# Patient Record
Sex: Female | Born: 1985 | Race: White | Hispanic: No | Marital: Single | Smoking: Never smoker
Health system: Southern US, Community
[De-identification: ages and names within clinical notes are randomized; demographics above are authoritative.]

## PROBLEM LIST (undated history)

## (undated) DIAGNOSIS — F99 Mental disorder, not otherwise specified: Secondary | ICD-10-CM

## (undated) DIAGNOSIS — O139 Gestational [pregnancy-induced] hypertension without significant proteinuria, unspecified trimester: Secondary | ICD-10-CM

## (undated) DIAGNOSIS — R569 Unspecified convulsions: Secondary | ICD-10-CM

## (undated) DIAGNOSIS — R87629 Unspecified abnormal cytological findings in specimens from vagina: Secondary | ICD-10-CM

## (undated) DIAGNOSIS — O24419 Gestational diabetes mellitus in pregnancy, unspecified control: Secondary | ICD-10-CM

## (undated) DIAGNOSIS — G43909 Migraine, unspecified, not intractable, without status migrainosus: Secondary | ICD-10-CM

## (undated) DIAGNOSIS — F32A Depression, unspecified: Secondary | ICD-10-CM

## (undated) DIAGNOSIS — F419 Anxiety disorder, unspecified: Secondary | ICD-10-CM

## (undated) DIAGNOSIS — G4730 Sleep apnea, unspecified: Secondary | ICD-10-CM

## (undated) DIAGNOSIS — N200 Calculus of kidney: Secondary | ICD-10-CM

## (undated) DIAGNOSIS — M549 Dorsalgia, unspecified: Secondary | ICD-10-CM

## (undated) HISTORY — DX: Mental disorder, not otherwise specified: F99

## (undated) HISTORY — DX: Depression, unspecified: F32.A

## (undated) HISTORY — DX: Sleep apnea, unspecified: G47.30

## (undated) HISTORY — DX: Migraine, unspecified, not intractable, without status migrainosus: G43.909

## (undated) HISTORY — DX: Unspecified abnormal cytological findings in specimens from vagina: R87.629

## (undated) HISTORY — DX: Gestational diabetes mellitus in pregnancy, unspecified control: O24.419

## (undated) HISTORY — DX: Anxiety disorder, unspecified: F41.9

## (undated) HISTORY — DX: Gestational (pregnancy-induced) hypertension without significant proteinuria, unspecified trimester: O13.9

---

## 2016-10-08 ENCOUNTER — Emergency Department (HOSPITAL_COMMUNITY)
Admission: EM | Admit: 2016-10-08 | Discharge: 2016-10-09 | Disposition: A | Discharge status: Home / Self Care | Payer: BC Managed Care – PPO | Attending: Emergency Medicine | Admitting: Emergency Medicine

## 2016-10-08 ENCOUNTER — Encounter (HOSPITAL_COMMUNITY): Payer: Self-pay | Admitting: Emergency Medicine

## 2016-10-08 DIAGNOSIS — R51 Headache: Secondary | ICD-10-CM | POA: Diagnosis not present

## 2016-10-08 DIAGNOSIS — R112 Nausea with vomiting, unspecified: Secondary | ICD-10-CM | POA: Insufficient documentation

## 2016-10-08 DIAGNOSIS — R197 Diarrhea, unspecified: Secondary | ICD-10-CM | POA: Diagnosis not present

## 2016-10-08 DIAGNOSIS — Z79899 Other long term (current) drug therapy: Secondary | ICD-10-CM | POA: Insufficient documentation

## 2016-10-08 LAB — URINE MICROSCOPIC-ADD ON

## 2016-10-08 LAB — COMPREHENSIVE METABOLIC PANEL
ALT: 24 U/L (ref 14–54)
AST: 22 U/L (ref 15–41)
Albumin: 3.1 g/dL — ABNORMAL LOW (ref 3.5–5.0)
Alkaline Phosphatase: 60 U/L (ref 38–126)
Anion gap: 8 (ref 5–15)
BUN: 16 mg/dL (ref 6–20)
CHLORIDE: 99 mmol/L — AB (ref 101–111)
CO2: 27 mmol/L (ref 22–32)
CREATININE: 0.94 mg/dL (ref 0.44–1.00)
Calcium: 8.1 mg/dL — ABNORMAL LOW (ref 8.9–10.3)
GFR calc non Af Amer: >60 mL/min (ref >60)
Glucose, Bld: 114 mg/dL — ABNORMAL HIGH (ref 65–99)
POTASSIUM: 4.1 mmol/L (ref 3.5–5.1)
SODIUM: 134 mmol/L — AB (ref 135–145)
Total Bilirubin: 0.7 mg/dL (ref 0.3–1.2)
Total Protein: 6.9 g/dL (ref 6.5–8.1)

## 2016-10-08 LAB — URINALYSIS, ROUTINE W REFLEX MICROSCOPIC
Bilirubin Urine: NEGATIVE
GLUCOSE, UA: NEGATIVE mg/dL
HGB URINE DIPSTICK: NEGATIVE
Ketones, ur: NEGATIVE mg/dL
LEUKOCYTES UA: NEGATIVE
Nitrite: POSITIVE — AB
PH: 6.5 (ref 5.0–8.0)
PROTEIN: NEGATIVE mg/dL
Specific Gravity, Urine: 1.015 (ref 1.005–1.030)

## 2016-10-08 LAB — LIPASE, BLOOD: LIPASE: 16 U/L (ref 11–51)

## 2016-10-08 LAB — PREGNANCY, URINE: Preg Test, Ur: NEGATIVE

## 2016-10-08 LAB — CBC
HEMATOCRIT: 44.5 % (ref 36.0–46.0)
Hemoglobin: 14.5 g/dL (ref 12.0–15.0)
MCH: 28.7 pg (ref 26.0–34.0)
MCHC: 32.6 g/dL (ref 30.0–36.0)
MCV: 88.1 fL (ref 78.0–100.0)
PLATELETS: 232 10*3/uL (ref 150–400)
RBC: 5.05 MIL/uL (ref 3.87–5.11)
RDW: 13.6 % (ref 11.5–15.5)
WBC: 5.9 10*3/uL (ref 4.0–10.5)

## 2016-10-08 MED ORDER — ONDANSETRON HCL 4 MG/2ML IJ SOLN
4.0000 mg | Freq: Once | INTRAMUSCULAR | Status: AC | PRN
  Administered 2016-10-08: 4 mg via INTRAVENOUS

## 2016-10-08 MED ORDER — ONDANSETRON HCL 4 MG/2ML IJ SOLN
INTRAMUSCULAR | Status: AC
  Administered 2016-10-08: 4 mg via INTRAVENOUS
  Filled 2016-10-08: qty 2

## 2016-10-08 MED ORDER — SODIUM CHLORIDE 0.9 % IV BOLUS (SEPSIS)
1000.0000 mL | Freq: Once | INTRAVENOUS | Status: AC
  Administered 2016-10-08: 1000 mL via INTRAVENOUS

## 2016-10-08 NOTE — ED Provider Notes (Signed)
[facility]-EMERGENCY DEPT Provider Note   CSN: [id number] Arrival date & time: 10/08/16  1919  By signing my name below, I, Angelica Crawford, attest that this documentation has been prepared under the direction and in the presence of Angelica Crawford, Scribe. 10/08/2016. 10:16 PM.  History   Chief Complaint Chief Complaint  Patient presents with  . Emesis   The history is provided by the patient. No language interpreter was used.  Emesis   This is a new problem. The current episode started 12 to 24 hours ago. The problem occurs continuously. The problem has not changed since onset.Maximum temperature: Subjective. The fever has been present for less than 1 day. Associated symptoms include chills, diarrhea, a fever, headaches and sweats. Pertinent negatives include no cough. Risk factors include ill contacts.    HPI Comments:  Angelica Crawford is an otherwise healthy 30 y.o. female who presents to the Emergency Department complaining of sudden-onset constant unchanged episodes of vomiting beginning 16 hours ago. Pt states that she woke up early this morning and began vomiting. She states that she tried going to work but was unable to stay due to her continued vomiting. Pt reports that she went home and went to vomit when she passed out and hit her head on the toilet seat. She has tried rest with no relief. Pt reports associated nausea, diarrhea, chills, fever, fatigue, change in appetite, and headache. She denies any bloody stool, dysuria, rhinorrhea, congestion, cough, or visual disturbance. 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    History reviewed. No pertinent past medical history.  There are no active problems to display for this patient.   History reviewed. No pertinent surgical history.  OB History    No data available       Home Medications    Prior to Admission medications   Medication Sig Start Date End Date Taking?  Authorizing Provider  LORazepam (ATIVAN) 0.5 MG tablet Take 0.5 mg by mouth daily as needed for anxiety.  09/12/16  Yes Historical Provider, Crawford    Family History Family History  Problem Relation Age of Onset  . Multiple sclerosis Mother   . Cancer Mother     Social History Social History  Substance Use Topics  . Smoking status: Never Smoker  . Smokeless tobacco: Never Used  . Alcohol use No     Allergies   Penicillins and Sulfa antibiotics   Review of Systems Review of Systems  Constitutional: Positive for appetite change, chills, fatigue and fever.  HENT: Negative for congestion and rhinorrhea.   Eyes: Negative for visual disturbance.  Respiratory: Negative for cough.   Gastrointestinal: Positive for diarrhea, nausea and vomiting. Negative for blood in stool.  Genitourinary: Negative for dysuria.  Neurological: Positive for headaches.  All other systems reviewed and are negative.    Physical Exam Updated Vital Signs BP 117/63 (BP Location: Left Arm)   Pulse 98   Temp 99.1 F (37.3 C) (Oral)   Resp 19   Ht 5\' 5"  (1.651 m)   Wt (!) 376 lb (170.6 kg)   LMP 10/03/2016   SpO2 95%   BMI 62.57 kg/m   Physical Exam  Constitutional: She appears well-developed and well-nourished. No distress.  Obesity  HENT:  Head: Normocephalic and atraumatic.  Eyes: Conjunctivae are normal.  Neck: Neck supple.  Cardiovascular: Normal rate and regular rhythm.   No murmur heard. Pulmonary/Chest: Effort normal and breath sounds normal. No respiratory distress.  Abdominal: Soft. There is no tenderness.  Musculoskeletal: She exhibits no edema.  Neurological: She is alert.  Skin: Skin is warm and dry.  Psychiatric: She has a normal mood and affect.  Nursing note and vitals reviewed.    ED Treatments / Results  DIAGNOSTIC STUDIES:  Oxygen Saturation is 95% on RA, adequate by my interpretation.    COORDINATION OF CARE:  10:24 PM Discussed treatment plan with pt at bedside  which includes nausea medication and pt agreed to plan.  Labs (all labs ordered are listed, but only abnormal results are displayed) Labs Reviewed  COMPREHENSIVE METABOLIC PANEL - Abnormal; Notable for the following:       Result Value   Sodium 134 (*)    Chloride 99 (*)    Glucose, Bld 114 (*)    Calcium 8.1 (*)    Albumin 3.1 (*)    All other components within normal limits  URINALYSIS, ROUTINE W REFLEX MICROSCOPIC (NOT AT [facility]) - Abnormal; Notable for the following:    APPearance HAZY (*)    Nitrite POSITIVE (*)    All other components within normal limits  URINE MICROSCOPIC-ADD ON - Abnormal; Notable for the following:    Squamous Epithelial / LPF 0-5 (*)    Bacteria, UA MANY (*)    All other components within normal limits  LIPASE, BLOOD  CBC  PREGNANCY, URINE    EKG  EKG Interpretation None       Radiology No results found.  Procedures Procedures (including critical care time)  Medications Ordered in ED Medications  ondansetron (ZOFRAN) injection 4 mg (4 mg Intravenous Given 10/08/16 2103)  sodium chloride 0.9 % bolus 1,000 mL (0 mLs Intravenous Stopped 10/08/16 2322)  fosfomycin (MONUROL) packet 3 g (3 g Oral Given 10/09/16 0109)  ondansetron (ZOFRAN) injection 4 mg (4 mg Intravenous Given 10/09/16 0014)     Initial Impression / Assessment and Plan / ED Course  I have reviewed the triage vital signs and the nursing notes.  Pertinent labs & imaging results that were available during my care of the patient were reviewed by me and considered in my medical decision making (see chart for details).  Clinical Course  Value Comment By Time  Pregnancy, urine (Reviewed) Angelica Brimhristopher J Prerana Strayer, Crawford 10/25 1249     Angelica Crawford is an otherwise healthy 30 y.o. female who presents to the Emergency Department complaining of Nausea, vomiting, and diarrhea.  History and exam are seen above.  Patient exam was grossly unremarkable. No abdominal tenderness. Lungs  clear.  Patient symptom still to be related to likely viral gastroenteritis however, lab testing obtained for dehydration and possible UTI given her frequent urination.  Urine revealed evidence of UTI. Normal kidney function in a leukocytosis, do not feel patient has pyelo.   Patient given fosfomycin for UTI. Patient had resolution in nausea and vomiting with Zofran.  Patient felt much better after fluids.  Patient given a prescription for Zofran and will follow up with CP in several days. Patient can return precautions for worsened dehydration or other symptoms. Patient had no other questions or concerns and was discharged in good condition.  Final Clinical Impressions(s) / ED Diagnoses   Final diagnoses:  Nausea vomiting and diarrhea    New Prescriptions Discharge Medication List as of 10/09/2016 12:04 AM    START taking these medications   Details  ondansetron (ZOFRAN) 4 MG tablet Take 1 tablet (4 mg total) by mouth every 8 (eight) hours as needed for  nausea or vomiting., Starting Wed 10/09/2016, Print       I personally performed the services described in this documentation, which was scribed in my presence. The recorded information has been reviewed and is accurate.  Clinical Impression: 1. Nausea vomiting and diarrhea     Disposition: Discharge  Condition: Good  I have discussed the results, Dx and Tx plan with the pt(& family if present). He/she/they expressed understanding and agree(s) with the plan. Discharge instructions discussed at great length. Strict return precautions discussed and pt &/or family have verbalized understanding of the instructions. No further questions at time of discharge.    Discharge Medication List as of 10/09/2016 12:04 AM    START taking these medications   Details  ondansetron (ZOFRAN) 4 MG tablet Take 1 tablet (4 mg total) by mouth every 8 (eight) hours as needed for nausea or vomiting., Starting Wed 10/09/2016, Print         Follow Up: [facility] [address] [place] [address] [phone]         Angelica Crawford 10/09/16 1251

## 2016-10-08 NOTE — ED Triage Notes (Signed)
Pt reports emesis, diarrhea and fever that started today. Pt states she "passes out when she throws up."

## 2016-10-08 NOTE — ED Notes (Signed)
MD at bedside. 

## 2016-10-09 MED ORDER — FOSFOMYCIN TROMETHAMINE 3 G PO PACK
PACK | ORAL | Status: AC
  Filled 2016-10-09: qty 3

## 2016-10-09 MED ORDER — FOSFOMYCIN TROMETHAMINE 3 G PO PACK
3.0000 g | PACK | Freq: Once | ORAL | Status: AC
  Administered 2016-10-09: 3 g via ORAL
  Filled 2016-10-09: qty 3

## 2016-10-09 MED ORDER — ONDANSETRON HCL 4 MG PO TABS: 4.0000 mg | ORAL_TABLET | Freq: Three times a day (TID) | ORAL | 0 refills | Status: DC | PRN

## 2016-10-09 MED ORDER — ONDANSETRON HCL 4 MG/2ML IJ SOLN
4.0000 mg | Freq: Once | INTRAMUSCULAR | Status: AC
  Administered 2016-10-09: 4 mg via INTRAVENOUS
  Filled 2016-10-09: qty 2

## 2016-10-09 NOTE — ED Notes (Signed)
Pt updated on delay that we are waiting on med to come from pharmacy

## 2016-10-09 NOTE — ED Notes (Signed)
Pt is waiting on abx prior to d/c

## 2017-10-06 ENCOUNTER — Other Ambulatory Visit (HOSPITAL_COMMUNITY)
Admission: RE | Admit: 2017-10-06 | Discharge: 2017-10-06 | Disposition: A | Discharge status: Home / Self Care | Payer: BC Managed Care – PPO | Source: Ambulatory Visit | Attending: Family Medicine | Admitting: Family Medicine

## 2017-10-06 ENCOUNTER — Other Ambulatory Visit: Payer: Self-pay | Admitting: Family Medicine

## 2017-10-06 DIAGNOSIS — Z124 Encounter for screening for malignant neoplasm of cervix: Secondary | ICD-10-CM | POA: Diagnosis not present

## 2017-10-08 LAB — CYTOLOGY - PAP
Chlamydia: NEGATIVE
DIAGNOSIS: NEGATIVE
HPV: NOT DETECTED
NEISSERIA GONORRHEA: NEGATIVE

## 2017-12-23 ENCOUNTER — Other Ambulatory Visit: Payer: Self-pay | Admitting: Family Medicine

## 2017-12-23 DIAGNOSIS — Z1231 Encounter for screening mammogram for malignant neoplasm of breast: Secondary | ICD-10-CM

## 2018-02-09 ENCOUNTER — Ambulatory Visit
Admission: RE | Admit: 2018-02-09 | Discharge: 2018-02-09 | Disposition: A | Discharge status: Home / Self Care | Payer: BC Managed Care – PPO | Source: Ambulatory Visit | Attending: Family Medicine | Admitting: Family Medicine

## 2018-02-09 DIAGNOSIS — Z1231 Encounter for screening mammogram for malignant neoplasm of breast: Secondary | ICD-10-CM

## 2018-02-11 ENCOUNTER — Other Ambulatory Visit: Payer: Self-pay | Admitting: Family Medicine

## 2018-02-11 DIAGNOSIS — R928 Other abnormal and inconclusive findings on diagnostic imaging of breast: Secondary | ICD-10-CM

## 2018-02-16 ENCOUNTER — Ambulatory Visit
Admission: RE | Admit: 2018-02-16 | Discharge: 2018-02-16 | Disposition: A | Discharge status: Home / Self Care | Payer: BC Managed Care – PPO | Source: Ambulatory Visit | Attending: Family Medicine | Admitting: Family Medicine

## 2018-02-16 DIAGNOSIS — R928 Other abnormal and inconclusive findings on diagnostic imaging of breast: Secondary | ICD-10-CM

## 2018-05-05 ENCOUNTER — Other Ambulatory Visit: Payer: Self-pay

## 2018-05-05 ENCOUNTER — Encounter (HOSPITAL_COMMUNITY): Payer: Self-pay | Admitting: Emergency Medicine

## 2018-05-05 ENCOUNTER — Emergency Department (HOSPITAL_COMMUNITY)
Admission: EM | Admit: 2018-05-05 | Discharge: 2018-05-05 | Disposition: A | Discharge status: Home / Self Care | Payer: BC Managed Care – PPO | Attending: Emergency Medicine | Admitting: Emergency Medicine

## 2018-05-05 ENCOUNTER — Emergency Department (HOSPITAL_COMMUNITY): Payer: BC Managed Care – PPO

## 2018-05-05 DIAGNOSIS — M461 Sacroiliitis, not elsewhere classified: Secondary | ICD-10-CM

## 2018-05-05 DIAGNOSIS — M62830 Muscle spasm of back: Secondary | ICD-10-CM | POA: Diagnosis not present

## 2018-05-05 DIAGNOSIS — M545 Low back pain: Secondary | ICD-10-CM | POA: Diagnosis present

## 2018-05-05 LAB — POC URINE PREG, ED: PREG TEST UR: NEGATIVE

## 2018-05-05 MED ORDER — PROMETHAZINE HCL 12.5 MG PO TABS
12.5000 mg | ORAL_TABLET | Freq: Once | ORAL | Status: AC
  Administered 2018-05-05: 12.5 mg via ORAL
  Filled 2018-05-05: qty 1

## 2018-05-05 MED ORDER — HYDROCODONE-ACETAMINOPHEN 5-325 MG PO TABS
2.0000 | ORAL_TABLET | Freq: Once | ORAL | Status: AC
Start: 2018-05-05 — End: 2018-05-05
  Administered 2018-05-05: 2 via ORAL
  Filled 2018-05-05: qty 2

## 2018-05-05 MED ORDER — HYDROCODONE-ACETAMINOPHEN 5-325 MG PO TABS: 1.0000 | ORAL_TABLET | ORAL | 0 refills | Status: DC | PRN

## 2018-05-05 MED ORDER — METHOCARBAMOL 500 MG PO TABS
500.0000 mg | ORAL_TABLET | Freq: Once | ORAL | Status: AC
  Administered 2018-05-05: 500 mg via ORAL
  Filled 2018-05-05: qty 1

## 2018-05-05 MED ORDER — HYDROMORPHONE HCL 1 MG/ML IJ SOLN
1.0000 mg | Freq: Once | INTRAMUSCULAR | Status: AC
  Administered 2018-05-05: 1 mg via INTRAMUSCULAR
  Filled 2018-05-05: qty 1

## 2018-05-05 MED ORDER — DEXAMETHASONE 4 MG PO TABS: 4.0000 mg | ORAL_TABLET | Freq: Two times a day (BID) | ORAL | 0 refills | Status: DC

## 2018-05-05 MED ORDER — ONDANSETRON HCL 4 MG PO TABS
4.0000 mg | ORAL_TABLET | Freq: Once | ORAL | Status: AC
  Administered 2018-05-05: 4 mg via ORAL
  Filled 2018-05-05: qty 1

## 2018-05-05 MED ORDER — DIAZEPAM 5 MG PO TABS: 5.0000 mg | ORAL_TABLET | Freq: Three times a day (TID) | ORAL | 0 refills | Status: DC

## 2018-05-05 MED ORDER — DICLOFENAC SODIUM 75 MG PO TBEC: 75.0000 mg | DELAYED_RELEASE_TABLET | Freq: Two times a day (BID) | ORAL | 0 refills | Status: DC

## 2018-05-05 MED ORDER — PREDNISONE 20 MG PO TABS
40.0000 mg | ORAL_TABLET | Freq: Once | ORAL | Status: AC
  Administered 2018-05-05: 40 mg via ORAL
  Filled 2018-05-05: qty 2

## 2018-05-05 MED ORDER — DIAZEPAM 5 MG PO TABS
10.0000 mg | ORAL_TABLET | Freq: Once | ORAL | Status: AC
  Administered 2018-05-05: 10 mg via ORAL
  Filled 2018-05-05: qty 2

## 2018-05-05 NOTE — ED Triage Notes (Signed)
Pt c/o of lower back pain. Denies any new injuries or urinary symptoms.

## 2018-05-05 NOTE — ED Provider Notes (Signed)
[facility] EMERGENCY DEPARTMENT Provider Note   CSN: [id number] Arrival date & time: 05/05/18  1816     History   Chief Complaint Chief Complaint  Patient presents with  . Back Pain    HPI Angelica Crawford is a 32 y.o. female.  Patient is a 32 year old female who presents to the emergency department with a complaint of severe back pain.  The patient states that on yesterday she bent over to pick up a marker.  She had a immediate pain of the lower back.  She was seen at the emergency department at [facility], had a plain x-ray film.  She was told that they did not see any acute abnormality on the plain film.  The patient was placed on a muscle relaxer and an anti-inflammatory medication.  The patient states that the pain is gotten progressively worse throughout the day.  She says now the pain is nearly unbearable.  She has pain with even little movements.  She has not had any loss of bowel or bladder function.  She denies any problem with sensation in the saddle area or the private area.  She presents now for assistance with her pain and for additional evaluation concerning her back.     History reviewed. No pertinent past medical history.  There are no active problems to display for this patient.   History reviewed. No pertinent surgical history.   OB History   None      Home Medications    Prior to Admission medications   Medication Sig Start Date End Date Taking? Authorizing Provider  dexamethasone (DECADRON) 4 MG tablet Take 1 tablet (4 mg total) by mouth 2 (two) times daily with a meal. 05/05/18   Ivery Quale, PA-C  diazepam (VALIUM) 5 MG tablet Take 1 tablet (5 mg total) by mouth 3 (three) times daily. 05/05/18   Ivery Quale, PA-C  diclofenac (VOLTAREN) 75 MG EC tablet Take 1 tablet (75 mg total) by mouth 2 (two) times daily. 05/05/18   Ivery Quale, PA-C  HYDROcodone-acetaminophen (NORCO/VICODIN) 5-325 MG tablet Take 1 tablet by mouth every 4 (four) hours  as needed. 05/05/18   Ivery Quale, PA-C  LORazepam (ATIVAN) 0.5 MG tablet Take 0.5 mg by mouth daily as needed for anxiety.  09/12/16   [provider]  ondansetron (ZOFRAN) 4 MG tablet Take 1 tablet (4 mg total) by mouth every 8 (eight) hours as needed for nausea or vomiting. 10/09/16   Tegeler, Canary Brim, MD    Family History Family History  Problem Relation Age of Onset  . Multiple sclerosis Mother   . Cancer Mother   . Breast cancer Mother     Social History Social History   Tobacco Use  . Smoking status: Never Smoker  . Smokeless tobacco: Never Used  Substance Use Topics  . Alcohol use: No  . Drug use: No     Allergies   Penicillins and Sulfa antibiotics   Review of Systems Review of Systems  Constitutional: Negative for activity change.       All ROS Neg except as noted in HPI  HENT: Negative for nosebleeds.   Eyes: Negative for photophobia and discharge.  Respiratory: Negative for cough, shortness of breath and wheezing.   Cardiovascular: Negative for chest pain and palpitations.  Gastrointestinal: Negative for abdominal pain and blood in stool.  Genitourinary: Negative for dysuria, frequency and hematuria.  Musculoskeletal: Positive for back pain. Negative for arthralgias and neck pain.  Skin: Negative.   Neurological:  Negative for dizziness, seizures and speech difficulty.  Psychiatric/Behavioral: Negative for confusion and hallucinations.     Physical Exam Updated Vital Signs BP (!) 132/94 (BP Location: Right Arm)   Pulse 91   Temp 98.6 F (37 C) (Oral)   Resp 17   Ht  (1.676 m)   Wt (!) 141.5 kg (312 lb)   SpO2 100%   BMI 50.36 kg/m   Physical Exam  Constitutional: She is oriented to person, place, and time. She appears well-developed and well-nourished.  Non-toxic appearance.  HENT:  Head: Normocephalic.  Right Ear: Tympanic membrane and external ear normal.  Left Ear: Tympanic membrane and external ear normal.  Eyes:  Pupils are equal, round, and reactive to light. EOM and lids are normal.  Neck: Normal range of motion. Neck supple. Carotid bruit is not present.  Cardiovascular: Normal rate, regular rhythm, normal heart sounds, intact distal pulses and normal pulses.  Pulmonary/Chest: Breath sounds normal. No respiratory distress.  Abdominal: Soft. Bowel sounds are normal. There is no tenderness. There is no guarding.  Musculoskeletal: Normal range of motion.       Lumbar back: She exhibits pain and spasm.       Back:  Lymphadenopathy:       Head (right side): No submandibular adenopathy present.       Head (left side): No submandibular adenopathy present.    She has no cervical adenopathy.  Neurological: She is alert and oriented to person, place, and time. She has normal strength. No cranial nerve deficit or sensory deficit.  Skin: Skin is warm and dry.  Psychiatric: She has a normal mood and affect. Her speech is normal.  Nursing note and vitals reviewed.    ED Treatments / Results  Labs (all labs ordered are listed, but only abnormal results are displayed) Labs Reviewed  POC URINE PREG, ED    EKG None  Radiology Ct Lumbar Spine Wo Contrast  Result Date: 05/05/2018 CLINICAL DATA:  Patient bent over yesterday and has had back pain since. Pain is all over but worse involving the left buttocks and radiating down the front of both thighs. EXAM: CT LUMBAR SPINE WITHOUT CONTRAST TECHNIQUE: Multidetector CT imaging of the lumbar spine was performed without intravenous contrast administration. Multiplanar CT image reconstructions were also generated. COMPARISON:  Radiographs from 05/04/2018 FINDINGS: Segmentation: 5 lumbar type vertebrae. Alignment: Normal. Vertebrae: No acute fracture or focal pathologic process. No pars defects or listhesis. Sclerosis of both SI joints compatible sacroiliitis. Paraspinal and other soft tissues: No retroperitoneal adenopathy. Aorta is normal in caliber. No  nephrolithiasis nor hydroureteronephrosis. Disc levels: T11-12: Negative for disc herniation, canal stenosis and significant foraminal stenosis. T12-L1:  Negative L1-L2:  Negative L2-L3:  Negative L3-L4: Minimal central disc bulge without significant foraminal or central canal stenosis. L4-L5: Minimal central disc bulge without significant foraminal or central canal stenosis. L5-S1:  Negative IMPRESSION: 1. Sclerosis about both sacroiliac joints suspicious for changes of sacroiliitis and/or osteoarthritis. 2. Mild central disc bulges L3-4 and L4-5 without focal disc herniation or canal stenosis. 3. No acute lumbar fracture or suspicious osseous lesions. Electronically Signed   By: Tollie Eth M.D.   On: 05/05/2018 20:49    Procedures Procedures (including critical care time)  Medications Ordered in ED Medications  HYDROmorphone (DILAUDID) injection 1 mg (1 mg Intramuscular Given 05/05/18 2002)  methocarbamol (ROBAXIN) tablet 500 mg (500 mg Oral Given 05/05/18 2002)  promethazine (PHENERGAN) tablet 12.5 mg (12.5 mg Oral Given 05/05/18 2002)  diazepam (VALIUM)  tablet 10 mg (10 mg Oral Given 05/05/18 2123)  predniSONE (DELTASONE) tablet 40 mg (40 mg Oral Given 05/05/18 2123)  HYDROcodone-acetaminophen (NORCO/VICODIN) 5-325 MG per tablet 2 tablet (2 tablets Oral Given 05/05/18 2123)  ondansetron (ZOFRAN) tablet 4 mg (4 mg Oral Given 05/05/18 2123)     Initial Impression / Assessment and Plan / ED Course  I have reviewed the triage vital signs and the nursing notes.  Pertinent labs & imaging results that were available during my care of the patient were reviewed by me and considered in my medical decision making (see chart for details).     Final Clinical Impressions(s) / ED Diagnoses MDm  Vital signs reviewed.  Patient having increasing lower back pain.  No gross neurologic deficit appreciated on examination.  Patient has pain with walking and with most movement.  Patient reports she had a plain  x-ray film done at another emergency facility on yesterday and findings were noted.  Patient received a CT scan of the lumbar spine.  This is suggest sacroiliitis, and degenerative disc disease involving C3-C4 and C4-C5.  I discussed the findings with the patient in terms which she understands.  Pain is improving, but not resolved after IM pain medication.  Recheck reveals no gross neurologic deficits appreciated.  Prescription for Valium 3 times daily for spasm pain, Decadron and diclofenac 2 times daily with food, and Norco every 4 hours as needed for severe pain given to the patient.  Patient is to follow-up with orthopedics.  Questions were answered.  Feel that it is safe for the patient to be discharged home.   Final diagnoses:  Bilateral sacroiliitis (HCC)  Muscle spasm of back    ED Discharge Orders        Ordered    diazepam (VALIUM) 5 MG tablet  3 times daily     05/05/18 2126    HYDROcodone-acetaminophen (NORCO/VICODIN) 5-325 MG tablet  Every 4 hours PRN     05/05/18 2126    dexamethasone (DECADRON) 4 MG tablet  2 times daily with meals     05/05/18 2126    diclofenac (VOLTAREN) 75 MG EC tablet  2 times daily     05/05/18 2126       Ivery Quale, PA-C 05/06/18 Rondel Jumbo, MD 05/06/18 0040

## 2018-05-05 NOTE — Discharge Instructions (Addendum)
Your CT scan suggest sacroiliitis or inflammation of the sacroiliac joints.  You have a bulging disc at the L3-L4, and L4-L5 area.  No herniated disc appreciated on the CT scan. Please call Dr Carola Frost for orthopedic evaluation of your back. Your exam suggest muscle spasm of the lower back area.  Please use a heating pad to the area.  Please rest her back is much as possible.  Use Decadron and diclofenac 2 times daily with food.  Use Valium 3 times daily for the spasm pains.  May use Norco for more severe pain.  Norco and Valium may cause drowsiness.  Please use these medications with caution.  Please see Dr. Marcello Fennel or a member of his team as soon as possible concerning your back pain.

## 2018-05-09 ENCOUNTER — Other Ambulatory Visit: Payer: Self-pay

## 2018-05-09 ENCOUNTER — Emergency Department (HOSPITAL_COMMUNITY)
Admission: EM | Admit: 2018-05-09 | Discharge: 2018-05-09 | Disposition: A | Discharge status: Home / Self Care | Payer: No Typology Code available for payment source | Attending: Emergency Medicine | Admitting: Emergency Medicine

## 2018-05-09 ENCOUNTER — Encounter (HOSPITAL_COMMUNITY): Payer: Self-pay | Admitting: Emergency Medicine

## 2018-05-09 DIAGNOSIS — M5442 Lumbago with sciatica, left side: Secondary | ICD-10-CM | POA: Diagnosis not present

## 2018-05-09 DIAGNOSIS — M545 Low back pain: Secondary | ICD-10-CM | POA: Diagnosis present

## 2018-05-09 DIAGNOSIS — M5441 Lumbago with sciatica, right side: Secondary | ICD-10-CM | POA: Insufficient documentation

## 2018-05-09 MED ORDER — DIAZEPAM 5 MG PO TABS
5.0000 mg | ORAL_TABLET | Freq: Once | ORAL | Status: AC
  Administered 2018-05-09: 5 mg via ORAL
  Filled 2018-05-09: qty 1

## 2018-05-09 MED ORDER — HYDROMORPHONE HCL 1 MG/ML IJ SOLN
1.0000 mg | Freq: Once | INTRAMUSCULAR | Status: AC
  Administered 2018-05-09: 1 mg via INTRAMUSCULAR
  Filled 2018-05-09: qty 1

## 2018-05-09 MED ORDER — HYDROCODONE-ACETAMINOPHEN 5-325 MG PO TABS: 1.0000 | ORAL_TABLET | ORAL | 0 refills | Status: DC | PRN

## 2018-05-09 MED ORDER — DIAZEPAM 5 MG PO TABS: 5.0000 mg | ORAL_TABLET | Freq: Three times a day (TID) | ORAL | 0 refills | Status: DC | PRN

## 2018-05-09 MED ORDER — ONDANSETRON 4 MG PO TBDP
4.0000 mg | ORAL_TABLET | Freq: Once | ORAL | Status: AC
  Administered 2018-05-09: 4 mg via ORAL
  Filled 2018-05-09: qty 1

## 2018-05-09 NOTE — ED Triage Notes (Signed)
Pt reports continued lower back pain that worsens with movement. Pt evaluated a few days ago for same complaint.

## 2018-05-09 NOTE — Discharge Instructions (Addendum)
Use the medicines as directed.  Do not drive within 4 hours of taking hydrocodone as this will make you drowsy as will valium.  Avoid lifting,  Bending,  Twisting or any other activity that worsens your pain over the next week.  Apply a heating pad for 20 minutes 3-4 times daily which can help your back heal and help reduce any muscle spasm.  You should get rechecked if your symptoms are not better over the next 5 days,  Or you develop increased pain,  Weakness in your leg(s) or loss of bladder or bowel function - these are symptoms of a worse injury.

## 2018-05-09 NOTE — ED Provider Notes (Signed)
[facility] EMERGENCY DEPARTMENT Provider Note   CSN: [id number] Arrival date & time: 05/09/18  1516     History   Chief Complaint Chief Complaint  Patient presents with  . Back Pain    HPI Angelica Crawford is a 32 y.o. female presenting with request for pain control for her lower back.  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  She underwent mri at the ortho office yesterday and has followup in 3 days there for further management.  She placed on hydrocodone, valium and voltaren  Moderate improvement of pain when here, but has run out of these medicines.  She has radiation of pain into her anterior bilateral upper thighs.  There has been no weakness or numbness in the lower extremities and no urinary or bowel retention or incontinence.  Patient does not have a history of cancer or IVDU.     The history is provided by the patient.    History reviewed. No pertinent past medical history.  There are no active problems to display for this patient.   History reviewed. No pertinent surgical history.   OB History   None      Home Medications    Prior to Admission medications   Medication Sig Start Date End Date Taking? Authorizing Provider  dexamethasone (DECADRON) 4 MG tablet Take 1 tablet (4 mg total) by mouth 2 (two) times daily with a meal. 05/05/18   Ivery Quale, PA-C  diazepam (VALIUM) 5 MG tablet Take 1 tablet (5 mg total) by mouth every 8 (eight) hours as needed for muscle spasms. 05/09/18   Burgess Amor, PA-C  diclofenac (VOLTAREN) 75 MG EC tablet Take 1 tablet (75 mg total) by mouth 2 (two) times daily. 05/05/18   Ivery Quale, PA-C  HYDROcodone-acetaminophen (NORCO/VICODIN) 5-325 MG tablet Take 1-2 tablets by mouth every 4 (four) hours as needed. 05/09/18   Burgess Amor, PA-C  LORazepam (ATIVAN) 0.5 MG tablet  Take 0.5 mg by mouth daily as needed for anxiety.  09/12/16   [provider]  ondansetron (ZOFRAN) 4 MG tablet Take 1 tablet (4 mg total) by mouth every 8 (eight) hours as needed for nausea or vomiting. 10/09/16   Tegeler, Canary Brim, MD    Family History Family History  Problem Relation Age of Onset  . Multiple sclerosis Mother   . Cancer Mother   . Breast cancer Mother     Social History Social History   Tobacco Use  . Smoking status: Never Smoker  . Smokeless tobacco: Never Used  Substance Use Topics  . Alcohol use: No  . Drug use: No     Allergies   Penicillins and Sulfa antibiotics   Review of Systems Review of Systems  Constitutional: Negative for fever.  Respiratory: Negative for shortness of breath.   Cardiovascular: Negative for chest pain and leg swelling.  Gastrointestinal: Negative for abdominal distention, abdominal pain and constipation.  Genitourinary: Negative for difficulty urinating, dysuria, flank pain, frequency and urgency.  Musculoskeletal: Positive for back pain. Negative for gait problem and joint swelling.  Skin: Negative for rash.  Neurological: Negative for weakness and numbness.     Physical Exam Updated Vital Signs BP (!) 129/92 (BP Location: Right Arm)   Pulse (!) 118   Temp 98.5 F (36.9 C) (Oral)   Resp 20  Ht  (1.651 m)   Wt (!) 141.5 kg (312 lb)   LMP 05/03/2018 (Exact Date)   SpO2 100%   BMI 51.92 kg/m   Physical Exam  Constitutional: She appears well-developed and well-nourished.  HENT:  Head: Normocephalic.  Eyes: Conjunctivae are normal.  Neck: Normal range of motion. Neck supple.  Cardiovascular: Normal rate and intact distal pulses.  Pedal pulses normal.  Pulmonary/Chest: Effort normal.  Abdominal: Soft. Bowel sounds are normal. She exhibits no distension and no mass.  Musculoskeletal: Normal range of motion. She exhibits no edema.       Lumbar back: She exhibits tenderness. She exhibits no  swelling, no edema and no spasm.       Back:  Neurological: She is alert. She has normal strength. She displays no atrophy and no tremor. No sensory deficit. Gait normal.  Reflex Scores:      Patellar reflexes are 2+ on the right side and 2+ on the left side.      Achilles reflexes are 2+ on the right side and 2+ on the left side. No strength deficit noted in hip and knee flexor and extensor muscle groups.  Ankle flexion and extension intact.  Skin: Skin is warm and dry.  Psychiatric: She has a normal mood and affect.  Nursing note and vitals reviewed.    ED Treatments / Results  Labs (all labs ordered are listed, but only abnormal results are displayed) Labs Reviewed - No data to display  EKG None  Radiology No results found.  Procedures Procedures (including critical care time)  Medications Ordered in ED Medications  diazepam (VALIUM) tablet 5 mg (5 mg Oral Given 05/09/18 1617)  HYDROmorphone (DILAUDID) injection 1 mg (1 mg Intramuscular Given 05/09/18 1618)  ondansetron (ZOFRAN-ODT) disintegrating tablet 4 mg (4 mg Oral Given 05/09/18 1617)     Initial Impression / Assessment and Plan / ED Course  I have reviewed the triage vital signs and the nursing notes.  Pertinent labs & imaging results that were available during my care of the patient were reviewed by me and considered in my medical decision making (see chart for details).     Patient walking very slowly with forward flexion at waist upon first arrival.  Tearful.  Given dilaudid injection, valium tablet with moderate pain relief. She was prescribed a few more hydrocodone tablets, valium until she can be seen by her orthopedist in 3 days. Advised to ask her orthopedist for further pain management as needed.   No neuro deficit on exam or by history to suggest emergent or surgical presentation.  Also discussed worsened sx that should prompt immediate re-evaluation including distal weakness, bowel/bladder  retention/incontinence.        Final Clinical Impressions(s) / ED Diagnoses   Final diagnoses:  Acute bilateral low back pain with bilateral sciatica    ED Discharge Orders        Ordered    diazepam (VALIUM) 5 MG tablet  Every 8 hours PRN     05/09/18 1718    HYDROcodone-acetaminophen (NORCO/VICODIN) 5-325 MG tablet  Every 4 hours PRN     05/09/18 1718       Burgess Amor, PA-C 05/10/18 1719    Derwood Kaplan, MD 05/11/18 0023

## 2018-05-16 ENCOUNTER — Other Ambulatory Visit: Payer: Self-pay

## 2018-05-16 ENCOUNTER — Encounter (HOSPITAL_COMMUNITY): Payer: Self-pay | Admitting: Emergency Medicine

## 2018-05-16 ENCOUNTER — Emergency Department (HOSPITAL_COMMUNITY)
Admission: EM | Admit: 2018-05-16 | Discharge: 2018-05-16 | Disposition: A | Discharge status: Home / Self Care | Payer: No Typology Code available for payment source | Attending: Emergency Medicine | Admitting: Emergency Medicine

## 2018-05-16 DIAGNOSIS — Z79899 Other long term (current) drug therapy: Secondary | ICD-10-CM | POA: Diagnosis not present

## 2018-05-16 DIAGNOSIS — M545 Low back pain: Secondary | ICD-10-CM | POA: Diagnosis present

## 2018-05-16 DIAGNOSIS — M5441 Lumbago with sciatica, right side: Secondary | ICD-10-CM | POA: Insufficient documentation

## 2018-05-16 DIAGNOSIS — M5442 Lumbago with sciatica, left side: Secondary | ICD-10-CM | POA: Insufficient documentation

## 2018-05-16 MED ORDER — HYDROMORPHONE HCL 1 MG/ML IJ SOLN
1.0000 mg | Freq: Once | INTRAMUSCULAR | Status: AC
  Administered 2018-05-16: 1 mg via INTRAMUSCULAR
  Filled 2018-05-16: qty 1

## 2018-05-16 NOTE — ED Notes (Signed)
Pt went to rehab on Thursday  Extreme pain yesterday-  Did not call rehab  Did not call ortho  Here today tearful with pain

## 2018-05-16 NOTE — ED Notes (Signed)
Pt when told she is getting injection  Burst out in laughter and smiles  She is no longer tearful, and asks how much med she is getting  When told dilaudid 1 mg she is almost elated and continues with her laughter and smiles  Pt education: Narcotics can lead to addiction as well as constipation and need to follow with her oncall physician for pain management

## 2018-05-16 NOTE — ED Triage Notes (Signed)
Patient reports continuing back pain since May. No problems with bowel or bladder.

## 2018-05-16 NOTE — Discharge Instructions (Signed)
Alternate ice and heat to your lower back.  Follow-up with your orthopedic provider on Monday.

## 2018-05-17 ENCOUNTER — Emergency Department (HOSPITAL_COMMUNITY)
Admission: EM | Admit: 2018-05-17 | Discharge: 2018-05-17 | Disposition: A | Discharge status: Home / Self Care | Payer: No Typology Code available for payment source | Attending: Emergency Medicine | Admitting: Emergency Medicine

## 2018-05-17 ENCOUNTER — Encounter (HOSPITAL_COMMUNITY): Payer: Self-pay | Admitting: *Deleted

## 2018-05-17 DIAGNOSIS — M545 Low back pain: Secondary | ICD-10-CM | POA: Diagnosis not present

## 2018-05-17 DIAGNOSIS — Z5321 Procedure and treatment not carried out due to patient leaving prior to being seen by health care provider: Secondary | ICD-10-CM | POA: Insufficient documentation

## 2018-05-17 HISTORY — DX: Dorsalgia, unspecified: M54.9

## 2018-05-17 NOTE — ED Notes (Signed)
Pt seen in FT yesterday by TT, PA  She was tearful until asking and hearing that she would receive dilaudid  She then became elated and stated her pain decreased less than 5 minutes after injection

## 2018-05-17 NOTE — ED Provider Notes (Signed)
[facility] EMERGENCY DEPARTMENT Provider Note   CSN: [id number] Arrival date & time: 05/16/18  1230     History   Chief Complaint Chief Complaint  Patient presents with  . Back Pain    HPI Angelica Crawford is a 32 y.o. female.  HPI  Angelica Crawford is a 32 y.o. female who presents to the Emergency Department complaining of persistent low back pain and pain radiating into both hips and thighs and down the left leg to the level of her foot.  She has been evaluated here two times and by her orthopedic provider for same.  Pain began after bending over the pick up a marker on the floor and felt a sharp pain to her lower back in mid May.  Imaging revealed disc bulges at L3-4 and 4-5.  States she has completed a course of steroids, muscle relaxer and hydrocodone, her orthopedic provider initiated PT which she states has made her pain worse.  She denies abdominal pain, fever, numbness or weakness of the lower extremities, urine of bowel changes.    Past Medical History:  Diagnosis Date  . Back pain     There are no active problems to display for this patient.   History reviewed. No pertinent surgical history.   OB History   None      Home Medications    Prior to Admission medications   Medication Sig Start Date End Date Taking? Authorizing Provider  diazepam (VALIUM) 5 MG tablet Take 1 tablet (5 mg total) by mouth every 8 (eight) hours as needed for muscle spasms. 05/09/18  Yes Idol, Raynelle FanningJulie, PA-C  HYDROcodone-acetaminophen (NORCO/VICODIN) 5-325 MG tablet Take 1-2 tablets by mouth every 4 (four) hours as needed. 05/09/18  Yes Idol, Raynelle FanningJulie, PA-C  LORazepam (ATIVAN) 0.5 MG tablet Take 0.5 mg by mouth daily as needed for anxiety.  09/12/16  Yes [provider]  dexamethasone (DECADRON) 4 MG tablet Take 1 tablet (4 mg total) by mouth 2 (two) times daily with a meal. Patient not taking: Reported on 05/16/2018 05/05/18   Ivery QualeBryant, Hobson, PA-C  diclofenac (VOLTAREN) 75 MG EC tablet  Take 1 tablet (75 mg total) by mouth 2 (two) times daily. Patient not taking: Reported on 05/16/2018 05/05/18   Ivery QualeBryant, Hobson, PA-C    Family History Family History  Problem Relation Age of Onset  . Multiple sclerosis Mother   . Cancer Mother   . Breast cancer Mother     Social History Social History   Tobacco Use  . Smoking status: Never Smoker  . Smokeless tobacco: Never Used  Substance Use Topics  . Alcohol use: No  . Drug use: No     Allergies   Penicillins and Sulfa antibiotics   Review of Systems Review of Systems  Constitutional: Negative for fever.  Respiratory: Negative for shortness of breath.   Gastrointestinal: Negative for abdominal pain, constipation and vomiting.  Genitourinary: Negative for decreased urine volume, difficulty urinating, dysuria, flank pain and hematuria.  Musculoskeletal: Positive for back pain. Negative for joint swelling.  Skin: Negative for rash.  Neurological: Negative for weakness and numbness.  All other systems reviewed and are negative.    Physical Exam Updated Vital Signs BP 132/70 (BP Location: Right Arm)   Pulse (!) 113   Temp 97.9 F (36.6 C) (Oral)   Resp 18   Ht 5\' 5"  (1.651 m)   Wt (!) 141.5 kg (312 lb)   LMP 05/03/2018 (Exact Date)   SpO2 98%  BMI 51.92 kg/m   Physical Exam  Constitutional: She is oriented to person, place, and time. She appears well-developed and well-nourished. No distress.  HENT:  Head: Normocephalic.  Neck: Normal range of motion. Neck supple.  Cardiovascular: Normal rate, regular rhythm and intact distal pulses.  DP pulses are strong and palpable bilaterally  Pulmonary/Chest: Effort normal and breath sounds normal. No respiratory distress.  Abdominal: Soft. She exhibits no distension. There is no tenderness.  Musculoskeletal: She exhibits tenderness. She exhibits no edema.       Lumbar back: She exhibits tenderness and pain. She exhibits normal range of motion, no swelling, no  deformity, no laceration and normal pulse.  ttp of the  Lower lumbar spine and bilateral lumbar paraspinal muscles.  Pt has 5/5 strength against resistance of bilateral lower extremities. Hip flexors and extensors intact.       Neurological: She is alert and oriented to person, place, and time. She has normal strength. No sensory deficit. She exhibits normal muscle tone. Coordination and gait normal.  Reflex Scores:      Patellar reflexes are 2+ on the right side and 2+ on the left side.      Achilles reflexes are 2+ on the right side and 2+ on the left side. Skin: Skin is warm and dry. Capillary refill takes less than 2 seconds. No rash noted.  Psychiatric: She has a normal mood and affect.  Nursing note and vitals reviewed.    ED Treatments / Results  Labs (all labs ordered are listed, but only abnormal results are displayed) Labs Reviewed - No data to display  EKG None  Radiology No results found.  Procedures Procedures (including critical care time)  Medications Ordered in ED Medications  HYDROmorphone (DILAUDID) injection 1 mg (1 mg Intramuscular Given 05/16/18 1420)     Initial Impression / Assessment and Plan / ED Course  I have reviewed the triage vital signs and the nursing notes.  Pertinent labs & imaging results that were available during my care of the patient were reviewed by me and considered in my medical decision making (see chart for details).     Pt with low back pain.  Requesting stronger pain medications.  Two previous ED visits for same, also seen 3 days ago by orthopedics for same.  Reviewed on the database and found to have been prescribed # 75 hydrocodone 5/325 mg within 10 days.    Reviewed CT imaging from previous visit and confirmed disc bulges w/o herniation.  She is ambulatory, gait steady,  No focal neuro deficits. No concerning sx's for cauda equina or infectious process.  Will address pain here, but advised pt that additional prescriptions are  not indicated.  She verbalized understanding and agrees to plan.    Final Clinical Impressions(s) / ED Diagnoses   Final diagnoses:  Acute bilateral low back pain with bilateral sciatica    ED Discharge Orders    None       Pauline Aus, PA-C 05/17/18 1958    Samuel Jester, DO 05/20/18 1235

## 2018-05-17 NOTE — ED Triage Notes (Signed)
Pt informed registration that pt is leaving due to wait time.

## 2018-05-17 NOTE — ED Triage Notes (Signed)
Pt here for continued lower back pain.  States medications at home are not helping.

## 2018-06-11 ENCOUNTER — Encounter (HOSPITAL_COMMUNITY): Payer: Self-pay | Admitting: Emergency Medicine

## 2018-06-11 ENCOUNTER — Emergency Department (HOSPITAL_COMMUNITY)
Admission: EM | Admit: 2018-06-11 | Discharge: 2018-06-11 | Disposition: A | Discharge status: Home / Self Care | Payer: No Typology Code available for payment source | Attending: Emergency Medicine | Admitting: Emergency Medicine

## 2018-06-11 ENCOUNTER — Other Ambulatory Visit: Payer: Self-pay

## 2018-06-11 DIAGNOSIS — Z79899 Other long term (current) drug therapy: Secondary | ICD-10-CM | POA: Diagnosis not present

## 2018-06-11 DIAGNOSIS — M5442 Lumbago with sciatica, left side: Secondary | ICD-10-CM | POA: Insufficient documentation

## 2018-06-11 DIAGNOSIS — G8929 Other chronic pain: Secondary | ICD-10-CM

## 2018-06-11 DIAGNOSIS — M545 Low back pain: Secondary | ICD-10-CM | POA: Diagnosis present

## 2018-06-11 MED ORDER — PREDNISONE 10 MG PO TABS
60.0000 mg | ORAL_TABLET | Freq: Once | ORAL | Status: AC
  Administered 2018-06-11: 60 mg via ORAL
  Filled 2018-06-11: qty 1

## 2018-06-11 MED ORDER — HYDROMORPHONE HCL 1 MG/ML IJ SOLN
1.0000 mg | Freq: Once | INTRAMUSCULAR | Status: AC
  Administered 2018-06-11: 1 mg via INTRAMUSCULAR
  Filled 2018-06-11: qty 1

## 2018-06-11 MED ORDER — CYCLOBENZAPRINE HCL 10 MG PO TABS: 10.0000 mg | ORAL_TABLET | Freq: Three times a day (TID) | ORAL | 0 refills | Status: DC | PRN

## 2018-06-11 MED ORDER — PREDNISONE 10 MG PO TABS: ORAL_TABLET | ORAL | 0 refills | Status: DC

## 2018-06-11 NOTE — ED Triage Notes (Signed)
Pt c/o of lower back pain that has worsened today. Denies any urinary symptoms

## 2018-06-11 NOTE — ED Provider Notes (Signed)
[facility] EMERGENCY DEPARTMENT Provider Note   CSN: [id number] Arrival date & time: 06/11/18  1700     History   Chief Complaint Chief Complaint  Patient presents with  . Back Pain    HPI Angelica Crawford is a 32 y.o. female.  HPI   Angelica Crawford is a 32 y.o. female with history of low back pain, presents to the Emergency Department complaining of increasing pain to her lower back for 1 day.  She is currently under orthopedic care for pain to her lower back that is now radiating down her left leg for several weeks.  She states she is currently undergoing physical therapy and had her last session yesterday when she felt an increased pain across her lower back.  She states the pain is similar to previous, but increased in severity.  She is tried taking her prescribed pain medication without relief.  She denies recent fall, abdominal pain, fever or chills, urine or bowel changes, and numbness or weakness of the lower extremities.  She states she is scheduled for a surgical procedure to her lower back in mid July.   Past Medical History:  Diagnosis Date  . Back pain     There are no active problems to display for this patient.   History reviewed. No pertinent surgical history.   OB History   None      Home Medications    Prior to Admission medications   Medication Sig Start Date End Date Taking? Authorizing Provider  dexamethasone (DECADRON) 4 MG tablet Take 1 tablet (4 mg total) by mouth 2 (two) times daily with a meal. Patient not taking: Reported on 05/16/2018 05/05/18   Ivery Quale, PA-C  diazepam (VALIUM) 5 MG tablet Take 1 tablet (5 mg total) by mouth every 8 (eight) hours as needed for muscle spasms. 05/09/18   Burgess Amor, PA-C  diclofenac (VOLTAREN) 75 MG EC tablet Take 1 tablet (75 mg total) by mouth 2 (two) times daily. Patient not taking: Reported on 05/16/2018 05/05/18   Ivery Quale, PA-C  HYDROcodone-acetaminophen (NORCO/VICODIN) 5-325 MG tablet Take  1-2 tablets by mouth every 4 (four) hours as needed. 05/09/18   Burgess Amor, PA-C  LORazepam (ATIVAN) 0.5 MG tablet Take 0.5 mg by mouth daily as needed for anxiety.  09/12/16   [provider]    Family History Family History  Problem Relation Age of Onset  . Multiple sclerosis Mother   . Cancer Mother   . Breast cancer Mother     Social History Social History   Tobacco Use  . Smoking status: Never Smoker  . Smokeless tobacco: Never Used  Substance Use Topics  . Alcohol use: No  . Drug use: No     Allergies   Penicillins and Sulfa antibiotics   Review of Systems Review of Systems  Constitutional: Negative for fever.  Respiratory: Negative for shortness of breath.   Gastrointestinal: Negative for abdominal pain, constipation and vomiting.  Genitourinary: Negative for decreased urine volume, difficulty urinating, dysuria and flank pain.  Musculoskeletal: Positive for back pain. Negative for joint swelling and neck pain.  Skin: Negative for rash.  Neurological: Negative for weakness and numbness.  All other systems reviewed and are negative.    Physical Exam Updated Vital Signs BP (!) 151/94 (BP Location: Right Arm)   Pulse 81   Temp 97.9 F (36.6 C) (Temporal)   Resp 17   Ht 5\' 5"  (1.651 m)   Wt (!) 141.5 kg (312 lb)  SpO2 99%   BMI 51.92 kg/m   Physical Exam  Constitutional: She is oriented to person, place, and time. She appears well-developed and well-nourished. No distress.  HENT:  Head: Normocephalic and atraumatic.  Neck: Normal range of motion. Neck supple.  Cardiovascular: Normal rate, regular rhythm and intact distal pulses.  DP pulses are strong and palpable bilaterally  Pulmonary/Chest: Effort normal and breath sounds normal. No respiratory distress.  Abdominal: Soft. She exhibits no distension. There is no tenderness.  Musculoskeletal: She exhibits tenderness. She exhibits no edema.       Lumbar back: She exhibits tenderness and pain.  She exhibits normal range of motion, no swelling, no deformity, no laceration and normal pulse.  ttp of the lower lumbar spine and bilateral paraspinal muscles with left > right.  Pt has 5/5 strength against resistance of bilateral lower extremities.  Hip flexors and extensors are intact   Neurological: She is alert and oriented to person, place, and time. She has normal strength. No sensory deficit. She exhibits normal muscle tone. Coordination and gait normal.  Reflex Scores:      Patellar reflexes are 2+ on the right side and 2+ on the left side.      Achilles reflexes are 2+ on the right side and 2+ on the left side. Skin: Skin is warm and dry. Capillary refill takes less than 2 seconds. No rash noted.  Nursing note and vitals reviewed.    ED Treatments / Results  Labs (all labs ordered are listed, but only abnormal results are displayed) Labs Reviewed - No data to display  EKG None  Radiology No results found.  Procedures Procedures (including critical care time)  Medications Ordered in ED Medications - No data to display   Initial Impression / Assessment and Plan / ED Course  I have reviewed the triage vital signs and the nursing notes.  Pertinent labs & imaging results that were available during my care of the patient were reviewed by me and considered in my medical decision making (see chart for details).     Controlled Substance Prescriptions [employer] Controlled Substance Registry consulted.  Pt received rx for hydrocodone/APAP on 06/08/18 for 13 day supply.    Pt with chronic low back pain with likely pain flare secondary to PT yesterday.  No neuro deficits on exam.  No concerning sx's for emergent neurological process.  Pain currently managed by her orthopedist and she understands that she will need to f/u with them for ongoing management.    Final Clinical Impressions(s) / ED Diagnoses   Final diagnoses:  Chronic bilateral low back pain with left-sided sciatica     ED Discharge Orders    None       Pauline Ausriplett, Krystn Dermody, PA-C 06/11/18 1845    Raeford RazorKohut, Stephen, MD 06/11/18 2053

## 2018-06-11 NOTE — Discharge Instructions (Addendum)
Call your orthopedic provider to arrange a follow-up appt.

## 2018-06-21 ENCOUNTER — Other Ambulatory Visit: Payer: Self-pay

## 2018-06-21 ENCOUNTER — Encounter (HOSPITAL_COMMUNITY): Payer: Self-pay | Admitting: Emergency Medicine

## 2018-06-21 ENCOUNTER — Emergency Department (HOSPITAL_COMMUNITY)
Admission: EM | Admit: 2018-06-21 | Discharge: 2018-06-21 | Disposition: A | Discharge status: Home / Self Care | Payer: No Typology Code available for payment source | Attending: Emergency Medicine | Admitting: Emergency Medicine

## 2018-06-21 DIAGNOSIS — M5440 Lumbago with sciatica, unspecified side: Secondary | ICD-10-CM | POA: Insufficient documentation

## 2018-06-21 DIAGNOSIS — M62830 Muscle spasm of back: Secondary | ICD-10-CM

## 2018-06-21 DIAGNOSIS — M545 Low back pain: Secondary | ICD-10-CM | POA: Diagnosis present

## 2018-06-21 DIAGNOSIS — M5430 Sciatica, unspecified side: Secondary | ICD-10-CM

## 2018-06-21 MED ORDER — HYDROCODONE-ACETAMINOPHEN 7.5-325 MG PO TABS: 1.0000 | ORAL_TABLET | ORAL | 0 refills | Status: DC | PRN

## 2018-06-21 MED ORDER — ONDANSETRON HCL 4 MG PO TABS
4.0000 mg | ORAL_TABLET | Freq: Once | ORAL | Status: AC
  Administered 2018-06-21: 4 mg via ORAL
  Filled 2018-06-21: qty 1

## 2018-06-21 MED ORDER — MORPHINE SULFATE (PF) 10 MG/ML IV SOLN
10.0000 mg | Freq: Once | INTRAVENOUS | Status: AC
  Administered 2018-06-21: 10 mg via INTRAMUSCULAR
  Filled 2018-06-21: qty 1

## 2018-06-21 MED ORDER — DEXAMETHASONE 4 MG PO TABS: 4.0000 mg | ORAL_TABLET | Freq: Two times a day (BID) | ORAL | 0 refills | Status: DC

## 2018-06-21 MED ORDER — CYCLOBENZAPRINE HCL 10 MG PO TABS: 10.0000 mg | ORAL_TABLET | Freq: Three times a day (TID) | ORAL | 0 refills | Status: DC

## 2018-06-21 MED ORDER — DIAZEPAM 5 MG PO TABS
10.0000 mg | ORAL_TABLET | Freq: Once | ORAL | Status: AC
  Administered 2018-06-21: 10 mg via ORAL
  Filled 2018-06-21: qty 2

## 2018-06-21 NOTE — ED Triage Notes (Signed)
On set in May, back injury affecting L3,4,5 due on surgery on July 18th, after bending over today, pain in worse.

## 2018-06-21 NOTE — ED Triage Notes (Signed)
Pt states she was told to come here when pain in unbearable and she has been seen in fast track for injections and pain medication.

## 2018-06-21 NOTE — ED Provider Notes (Signed)
[facility] EMERGENCY DEPARTMENT Provider Note   CSN: [id number] Arrival date & time: 06/21/18  1801     History   Chief Complaint Chief Complaint  Patient presents with  . Back Pain    HPI Angelica Crawford is a 32 y.o. female.  Patient is a 32 year old female who presents to the emergency department with a complaint of lower back pain.  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  She states she has been having increasing pain since that time.  She is been [facility].  She is to be evaluated for possible procedure in about 10 or 12 days.  Her specialist is out of the country at this time, and the patient states that her current medication is not helping.  She denies any loss of bowel or bladder function.  She is been not having any new falls.  She has pain that goes down her leg.  The patient has been diagnosed with bulging disc at the L3-L4 and L5 areas.  The patient has also been diagnosed with sacroiliitis.  She presents now because she says she cannot take the pain any longer.     Past Medical History:  Diagnosis Date  . Back pain     There are no active problems to display for this patient.   History reviewed. No pertinent surgical history.   OB History   None      Home Medications    Prior to Admission medications   Medication Sig Start Date End Date Taking? Authorizing Provider  cyclobenzaprine (FLEXERIL) 10 MG tablet Take 1 tablet (10 mg total) by mouth 3 (three) times daily. 06/21/18   Ivery QualeBryant, Kenai Fluegel, PA-C  dexamethasone (DECADRON) 4 MG tablet Take 1 tablet (4 mg total) by mouth 2 (two) times daily with a meal. 06/21/18   Ivery QualeBryant, Mitul Hallowell, PA-C  diazepam (VALIUM) 5 MG tablet Take 1 tablet (5 mg total) by mouth every 8 (eight) hours as needed for muscle spasms. 05/09/18   Burgess AmorIdol, Julie, PA-C  diclofenac (VOLTAREN) 75 MG EC tablet Take 1 tablet (75 mg total) by mouth 2  (two) times daily. Patient not taking: Reported on 05/16/2018 05/05/18   Ivery QualeBryant, Debbie Bellucci, PA-C  HYDROcodone-acetaminophen [facility](NORCO) 7.5-325 MG tablet Take 1 tablet by mouth every 4 (four) hours as needed. 06/21/18   Ivery QualeBryant, Aariya Ferrick, PA-C  LORazepam (ATIVAN) 0.5 MG tablet Take 0.5 mg by mouth daily as needed for anxiety.  09/12/16   [provider]  predniSONE (DELTASONE) 10 MG tablet Take 6 tablets day one, 5 tablets day two, 4 tablets day three, 3 tablets day four, 2 tablets day five, then 1 tablet day six 06/11/18   Pauline Ausriplett, Tammy, PA-C    Family History Family History  Problem Relation Age of Onset  . Multiple sclerosis Mother   . Cancer Mother   . Breast cancer Mother     Social History Social History   Tobacco Use  . Smoking status: Never Smoker  . Smokeless tobacco: Never Used  Substance Use Topics  . Alcohol use: No  . Drug use: No     Allergies   Penicillins and Sulfa antibiotics   Review of Systems Review of Systems  Constitutional: Negative for activity change.       All ROS Neg except as noted in HPI  HENT: Negative for nosebleeds.   Eyes: Negative for photophobia and discharge.  Respiratory: Negative for cough, shortness of breath and wheezing.   Cardiovascular: Negative for chest pain and palpitations.  Gastrointestinal: Negative for abdominal pain and blood in stool.  Genitourinary: Negative for dysuria, frequency and hematuria.  Musculoskeletal: Positive for back pain. Negative for arthralgias and neck pain.  Skin: Negative.   Neurological: Negative for dizziness, seizures and speech difficulty.  Psychiatric/Behavioral: Negative for confusion and hallucinations.     Physical Exam Updated Vital Signs BP 126/64 (BP Location: Right Arm)   Pulse (!) 105   Temp 98.5 F (36.9 C) (Oral)   Resp 16   Ht 5\' 5"  (1.651 m)   Wt (!) 141.5 kg (312 lb)   LMP 06/01/2018   SpO2 99%   BMI 51.92 kg/m   Physical Exam  Constitutional: She is oriented to person,  place, and time. She appears well-developed and well-nourished.  Non-toxic appearance.  HENT:  Head: Normocephalic.  Right Ear: Tympanic membrane and external ear normal.  Left Ear: Tympanic membrane and external ear normal.  Eyes: Pupils are equal, round, and reactive to light. EOM and lids are normal.  Neck: Normal range of motion. Neck supple. Carotid bruit is not present.  Cardiovascular: Normal rate, regular rhythm, normal heart sounds, intact distal pulses and normal pulses.  Pulmonary/Chest: Breath sounds normal. No respiratory distress.  Abdominal: Soft. Bowel sounds are normal. There is no tenderness. There is no guarding.  Musculoskeletal: Normal range of motion.       Lumbar back: She exhibits pain and spasm.  Patient has pain with movement or attempted range of motion of the lower back.  She has pain with ambulation.  There is left greater than right paraspinal area tenderness.  Lymphadenopathy:       Head (right side): No submandibular adenopathy present.       Head (left side): No submandibular adenopathy present.    She has no cervical adenopathy.  Neurological: She is alert and oriented to person, place, and time. She has normal strength. No cranial nerve deficit or sensory deficit.  Gait is intact.  There is no foot drop appreciated.  There are no sensory deficits of the right or left lower extremity.  Skin: Skin is warm and dry.  Psychiatric: She has a normal mood and affect. Her speech is normal.  Nursing note and vitals reviewed.    ED Treatments / Results  Labs (all labs ordered are listed, but only abnormal results are displayed) Labs Reviewed - No data to display  EKG None  Radiology No results found.  Procedures Procedures (including critical care time)  Medications Ordered in ED Medications  Morphine Sulfate (PF) SOLN 10 mg (10 mg Intramuscular Given 06/21/18 1938)  diazepam (VALIUM) tablet 10 mg (10 mg Oral Given 06/21/18 1937)  ondansetron (ZOFRAN)  tablet 4 mg (4 mg Oral Given 06/21/18 1937)     Initial Impression / Assessment and Plan / ED Course  I have reviewed the triage vital signs and the nursing notes.  Pertinent labs & imaging results that were available during my care of the patient were reviewed by me and considered in my medical decision making (see chart for details).       Final Clinical Impressions(s) / ED Diagnoses  MDM  Vital signs reviewed.  Past medical records reviewed.  Previous CT scan of the lumbar spine reviewed.  Patient has been diagnosed with degenerative disc disease and sacroiliitis.  She is scheduled to see the orthopedic specialist again in about 10 to 12 days.  Patient is having increase in severe pain.  There is been no loss of bowel or bladder function.  There is no evidence on the examination for any cauda equina or other emergent changes.  I suspect that this is an extension of the already diagnosed degenerative disc disease and sacroiliitis.  Patient was treated here in the emergency department with intramuscular morphine and oral Valium.  Patient was given 3 days of Flexeril, Decadron, and Norco.  I have given the patient instructions that she should discuss her breakthrough pains with her physicians at [facility] physician until she can be seen by her orthopedic specialist.  Patient is in agreement with this plan.   Final diagnoses:  Sciatica, unspecified laterality  Muscle spasm of back    ED Discharge Orders        Ordered    cyclobenzaprine (FLEXERIL) 10 MG tablet  3 times daily     06/21/18 1937    dexamethasone (DECADRON) 4 MG tablet  2 times daily with meals     06/21/18 1937    HYDROcodone-acetaminophen (NORCO) 7.5-325 MG tablet  Every 4 hours PRN     06/21/18 1937       Ivery Quale, PA-C 06/21/18 1948    Donnetta Hutching, MD 06/22/18 1121

## 2018-06-21 NOTE — Discharge Instructions (Signed)
Please use a heating pad to your back.  Please rest her back is much as possible.  Please notify your primary physician at [facility] that you are having breakthrough pain in spite of the medication suggested by your specialist, and allow them to assist you with your pain management.  Please use Norco 7.5 mg every 4 hours for severe pain.  Please use Flexeril 3 times daily, and Decadron 2 times daily with food.  Norco and Flexeril may cause drowsiness.  Please use these medications with caution.  Do not drive a vehicle, drink alcohol, operate machinery, or participate in activities requiring concentration when taking these medications.

## 2018-06-26 ENCOUNTER — Encounter (HOSPITAL_COMMUNITY): Payer: Self-pay | Admitting: Emergency Medicine

## 2018-06-26 ENCOUNTER — Other Ambulatory Visit: Payer: Self-pay

## 2018-06-26 ENCOUNTER — Emergency Department (HOSPITAL_COMMUNITY)
Admission: EM | Admit: 2018-06-26 | Discharge: 2018-06-26 | Disposition: A | Discharge status: Home / Self Care | Payer: No Typology Code available for payment source | Attending: Emergency Medicine | Admitting: Emergency Medicine

## 2018-06-26 DIAGNOSIS — M5442 Lumbago with sciatica, left side: Secondary | ICD-10-CM | POA: Insufficient documentation

## 2018-06-26 DIAGNOSIS — M545 Low back pain: Secondary | ICD-10-CM | POA: Diagnosis present

## 2018-06-26 DIAGNOSIS — G8929 Other chronic pain: Secondary | ICD-10-CM

## 2018-06-26 MED ORDER — KETOROLAC TROMETHAMINE 60 MG/2ML IM SOLN
60.0000 mg | Freq: Once | INTRAMUSCULAR | Status: AC
  Administered 2018-06-26: 60 mg via INTRAMUSCULAR
  Filled 2018-06-26: qty 2

## 2018-06-26 MED ORDER — NAPROXEN 500 MG PO TABS: 500.0000 mg | ORAL_TABLET | Freq: Two times a day (BID) | ORAL | 0 refills | Status: DC

## 2018-06-26 MED ORDER — DIAZEPAM 5 MG PO TABS
5.0000 mg | ORAL_TABLET | Freq: Once | ORAL | Status: AC
  Administered 2018-06-26: 5 mg via ORAL
  Filled 2018-06-26: qty 1

## 2018-06-26 NOTE — Discharge Instructions (Addendum)
Apply ice packs on and off to your back.  You may follow-up with your primary care provider or your orthopedic provider on Monday

## 2018-06-26 NOTE — ED Provider Notes (Signed)
[facility] EMERGENCY DEPARTMENT Provider Note   CSN: [id number] Arrival date & time: 06/26/18  2106     History   Chief Complaint Chief Complaint  Patient presents with  . Back Pain    HPI Angelica Crawford is a 32 y.o. female.  HPI   Angelica Crawford is a 32 y.o. female who presents to the Emergency Department complaining of persistent low back pain.  Seen here multiple times for same.  She describes an aching pain to her midline low back that radiates into her left buttock and down her lateral left leg.  Pain worse with weight bearing and excessive sitting.  Pain increased today after physical therapy.  States pain is similar to previous.  She is currently managed by [facility] orthopedics and she is scheduled to have an "steriod injection" to her lower back on July 18.  She denies recent trauma, abd pain, numbness or weakness of the lower extremities, urine or bowel changes, fever or chills.     Past Medical History:  Diagnosis Date  . Back pain     There are no active problems to display for this patient.   History reviewed. No pertinent surgical history.   OB History   None      Home Medications    Prior to Admission medications   Medication Sig Start Date End Date Taking? Authorizing Provider  cyclobenzaprine (FLEXERIL) 10 MG tablet Take 1 tablet (10 mg total) by mouth 3 (three) times daily. 06/21/18   Ivery Quale, PA-C  dexamethasone (DECADRON) 4 MG tablet Take 1 tablet (4 mg total) by mouth 2 (two) times daily with a meal. 06/21/18   Ivery Quale, PA-C  diazepam (VALIUM) 5 MG tablet Take 1 tablet (5 mg total) by mouth every 8 (eight) hours as needed for muscle spasms. 05/09/18   Burgess Amor, PA-C  HYDROcodone-acetaminophen (NORCO) 7.5-325 MG tablet Take 1 tablet by mouth every 4 (four) hours as needed. 06/21/18   Ivery Quale, PA-C  LORazepam (ATIVAN) 0.5 MG tablet Take 0.5 mg by mouth daily as needed for anxiety.  09/12/16   [provider]  naproxen  (NAPROSYN) 500 MG tablet Take 1 tablet (500 mg total) by mouth 2 (two) times daily with a meal. 06/26/18   Raymie Trani, PA-C  predniSONE (DELTASONE) 10 MG tablet Take 6 tablets day one, 5 tablets day two, 4 tablets day three, 3 tablets day four, 2 tablets day five, then 1 tablet day six 06/11/18   Pauline Aus, PA-C    Family History Family History  Problem Relation Age of Onset  . Multiple sclerosis Mother   . Cancer Mother   . Breast cancer Mother     Social History Social History   Tobacco Use  . Smoking status: Never Smoker  . Smokeless tobacco: Never Used  Substance Use Topics  . Alcohol use: No  . Drug use: No     Allergies   Penicillins and Sulfa antibiotics   Review of Systems Review of Systems  Constitutional: Negative for fever.  Respiratory: Negative for shortness of breath.   Gastrointestinal: Negative for abdominal pain, constipation and vomiting.  Genitourinary: Negative for decreased urine volume, difficulty urinating, dysuria, flank pain and hematuria.  Musculoskeletal: Positive for back pain. Negative for joint swelling.  Skin: Negative for rash.  Neurological: Negative for weakness and numbness.  All other systems reviewed and are negative.    Physical Exam Updated Vital Signs BP 140/87 (BP Location: Right Arm)   Pulse 89  Temp (!) 97 F (36.1 C) (Temporal)   Resp 18   LMP 06/01/2018   SpO2 100%   Physical Exam  Constitutional: She is oriented to person, place, and time. She appears well-developed and well-nourished. No distress.  HENT:  Head: Normocephalic and atraumatic.  Neck: Normal range of motion. Neck supple.  Cardiovascular: Normal rate, regular rhythm and intact distal pulses.  DP pulses are strong and palpable bilaterally  Pulmonary/Chest: Effort normal and breath sounds normal. No respiratory distress.  Abdominal: Soft. She exhibits no distension. There is no tenderness.  Musculoskeletal: She exhibits tenderness. She  exhibits no edema.       Lumbar back: She exhibits tenderness and pain. She exhibits normal range of motion, no swelling, no deformity, no laceration and normal pulse.  ttp of the lower lumbar spine and left paraspinal muscles and SI joint space.  Pt has 5/5 strength of bilateral lower extremities.  Neg SLR bilaterally.  Hip flexors and extensors intact   Neurological: She is alert and oriented to person, place, and time. She has normal strength. No sensory deficit. She exhibits normal muscle tone. Coordination and gait normal.  Reflex Scores:      Patellar reflexes are 2+ on the right side and 2+ on the left side.      Achilles reflexes are 2+ on the right side and 2+ on the left side. Skin: Skin is warm and dry. Capillary refill takes less than 2 seconds. No rash noted.  Nursing note and vitals reviewed.    ED Treatments / Results  Labs (all labs ordered are listed, but only abnormal results are displayed) Labs Reviewed - No data to display  EKG None  Radiology No results found.  Procedures Procedures (including critical care time)  Medications Ordered in ED Medications  ketorolac (TORADOL) injection 60 mg (60 mg Intramuscular Given 06/26/18 2211)  diazepam (VALIUM) tablet 5 mg (5 mg Oral Given 06/26/18 2211)     Initial Impression / Assessment and Plan / ED Course  I have reviewed the triage vital signs and the nursing notes.  Pertinent labs & imaging results that were available during my care of the patient were reviewed by me and considered in my medical decision making (see chart for details).     Patient with acute on chronic low back pain and left-sided sciatica.  Multiple ER visits for the same.  Patient reviewed on the narcotic database, multiple narcotic prescriptions since the latter part of May.  On exam, no focal neuro deficits, patient is ambulatory, gait steady.  No concerning symptoms for cauda equina or spinal abscess, symptoms are similar to previous visits.   Patient has upcoming appointment on July 18 with her orthopedic specialist for steroid injections.  She received Rx for hydrocodone 5 days ago.   I have discussed that further narcotic medications are not warranted.  She verbalized understanding of this and she agrees to PCP follow-up or to follow-up with her orthopedic as previously scheduled.  Final Clinical Impressions(s) / ED Diagnoses   Final diagnoses:  Chronic midline low back pain with left-sided sciatica    ED Discharge Orders        Ordered    naproxen (NAPROSYN) 500 MG tablet  2 times daily with meals     06/26/18 2207       Pauline Ausriplett, Jadarian Mckay, PA-C 06/28/18 0054    Samuel JesterMcManus, Kathleen, DO 06/28/18 1804

## 2018-06-26 NOTE — ED Triage Notes (Signed)
Pt c/o back pain since 05/04/18, has  Had CT, MRI, and is been seeing by PT, pt scheduled for back injections beginning 07/02/18, pt c/o increased pain today

## 2018-11-04 ENCOUNTER — Emergency Department (HOSPITAL_COMMUNITY)
Admission: EM | Admit: 2018-11-04 | Discharge: 2018-11-04 | Disposition: A | Discharge status: Home / Self Care | Payer: No Typology Code available for payment source | Attending: Emergency Medicine | Admitting: Emergency Medicine

## 2018-11-04 ENCOUNTER — Encounter (HOSPITAL_COMMUNITY): Payer: Self-pay

## 2018-11-04 DIAGNOSIS — M5432 Sciatica, left side: Secondary | ICD-10-CM | POA: Insufficient documentation

## 2018-11-04 DIAGNOSIS — Y999 Unspecified external cause status: Secondary | ICD-10-CM | POA: Insufficient documentation

## 2018-11-04 DIAGNOSIS — S3992XA Unspecified injury of lower back, initial encounter: Secondary | ICD-10-CM | POA: Diagnosis present

## 2018-11-04 DIAGNOSIS — S39012A Strain of muscle, fascia and tendon of lower back, initial encounter: Secondary | ICD-10-CM | POA: Diagnosis not present

## 2018-11-04 DIAGNOSIS — Y939 Activity, unspecified: Secondary | ICD-10-CM | POA: Diagnosis not present

## 2018-11-04 DIAGNOSIS — Z79899 Other long term (current) drug therapy: Secondary | ICD-10-CM | POA: Diagnosis not present

## 2018-11-04 DIAGNOSIS — X500XXA Overexertion from strenuous movement or load, initial encounter: Secondary | ICD-10-CM | POA: Diagnosis not present

## 2018-11-04 DIAGNOSIS — Y929 Unspecified place or not applicable: Secondary | ICD-10-CM | POA: Diagnosis not present

## 2018-11-04 MED ORDER — CYCLOBENZAPRINE HCL 10 MG PO TABS
10.0000 mg | ORAL_TABLET | Freq: Once | ORAL | Status: AC
  Administered 2018-11-04: 10 mg via ORAL
  Filled 2018-11-04: qty 1

## 2018-11-04 MED ORDER — DEXAMETHASONE 4 MG PO TABS: 4.0000 mg | ORAL_TABLET | Freq: Two times a day (BID) | ORAL | 0 refills | Status: DC

## 2018-11-04 MED ORDER — PREDNISONE 50 MG PO TABS
60.0000 mg | ORAL_TABLET | Freq: Once | ORAL | Status: AC
  Administered 2018-11-04: 60 mg via ORAL
  Filled 2018-11-04: qty 1

## 2018-11-04 MED ORDER — PROMETHAZINE HCL 12.5 MG PO TABS
12.5000 mg | ORAL_TABLET | Freq: Once | ORAL | Status: AC
  Administered 2018-11-04: 12.5 mg via ORAL
  Filled 2018-11-04: qty 1

## 2018-11-04 MED ORDER — MORPHINE SULFATE (PF) 10 MG/ML IV SOLN
10.0000 mg | Freq: Once | INTRAVENOUS | Status: AC
  Administered 2018-11-04: 10 mg via INTRAMUSCULAR
  Filled 2018-11-04: qty 1

## 2018-11-04 MED ORDER — CYCLOBENZAPRINE HCL 10 MG PO TABS: 10.0000 mg | ORAL_TABLET | Freq: Three times a day (TID) | ORAL | 0 refills | Status: DC

## 2018-11-04 NOTE — ED Triage Notes (Signed)
Pt reports that she injured her back in may . Pt states lower back flared up yesterday. Feels better with standing

## 2018-11-04 NOTE — Discharge Instructions (Addendum)
Please rest her back as much as possible.  Use a heating pad to the area when you are at rest.  Please add Flexeril 3 times daily and Decadron 2 times daily to your current meloxicam and Ultram.  Please see your primary physician for additional evaluation and management.

## 2018-11-04 NOTE — ED Provider Notes (Signed)
[facility] EMERGENCY DEPARTMENT Provider Note   CSN: [id number] Arrival date & time: 11/04/18  1434     History   Chief Complaint Chief Complaint  Patient presents with  . Back Pain    HPI Angelica Crawford is a 32 y.o. female.  Patient is a 32 year old female who presents to the emergency department with a complaint of lower back pain.  The patient states that on yesterday November 19, she bent over and was crawling on the floor to plug something in when her back began to give her severe pain.  Mostly on the left side, but from the midline to the left side.  She has a history of bulging disc present.  She has been placed on meloxicam.  She used the meloxicam but did not get any relief.  She also tried to rest her back on last night and this morning but this was not effective either.  The patient states that on last night nor today she has had not had any loss of bowel or bladder function, no numbness in the saddle or private area, no loss of coordination of the lower extremities.  She has not had any recent falls or injury to the back.  It is of note that she has been working out trying to strengthen her back since her diagnosis in May of bulging disc.  She presents now because her medications at home were not working and she is having increasing pain.  The history is provided by the patient.    Past Medical History:  Diagnosis Date  . Back pain     There are no active problems to display for this patient.   History reviewed. No pertinent surgical history.   OB History   None      Home Medications    Prior to Admission medications   Medication Sig Start Date End Date Taking? Authorizing Provider  cyclobenzaprine (FLEXERIL) 10 MG tablet Take 1 tablet (10 mg total) by mouth 3 (three) times daily. 06/21/18   Ivery Quale, PA-C  dexamethasone (DECADRON) 4 MG tablet Take 1 tablet (4 mg total) by mouth 2 (two) times daily with a meal. 06/21/18   Ivery Quale, PA-C    diazepam (VALIUM) 5 MG tablet Take 1 tablet (5 mg total) by mouth every 8 (eight) hours as needed for muscle spasms. 05/09/18   Burgess Amor, PA-C  HYDROcodone-acetaminophen (NORCO) 7.5-325 MG tablet Take 1 tablet by mouth every 4 (four) hours as needed. 06/21/18   Ivery Quale, PA-C  LORazepam (ATIVAN) 0.5 MG tablet Take 0.5 mg by mouth daily as needed for anxiety.  09/12/16   [provider]  naproxen (NAPROSYN) 500 MG tablet Take 1 tablet (500 mg total) by mouth 2 (two) times daily with a meal. 06/26/18   Triplett, Tammy, PA-C  predniSONE (DELTASONE) 10 MG tablet Take 6 tablets day one, 5 tablets day two, 4 tablets day three, 3 tablets day four, 2 tablets day five, then 1 tablet day six 06/11/18   Pauline Aus, PA-C    Family History Family History  Problem Relation Age of Onset  . Multiple sclerosis Mother   . Cancer Mother   . Breast cancer Mother     Social History Social History   Tobacco Use  . Smoking status: Never Smoker  . Smokeless tobacco: Never Used  Substance Use Topics  . Alcohol use: No  . Drug use: No     Allergies   Penicillins; Sulfa antibiotics; and Gabapentin  Review of Systems Review of Systems  Constitutional: Negative for activity change.       All ROS Neg except as noted in HPI  HENT: Negative for nosebleeds.   Eyes: Negative for photophobia and discharge.  Respiratory: Negative for cough, shortness of breath and wheezing.   Cardiovascular: Negative for chest pain and palpitations.  Gastrointestinal: Negative for abdominal pain and blood in stool.  Genitourinary: Negative for dysuria, frequency and hematuria.  Musculoskeletal: Positive for back pain. Negative for arthralgias and neck pain.  Skin: Negative.   Neurological: Negative for dizziness, seizures and speech difficulty.  Psychiatric/Behavioral: Negative for confusion and hallucinations.     Physical Exam Updated Vital Signs BP (!) 149/93 (BP Location: Left Arm)   Pulse (!)  114   Temp 97.9 F (36.6 C) (Oral)   Resp 20   Wt 135.5 kg   LMP 10/28/2018   SpO2 99%   BMI 49.72 kg/m   Physical Exam  Constitutional: She is oriented to person, place, and time. She appears well-developed and well-nourished.  Non-toxic appearance.  HENT:  Head: Normocephalic.  Right Ear: Tympanic membrane and external ear normal.  Left Ear: Tympanic membrane and external ear normal.  Eyes: Pupils are equal, round, and reactive to light. EOM and lids are normal.  Neck: Normal range of motion. Neck supple. Carotid bruit is not present.  Cardiovascular: Normal rate, regular rhythm, normal heart sounds, intact distal pulses and normal pulses.  Pulmonary/Chest: Breath sounds normal. No respiratory distress.  Abdominal: Soft. Bowel sounds are normal. There is no tenderness. There is no guarding.  Musculoskeletal: Normal range of motion.       Lumbar back: She exhibits pain and spasm.  Left straight leg raise is positive.  No palpable step-off of the lumbar spine appreciated.  No hot areas appreciated.  Lymphadenopathy:       Head (right side): No submandibular adenopathy present.       Head (left side): No submandibular adenopathy present.    She has no cervical adenopathy.  Neurological: She is alert and oriented to person, place, and time. She has normal strength. No cranial nerve deficit or sensory deficit.  Skin: Skin is warm and dry.  Psychiatric: She has a normal mood and affect. Her speech is normal.  Nursing note and vitals reviewed.    ED Treatments / Results  Labs (all labs ordered are listed, but only abnormal results are displayed) Labs Reviewed - No data to display  EKG None  Radiology No results found.  Procedures Procedures (including critical care time)  Medications Ordered in ED Medications - No data to display   Initial Impression / Assessment and Plan / ED Course  I have reviewed the triage vital signs and the nursing notes.  Pertinent labs &  imaging results that were available during my care of the patient were reviewed by me and considered in my medical decision making (see chart for details).      Final Clinical Impressions(s) / ED Diagnoses MDM  Vital signs reviewed.  Pulse oximetry is 99% on room air.  Within normal limits by my interpretation.  The patient has increased lower back pain after bending and crawling on yesterday November 19.  There was no loss of bowel or bladder function, no evidence on examination at this time of cauda equina or other emergent changes.  Patient has a history of bulging disc, and has received injections in her back, as well as been on daily meloxicam.  The examination favors  muscle strain aggravating bulging disc involving the lumbar spine.  Patient will be treated with a short course of steroid and muscle relaxer added to her current meloxicam regiment.  I have asked the patient to use a heating pad to the area and to rest her back over the next few days.  She has been excused from work duty over the next few days to accomplish these goals.   Final diagnoses:  Strain of lumbar region, initial encounter  Left sided sciatica    ED Discharge Orders         Ordered    cyclobenzaprine (FLEXERIL) 10 MG tablet  3 times daily     11/04/18 1625    dexamethasone (DECADRON) 4 MG tablet  2 times daily with meals     11/04/18 1625           Ivery QualeBryant, Laconya Clere, PA-C 11/05/18 2126    Bethann BerkshireZammit, Joseph, MD 11/07/18 [phone]

## 2018-11-11 ENCOUNTER — Emergency Department (HOSPITAL_COMMUNITY)
Admission: EM | Admit: 2018-11-11 | Discharge: 2018-11-11 | Disposition: A | Discharge status: Home / Self Care | Payer: No Typology Code available for payment source | Attending: Emergency Medicine | Admitting: Emergency Medicine

## 2018-11-11 ENCOUNTER — Other Ambulatory Visit: Payer: Self-pay

## 2018-11-11 ENCOUNTER — Encounter (HOSPITAL_COMMUNITY): Payer: Self-pay

## 2018-11-11 DIAGNOSIS — M5442 Lumbago with sciatica, left side: Secondary | ICD-10-CM

## 2018-11-11 DIAGNOSIS — M545 Low back pain: Secondary | ICD-10-CM | POA: Diagnosis present

## 2018-11-11 DIAGNOSIS — Z79899 Other long term (current) drug therapy: Secondary | ICD-10-CM | POA: Diagnosis not present

## 2018-11-11 MED ORDER — CYCLOBENZAPRINE HCL 10 MG PO TABS: 10.0000 mg | ORAL_TABLET | Freq: Three times a day (TID) | ORAL | 0 refills | Status: DC | PRN

## 2018-11-11 MED ORDER — HYDROMORPHONE HCL 1 MG/ML IJ SOLN
1.0000 mg | Freq: Once | INTRAMUSCULAR | Status: AC
  Administered 2018-11-11: 1 mg via INTRAMUSCULAR
  Filled 2018-11-11: qty 1

## 2018-11-11 MED ORDER — PREDNISONE 10 MG PO TABS: 20.0000 mg | ORAL_TABLET | Freq: Two times a day (BID) | ORAL | 0 refills | Status: DC

## 2018-11-11 MED ORDER — OXYCODONE-ACETAMINOPHEN 5-325 MG PO TABS
2.0000 | ORAL_TABLET | Freq: Once | ORAL | Status: AC
  Administered 2018-11-11: 2 via ORAL
  Filled 2018-11-11: qty 2

## 2018-11-11 MED ORDER — KETOROLAC TROMETHAMINE 60 MG/2ML IM SOLN
60.0000 mg | Freq: Once | INTRAMUSCULAR | Status: AC
  Administered 2018-11-11: 60 mg via INTRAMUSCULAR
  Filled 2018-11-11: qty 2

## 2018-11-11 NOTE — ED Provider Notes (Signed)
[facility] EMERGENCY DEPARTMENT Provider Note   CSN: [id number] Arrival date & time: 11/11/18  1530     History   Chief Complaint Chief Complaint  Patient presents with  . Back Pain    HPI Angelica Crawford is a 32 y.o. female.  Patient is a 32 year old female with history of lumbar disc disease.  She presents with complaints of low back pain.  She has been having issues since last May and had steroid injections in her back.  She was doing well until her pain recurred several days ago.  This began in the absence of any specific injury or trauma.  She reports pain in her left lumbar region that radiates into her leg.  She denies any numbness or tingling.  She denies weakness.  She denies bowel or bladder complaints.  The history is provided by the patient.  Back Pain   This is a recurrent problem. The current episode started 2 days ago. The problem occurs constantly. The problem has been rapidly worsening. The pain is associated with no known injury. The pain is present in the lumbar spine. The quality of the pain is described as stabbing. The pain radiates to the left thigh. The pain is severe. The symptoms are aggravated by bending, twisting and certain positions. The pain is the same all the time.    Past Medical History:  Diagnosis Date  . Back pain     There are no active problems to display for this patient.   History reviewed. No pertinent surgical history.   OB History   None      Home Medications    Prior to Admission medications   Medication Sig Start Date End Date Taking? Authorizing Provider  BIOTIN PO Take 2 each by mouth daily. *Gummies   Yes [provider]  cyclobenzaprine (FLEXERIL) 10 MG tablet Take 1 tablet (10 mg total) by mouth 3 (three) times daily. 11/04/18  Yes Ivery Quale, PA-C  diazepam (VALIUM) 5 MG tablet Take 1 tablet (5 mg total) by mouth every 8 (eight) hours as needed for muscle spasms. 05/09/18  Yes Idol, Raynelle Fanning, PA-C    FLUoxetine (PROZAC) 40 MG capsule Take 40 mg by mouth daily. 08/15/18  Yes [provider]  ibuprofen (ADVIL,MOTRIN) 200 MG tablet Take 400 mg by mouth every 6 (six) hours as needed for mild pain or moderate pain.   Yes [provider]  Lactobacillus (PROBIOTIC ACIDOPHILUS PO) Take 2 each by mouth daily.   Yes [provider]  Multiple Vitamin (MULTIVITAMIN WITH MINERALS) TABS tablet Take 1 tablet by mouth daily.   Yes [provider]  dexamethasone (DECADRON) 4 MG tablet Take 1 tablet (4 mg total) by mouth 2 (two) times daily with a meal. Patient not taking: Reported on 11/11/2018 11/04/18   Ivery Quale, PA-C    Family History Family History  Problem Relation Age of Onset  . Multiple sclerosis Mother   . Cancer Mother   . Breast cancer Mother     Social History Social History   Tobacco Use  . Smoking status: Never Smoker  . Smokeless tobacco: Never Used  Substance Use Topics  . Alcohol use: No  . Drug use: No     Allergies   Penicillins; Sulfa antibiotics; and Gabapentin   Review of Systems Review of Systems  Musculoskeletal: Positive for back pain.  All other systems reviewed and are negative.    Physical Exam Updated Vital Signs BP (!) 150/98   Pulse Marland Kitchen)  115   Temp 98.1 F (36.7 C) (Oral)   Resp 18   Ht 5\' 5"  (1.651 m)   Wt 135.2 kg   LMP 10/28/2018   SpO2 95%   BMI 49.59 kg/m   Physical Exam  Constitutional: She is oriented to person, place, and time. She appears well-developed and well-nourished. No distress.  HENT:  Head: Normocephalic and atraumatic.  Neck: Normal range of motion. Neck supple.  Pulmonary/Chest: Effort normal.  Musculoskeletal:  There is tenderness to palpation in the soft tissues of the lower lumbar region.  Neurological: She is alert and oriented to person, place, and time.  DTRs are trace and symmetrical in both lower extremities.  Strength is 5 out of 5 in both lower extremities.  She is  ambulatory without difficulty, however with an antalgic gait.  Skin: She is not diaphoretic.  Nursing note and vitals reviewed.    ED Treatments / Results  Labs (all labs ordered are listed, but only abnormal results are displayed) Labs Reviewed - No data to display  EKG None  Radiology No results found.  Procedures Procedures (including critical care time)  Medications Ordered in ED Medications  ketorolac (TORADOL) injection 60 mg (60 mg Intramuscular Given 11/11/18 1620)  oxyCODONE-acetaminophen (PERCOCET/ROXICET) 5-325 MG per tablet 2 tablet (2 tablets Oral Given 11/11/18 1620)     Initial Impression / Assessment and Plan / ED Course  I have reviewed the triage vital signs and the nursing notes.  Pertinent labs & imaging results that were available during my care of the patient were reviewed by me and considered in my medical decision making (see chart for details).  Patient is a 32 year old female presenting with complaints of low back pain.  There is nothing on her physical examination that indicates any emergently surgical condition.  Her reflexes and strength are symmetrical and there are no bowel or bladder issues.  She was given Toradol and Percocet, however states this did not help at all.  She will be given an additional injection of Dilaudid, then discharged home.  She has referenced and spoken negatively about other providers who have declined to give her narcotics in the past and there are certain aspects of her history that concern me for drug-seeking behavior.  Reviewing her prescription filling habits on the narcotic database reveals that she feels many prescriptions for opioids, benzodiazepines, and stimulant medications.  I am uncomfortable prescribing additional opioids given the extensive list of her prior filled prescriptions.    Final Clinical Impressions(s) / ED Diagnoses   Final diagnoses:  None    ED Discharge Orders    None       Geoffery Lyonselo,  Reagen Haberman, MD 11/11/18 1713

## 2018-11-11 NOTE — Discharge Instructions (Addendum)
Prednisone as prescribed.  Flexeril as prescribed as needed for pain.  Follow-up with your primary doctor or orthopedist in the next few days if symptoms are not improving.

## 2018-11-11 NOTE — ED Triage Notes (Signed)
Pt reports injuring back in May. Back conts to hurt since seen 11/04/18.  Pt cant see ortho until Monday

## 2018-11-15 ENCOUNTER — Encounter (HOSPITAL_COMMUNITY): Payer: Self-pay | Admitting: *Deleted

## 2018-11-15 ENCOUNTER — Emergency Department (HOSPITAL_COMMUNITY): Payer: BC Managed Care – PPO

## 2018-11-15 ENCOUNTER — Emergency Department (HOSPITAL_COMMUNITY)
Admission: EM | Admit: 2018-11-15 | Discharge: 2018-11-15 | Disposition: A | Discharge status: Home / Self Care | Payer: BC Managed Care – PPO | Attending: Emergency Medicine | Admitting: Emergency Medicine

## 2018-11-15 ENCOUNTER — Other Ambulatory Visit: Payer: Self-pay

## 2018-11-15 DIAGNOSIS — N23 Unspecified renal colic: Secondary | ICD-10-CM | POA: Diagnosis not present

## 2018-11-15 DIAGNOSIS — R109 Unspecified abdominal pain: Secondary | ICD-10-CM | POA: Diagnosis present

## 2018-11-15 DIAGNOSIS — Z79899 Other long term (current) drug therapy: Secondary | ICD-10-CM | POA: Diagnosis not present

## 2018-11-15 LAB — I-STAT BETA HCG BLOOD, ED (MC, WL, AP ONLY): I-stat hCG, quantitative: <5 m[IU]/mL (ref <5)

## 2018-11-15 LAB — URINALYSIS, ROUTINE W REFLEX MICROSCOPIC
Bacteria, UA: NONE SEEN
Bilirubin Urine: NEGATIVE
Glucose, UA: NEGATIVE mg/dL
Ketones, ur: NEGATIVE mg/dL
LEUKOCYTES UA: NEGATIVE
Nitrite: NEGATIVE
PH: 5 (ref 5.0–8.0)
Protein, ur: 100 mg/dL — AB
Specific Gravity, Urine: 1.026 (ref 1.005–1.030)

## 2018-11-15 LAB — CBC WITH DIFFERENTIAL/PLATELET
Abs Immature Granulocytes: 0.01 10*3/uL (ref 0.00–0.07)
Basophils Absolute: 0.1 10*3/uL (ref 0.0–0.1)
Basophils Relative: 1 %
Eosinophils Absolute: 0.1 10*3/uL (ref 0.0–0.5)
Eosinophils Relative: 2 %
HCT: 43.5 % (ref 36.0–46.0)
Hemoglobin: 13.4 g/dL (ref 12.0–15.0)
Immature Granulocytes: 0 %
Lymphocytes Relative: 35 %
Lymphs Abs: 3 10*3/uL (ref 0.7–4.0)
MCH: 27.7 pg (ref 26.0–34.0)
MCHC: 30.8 g/dL (ref 30.0–36.0)
MCV: 90.1 fL (ref 80.0–100.0)
MONO ABS: 0.8 10*3/uL (ref 0.1–1.0)
Monocytes Relative: 9 %
Neutro Abs: 4.6 10*3/uL (ref 1.7–7.7)
Neutrophils Relative %: 53 %
Platelets: 292 10*3/uL (ref 150–400)
RBC: 4.83 MIL/uL (ref 3.87–5.11)
RDW: 13.1 % (ref 11.5–15.5)
WBC: 8.6 10*3/uL (ref 4.0–10.5)
nRBC: 0 % (ref 0.0–0.2)

## 2018-11-15 LAB — BASIC METABOLIC PANEL
Anion gap: 8 (ref 5–15)
BUN: 14 mg/dL (ref 6–20)
CALCIUM: 8.8 mg/dL — AB (ref 8.9–10.3)
CO2: 24 mmol/L (ref 22–32)
CREATININE: 0.87 mg/dL (ref 0.44–1.00)
Chloride: 107 mmol/L (ref 98–111)
GFR calc Af Amer: >60 mL/min (ref >60)
GFR calc non Af Amer: >60 mL/min (ref >60)
Glucose, Bld: 104 mg/dL — ABNORMAL HIGH (ref 70–99)
Potassium: 3.7 mmol/L (ref 3.5–5.1)
Sodium: 139 mmol/L (ref 135–145)

## 2018-11-15 MED ORDER — OXYCODONE-ACETAMINOPHEN 5-325 MG PO TABS: 1.0000 | ORAL_TABLET | ORAL | 0 refills | Status: DC | PRN

## 2018-11-15 MED ORDER — KETOROLAC TROMETHAMINE 60 MG/2ML IM SOLN
60.0000 mg | Freq: Once | INTRAMUSCULAR | Status: AC
  Administered 2018-11-15: 60 mg via INTRAMUSCULAR
  Filled 2018-11-15: qty 2

## 2018-11-15 MED ORDER — HYDROMORPHONE HCL 2 MG/ML IJ SOLN
2.0000 mg | Freq: Once | INTRAMUSCULAR | Status: AC
  Administered 2018-11-15: 2 mg via INTRAMUSCULAR
  Filled 2018-11-15: qty 1

## 2018-11-15 MED ORDER — ONDANSETRON 8 MG PO TBDP
8.0000 mg | ORAL_TABLET | Freq: Once | ORAL | Status: AC
  Administered 2018-11-15: 8 mg via ORAL
  Filled 2018-11-15: qty 1

## 2018-11-15 MED ORDER — TAMSULOSIN HCL 0.4 MG PO CAPS: 0.4000 mg | ORAL_CAPSULE | Freq: Every day | ORAL | 0 refills | Status: DC

## 2018-11-15 NOTE — ED Notes (Signed)
Patient transported to CT 

## 2018-11-15 NOTE — ED Triage Notes (Signed)
Pt c/o left side flank pain that woke her up from sleep with nausea,

## 2018-11-15 NOTE — ED Provider Notes (Signed)
[facility] EMERGENCY DEPARTMENT Provider Note   CSN: [id number] Arrival date & time: 11/15/18  0226     History   Chief Complaint Chief Complaint  Patient presents with  . Flank Pain    HPI Angelica Crawford is a 32 y.o. female.  Patient presents to the emergency department for evaluation of left flank pain.  Patient reports that she was awakened from sleep 40 minutes ago with severe stabbing pain in the left flank accompanied by nausea.  She does have a history of chronic back pain but this is different than the pain she has had.  She is concerned it may be a kidney stone, although she has never had a kidney stone previously.     Past Medical History:  Diagnosis Date  . Back pain     There are no active problems to display for this patient.   History reviewed. No pertinent surgical history.   OB History   None      Home Medications    Prior to Admission medications   Medication Sig Start Date End Date Taking? Authorizing Provider  BIOTIN PO Take 2 each by mouth daily. *Gummies   Yes [provider]  cyclobenzaprine (FLEXERIL) 10 MG tablet Take 1 tablet (10 mg total) by mouth 3 (three) times daily as needed for muscle spasms. 11/11/18  Yes Delo, Riley Lamouglas, MD  FLUoxetine (PROZAC) 40 MG capsule Take 40 mg by mouth daily. 08/15/18  Yes [provider]  ibuprofen (ADVIL,MOTRIN) 200 MG tablet Take 400 mg by mouth every 6 (six) hours as needed for mild pain or moderate pain.   Yes [provider]  Lactobacillus (PROBIOTIC ACIDOPHILUS PO) Take 2 each by mouth daily.   Yes [provider]  Multiple Vitamin (MULTIVITAMIN WITH MINERALS) TABS tablet Take 1 tablet by mouth daily.   Yes [provider]  predniSONE (DELTASONE) 10 MG tablet Take 2 tablets (20 mg total) by mouth 2 (two) times daily. 11/11/18  Yes Delo, Riley Lamouglas, MD  dexamethasone (DECADRON) 4 MG tablet Take 1 tablet (4 mg total) by mouth 2 (two) times daily with a meal.  11/04/18   Ivery QualeBryant, Hobson, PA-C  diazepam (VALIUM) 5 MG tablet Take 1 tablet (5 mg total) by mouth every 8 (eight) hours as needed for muscle spasms. 05/09/18   Burgess AmorIdol, Julie, PA-C  oxyCODONE-acetaminophen (PERCOCET) 5-325 MG tablet Take 1-2 tablets by mouth every 4 (four) hours as needed for moderate pain or severe pain. 11/15/18   Gilda CreasePollina, Shayleen Eppinger J, MD  oxyCODONE-acetaminophen (PERCOCET) 5-325 MG tablet Take 1-2 tablets by mouth every 4 (four) hours as needed. 11/15/18   Gilda CreasePollina, Ryman Rathgeber J, MD  tamsulosin (FLOMAX) 0.4 MG CAPS capsule Take 1 capsule (0.4 mg total) by mouth daily. 11/15/18   Gilda CreasePollina, Marylon Verno J, MD    Family History Family History  Problem Relation Age of Onset  . Multiple sclerosis Mother   . Cancer Mother   . Breast cancer Mother     Social History Social History   Tobacco Use  . Smoking status: Never Smoker  . Smokeless tobacco: Never Used  Substance Use Topics  . Alcohol use: No  . Drug use: No     Allergies   Penicillins; Sulfa antibiotics; and Gabapentin   Review of Systems Review of Systems  Gastrointestinal: Positive for nausea.  Genitourinary: Positive for flank pain.  All other systems reviewed and are negative.    Physical Exam Updated Vital Signs BP (!) 125/92   Pulse 99  Temp (!) 97.4 F (36.3 C) (Oral)   Resp 17   Ht 5\' 2"  (1.575 m)   Wt 135.2 kg   LMP 10/28/2018   SpO2 97%   BMI 54.50 kg/m   Physical Exam  Constitutional: She is oriented to person, place, and time. She appears well-developed and well-nourished. No distress.  HENT:  Head: Normocephalic and atraumatic.  Right Ear: Hearing normal.  Left Ear: Hearing normal.  Nose: Nose normal.  Mouth/Throat: Oropharynx is clear and moist and mucous membranes are normal.  Eyes: Pupils are equal, round, and reactive to light. Conjunctivae and EOM are normal.  Neck: Normal range of motion. Neck supple.  Cardiovascular: Regular rhythm, S1 normal and S2 normal. Exam reveals  no gallop and no friction rub.  No murmur heard. Pulmonary/Chest: Effort normal and breath sounds normal. No respiratory distress. She exhibits no tenderness.  Abdominal: Soft. Normal appearance and bowel sounds are normal. There is no hepatosplenomegaly. There is no tenderness. There is no rebound, no guarding, no tenderness at McBurney's point and negative Murphy's sign. No hernia.  Musculoskeletal: Normal range of motion.       Lumbar back: She exhibits tenderness.       Back:  Neurological: She is alert and oriented to person, place, and time. She has normal strength. No cranial nerve deficit or sensory deficit. Coordination normal. GCS eye subscore is 4. GCS verbal subscore is 5. GCS motor subscore is 6.  Skin: Skin is warm, dry and intact. No rash noted. No cyanosis.  Psychiatric: She has a normal mood and affect. Her speech is normal and behavior is normal. Thought content normal.  Nursing note and vitals reviewed.    ED Treatments / Results  Labs (all labs ordered are listed, but only abnormal results are displayed) Labs Reviewed  BASIC METABOLIC PANEL - Abnormal; Notable for the following components:      Result Value   Glucose, Bld 104 (*)    Calcium 8.8 (*)    All other components within normal limits  URINALYSIS, ROUTINE W REFLEX MICROSCOPIC - Abnormal; Notable for the following components:   APPearance HAZY (*)    Hgb urine dipstick LARGE (*)    Protein, ur 100 (*)    RBC / HPF >50 (*)    All other components within normal limits  CBC WITH DIFFERENTIAL/PLATELET  I-STAT BETA HCG BLOOD, ED ([facility], [facility], [facility] ONLY)    EKG None  Radiology Ct Renal Stone Study  Result Date: 11/15/2018 CLINICAL DATA:  Initial evaluation for acute flank pain, stone disease suspected. EXAM: CT ABDOMEN AND PELVIS WITHOUT CONTRAST TECHNIQUE: Multidetector CT imaging of the abdomen and pelvis was performed following the standard protocol without IV contrast. COMPARISON:  None available. FINDINGS:  Lower chest: Visualized lung bases are clear. Hepatobiliary: Liver demonstrates a normal unenhanced appearance. Gallbladder within normal limits. No biliary dilatation. Pancreas: Pancreas within normal limits. Spleen: Spleen within normal limits. Adrenals/Urinary Tract: Adrenal glands are normal. There is a punctate 2 mm obstructive stone at the left UPJ with secondary mild left hydronephrosis (series 5, image 76). No other radiopaque calculi seen distally within the left ureter. No other stones seen within the left kidney. On the right, scattered nonobstructive calculi measuring up to 3 mm noted. No obstructive calculi seen along the course of the right renal collecting system. No right-sided hydronephrosis or hydroureter. Partially distended bladder within normal limits. No layering stones within the bladder lumen. Stomach/Bowel: Stomach within normal limits. No evidence for bowel obstruction. Normal  appendix. No acute inflammatory changes seen about the bowels. Vascular/Lymphatic: Intra-abdominal aorta of normal caliber. No adenopathy. Reproductive: Uterus and ovaries within normal limits. Other: No free air or fluid. Fat containing paraumbilical hernia without associated inflammation. Musculoskeletal: No acute osseous abnormality. No discrete lytic or blastic osseous lesions. Mild sclerotic changes noted about the SI joints bilaterally, right greater than left. IMPRESSION: 1. Punctate 2 mm obstructive stone at the left UPJ with secondary mild left hydronephrosis. 2. Additional nonobstructive right renal nephrolithiasis as above. 3. No other acute intra-abdominal or pelvic process. Electronically Signed   By: Rise Mu M.D.   On: 11/15/2018 04:48    Procedures Procedures (including critical care time)  Medications Ordered in ED Medications  ketorolac (TORADOL) injection 60 mg (has no administration in time range)  HYDROmorphone (DILAUDID) injection 2 mg (2 mg Intramuscular Given 11/15/18 0325)    ondansetron (ZOFRAN-ODT) disintegrating tablet 8 mg (8 mg Oral Given 11/15/18 0325)     Initial Impression / Assessment and Plan / ED Course  I have reviewed the triage vital signs and the nursing notes.  Pertinent labs & imaging results that were available during my care of the patient were reviewed by me and considered in my medical decision making (see chart for details).     She presents to the emergency department for evaluation of left flank pain with acute nausea.  Pain began suddenly tonight.  He has never had similar pain.  Urinalysis did reveal large blood, CT scan shows 2 mm stone with mild hydronephrosis.  No sign of urinary tract infection.  Final Clinical Impressions(s) / ED Diagnoses   Final diagnoses:  Ureteral colic    ED Discharge Orders         Ordered    tamsulosin (FLOMAX) 0.4 MG CAPS capsule  Daily     11/15/18 0502    oxyCODONE-acetaminophen (PERCOCET) 5-325 MG tablet  Every 4 hours PRN     11/15/18 0502    oxyCODONE-acetaminophen (PERCOCET) 5-325 MG tablet  Every 4 hours PRN     11/15/18 0503           Gilda Crease, MD 11/15/18 [phone]

## 2018-11-16 MED FILL — Oxycodone w/ Acetaminophen Tab 5-325 MG: ORAL | Qty: 6 | Status: AC

## 2018-12-21 ENCOUNTER — Other Ambulatory Visit: Payer: Self-pay | Admitting: Urology

## 2018-12-21 NOTE — Progress Notes (Signed)
Reviewed pt for pre-op interview.  Noted pt BMI 54.52 as of 11-15-2018 at ED visit.  Pt does meet anesthesia guidelines for BMI below 50  for [facility].  Called and lvm for coni, or scheduler for dr Vernie Ammons, that will need to be done at main [facility] OR.

## 2018-12-28 ENCOUNTER — Ambulatory Visit (HOSPITAL_COMMUNITY): Admit: 2018-12-28 | Payer: BC Managed Care – PPO | Admitting: Urology

## 2018-12-28 ENCOUNTER — Encounter (HOSPITAL_COMMUNITY): Payer: Self-pay

## 2018-12-28 SURGERY — CYSTOSCOPY/URETEROSCOPY/HOLMIUM LASER/STENT PLACEMENT
Anesthesia: General | Laterality: Left

## 2018-12-30 ENCOUNTER — Emergency Department (HOSPITAL_COMMUNITY)
Admission: EM | Admit: 2018-12-30 | Discharge: 2018-12-30 | Disposition: A | Discharge status: Home / Self Care | Payer: BC Managed Care – PPO | Attending: Emergency Medicine | Admitting: Emergency Medicine

## 2018-12-30 ENCOUNTER — Other Ambulatory Visit: Payer: Self-pay

## 2018-12-30 ENCOUNTER — Emergency Department (HOSPITAL_COMMUNITY): Payer: BC Managed Care – PPO

## 2018-12-30 ENCOUNTER — Encounter (HOSPITAL_COMMUNITY): Payer: Self-pay | Admitting: Emergency Medicine

## 2018-12-30 DIAGNOSIS — Z87442 Personal history of urinary calculi: Secondary | ICD-10-CM | POA: Diagnosis not present

## 2018-12-30 DIAGNOSIS — R1032 Left lower quadrant pain: Secondary | ICD-10-CM | POA: Insufficient documentation

## 2018-12-30 DIAGNOSIS — R109 Unspecified abdominal pain: Secondary | ICD-10-CM

## 2018-12-30 DIAGNOSIS — Z79899 Other long term (current) drug therapy: Secondary | ICD-10-CM | POA: Insufficient documentation

## 2018-12-30 HISTORY — DX: Calculus of kidney: N20.0

## 2018-12-30 LAB — CBC
HCT: 45.3 % (ref 36.0–46.0)
Hemoglobin: 14.1 g/dL (ref 12.0–15.0)
MCH: 28 pg (ref 26.0–34.0)
MCHC: 31.1 g/dL (ref 30.0–36.0)
MCV: 89.9 fL (ref 80.0–100.0)
Platelets: 322 10*3/uL (ref 150–400)
RBC: 5.04 MIL/uL (ref 3.87–5.11)
RDW: 14.2 % (ref 11.5–15.5)
WBC: 9.7 10*3/uL (ref 4.0–10.5)
nRBC: 0 % (ref 0.0–0.2)

## 2018-12-30 LAB — COMPREHENSIVE METABOLIC PANEL
ALBUMIN: 3.8 g/dL (ref 3.5–5.0)
ALT: 26 U/L (ref 0–44)
AST: 14 U/L — ABNORMAL LOW (ref 15–41)
Alkaline Phosphatase: 65 U/L (ref 38–126)
Anion gap: 8 (ref 5–15)
BUN: 17 mg/dL (ref 6–20)
CO2: 25 mmol/L (ref 22–32)
Calcium: 9.2 mg/dL (ref 8.9–10.3)
Chloride: 104 mmol/L (ref 98–111)
Creatinine, Ser: 0.77 mg/dL (ref 0.44–1.00)
GFR calc Af Amer: >60 mL/min (ref >60)
GFR calc non Af Amer: >60 mL/min (ref >60)
GLUCOSE: 91 mg/dL (ref 70–99)
Potassium: 4.1 mmol/L (ref 3.5–5.1)
Sodium: 137 mmol/L (ref 135–145)
Total Bilirubin: 0.8 mg/dL (ref 0.3–1.2)
Total Protein: 7.8 g/dL (ref 6.5–8.1)

## 2018-12-30 LAB — PREGNANCY, URINE: Preg Test, Ur: NEGATIVE

## 2018-12-30 LAB — LIPASE, BLOOD: Lipase: 39 U/L (ref 11–51)

## 2018-12-30 LAB — URINALYSIS, ROUTINE W REFLEX MICROSCOPIC
Bilirubin Urine: NEGATIVE
Glucose, UA: NEGATIVE mg/dL
Hgb urine dipstick: NEGATIVE
Ketones, ur: NEGATIVE mg/dL
Leukocytes, UA: NEGATIVE
Nitrite: NEGATIVE
Protein, ur: NEGATIVE mg/dL
Specific Gravity, Urine: 1.02 (ref 1.005–1.030)
pH: 6 (ref 5.0–8.0)

## 2018-12-30 MED ORDER — ONDANSETRON HCL 4 MG/2ML IJ SOLN
4.0000 mg | Freq: Once | INTRAMUSCULAR | Status: AC
  Administered 2018-12-30: 4 mg via INTRAVENOUS
  Filled 2018-12-30: qty 2

## 2018-12-30 MED ORDER — KETOROLAC TROMETHAMINE 10 MG PO TABS: 10.0000 mg | ORAL_TABLET | Freq: Four times a day (QID) | ORAL | 0 refills | Status: DC | PRN

## 2018-12-30 MED ORDER — MORPHINE SULFATE (PF) 4 MG/ML IV SOLN
4.0000 mg | Freq: Once | INTRAVENOUS | Status: AC
  Administered 2018-12-30: 4 mg via INTRAVENOUS
  Filled 2018-12-30: qty 1

## 2018-12-30 MED ORDER — OXYCODONE-ACETAMINOPHEN 5-325 MG PO TABS: 1.0000 | ORAL_TABLET | ORAL | 0 refills | Status: DC | PRN

## 2018-12-30 MED ORDER — KETOROLAC TROMETHAMINE 30 MG/ML IJ SOLN
30.0000 mg | Freq: Once | INTRAMUSCULAR | Status: AC
  Administered 2018-12-30: 30 mg via INTRAVENOUS
  Filled 2018-12-30: qty 1

## 2018-12-30 MED ORDER — TAMSULOSIN HCL 0.4 MG PO CAPS: 0.4000 mg | ORAL_CAPSULE | Freq: Every day | ORAL | 0 refills | Status: DC

## 2018-12-30 MED ORDER — SODIUM CHLORIDE 0.9 % IV BOLUS
500.0000 mL | Freq: Once | INTRAVENOUS | Status: AC
  Administered 2018-12-30: 500 mL via INTRAVENOUS

## 2018-12-30 NOTE — ED Notes (Signed)
edp in room  

## 2018-12-30 NOTE — Discharge Instructions (Addendum)
Urine sample was normal tonight.  Plain x-ray did not show any obvious stone.  Prescriptions for Toradol, Percocet, Flomax.  Call the urologist tomorrow morning for follow-up.

## 2018-12-30 NOTE — ED Triage Notes (Signed)
Patient reports abdominal pain, diaphoresis, and urinary retention that started 3 days ago. Patient has recent history of kidney stone that was supposed to be removed on Monday. Patient states she had not been having symptoms and had decided not to have the procedure.

## 2018-12-30 NOTE — ED Notes (Signed)
Patient transported to X-ray 

## 2018-12-31 NOTE — ED Provider Notes (Signed)
[facility] EMERGENCY DEPARTMENT Provider Note   CSN: [id number] Arrival date & time: 12/30/18  1547     History   Chief Complaint Chief Complaint  Patient presents with  . Abdominal Pain    HPI Angelica Crawford is a 33 y.o. female.  Left lower quadrant abdominal pain for 3 days.  She is status post left-sided 2 mm kidney stone on December 1.  She was scheduled for urological surgery this past Monday for this problem, but she canceled the surgery secondary to her feeling better.  Review of systems positive for diaphoresis, nausea, urinary frequency.  She is urinating frequently, but no dysuria or hematuria.  Severity is 8 out of 10.  Nothing makes symptoms better or worse.     Past Medical History:  Diagnosis Date  . Back pain   . Kidney stones     There are no active problems to display for this patient.   History reviewed. No pertinent surgical history.   OB History    Gravida      Para      Term      Preterm      AB      Living  0     SAB      TAB      Ectopic      Multiple      Live Births               Home Medications    Prior to Admission medications   Medication Sig Start Date End Date Taking? Authorizing Provider  BIOTIN PO Take 2 each by mouth daily. *Gummies   Yes [provider]  diazepam (VALIUM) 5 MG tablet Take 1 tablet (5 mg total) by mouth every 8 (eight) hours as needed for muscle spasms. 05/09/18  Yes Idol, Raynelle FanningJulie, PA-C  FLUoxetine (PROZAC) 40 MG capsule Take 40 mg by mouth daily. 08/15/18  Yes [provider]  Lactobacillus (PROBIOTIC ACIDOPHILUS PO) Take 2 each by mouth daily.   Yes [provider]  Multiple Vitamin (MULTIVITAMIN WITH MINERALS) TABS tablet Take 1 tablet by mouth daily.   Yes [provider]  tamsulosin (FLOMAX) 0.4 MG CAPS capsule Take 1 capsule (0.4 mg total) by mouth daily. 11/15/18  Yes Pollina, Canary Brimhristopher J, MD  cyclobenzaprine (FLEXERIL) 10 MG tablet Take 1 tablet (10  mg total) by mouth 3 (three) times daily as needed for muscle spasms. Patient not taking: Reported on 12/30/2018 11/11/18   Geoffery Lyonselo, Douglas, MD  dexamethasone (DECADRON) 4 MG tablet Take 1 tablet (4 mg total) by mouth 2 (two) times daily with a meal. Patient not taking: Reported on 12/30/2018 11/04/18   Ivery QualeBryant, Hobson, PA-C  ketorolac (TORADOL) 10 MG tablet Take 1 tablet (10 mg total) by mouth every 6 (six) hours as needed. 12/30/18   Donnetta Hutchingook, Marveen Donlon, MD  oxyCODONE-acetaminophen (PERCOCET) 5-325 MG tablet Take 1-2 tablets by mouth every 4 (four) hours as needed for moderate pain or severe pain. Patient not taking: Reported on 12/30/2018 11/15/18   Gilda CreasePollina, Christopher J, MD  oxyCODONE-acetaminophen (PERCOCET) 5-325 MG tablet Take 1-2 tablets by mouth every 4 (four) hours as needed. Patient not taking: Reported on 12/30/2018 11/15/18   Gilda CreasePollina, Christopher J, MD  oxyCODONE-acetaminophen (PERCOCET) 5-325 MG tablet Take 1 tablet by mouth every 4 (four) hours as needed. 12/30/18   Donnetta Hutchingook, Ramin Zoll, MD  predniSONE (DELTASONE) 10 MG tablet Take 2 tablets (20 mg total) by mouth 2 (two) times daily. Patient not taking:  Reported on 12/30/2018 11/11/18   Geoffery Lyons, MD  tamsulosin (FLOMAX) 0.4 MG CAPS capsule Take 1 capsule (0.4 mg total) by mouth daily. 12/30/18   Donnetta Hutching, MD    Family History Family History  Problem Relation Age of Onset  . Multiple sclerosis Mother   . Cancer Mother   . Breast cancer Mother     Social History Social History   Tobacco Use  . Smoking status: Never Smoker  . Smokeless tobacco: Never Used  Substance Use Topics  . Alcohol use: Yes    Comment: occas  . Drug use: No     Allergies   Penicillins; Sulfa antibiotics; and Gabapentin   Review of Systems Review of Systems  All other systems reviewed and are negative.    Physical Exam Updated Vital Signs BP 107/75   Pulse 90   Temp 97.9 F (36.6 C) (Oral)   Resp 18   Ht 5\' 2"  (1.575 m)   Wt 135.2 kg   LMP  12/21/2018   SpO2 97%   BMI 54.50 kg/m   Physical Exam Vitals signs and nursing note reviewed.  Constitutional:      Appearance: She is well-developed.     Comments: Elevated bmi  HENT:     Head: Normocephalic and atraumatic.  Eyes:     Conjunctiva/sclera: Conjunctivae normal.  Neck:     Musculoskeletal: Neck supple.  Cardiovascular:     Rate and Rhythm: Normal rate and regular rhythm.  Pulmonary:     Effort: Pulmonary effort is normal.     Breath sounds: Normal breath sounds.  Abdominal:     General: Bowel sounds are normal.     Palpations: Abdomen is soft.     Comments: Minimal left lower quadrant tenderness.  Musculoskeletal: Normal range of motion.  Skin:    General: Skin is warm and dry.  Neurological:     Mental Status: She is alert and oriented to person, place, and time.  Psychiatric:        Behavior: Behavior normal.      ED Treatments / Results  Labs (all labs ordered are listed, but only abnormal results are displayed) Labs Reviewed  COMPREHENSIVE METABOLIC PANEL - Abnormal; Notable for the following components:      Result Value   AST 14 (*)    All other components within normal limits  LIPASE, BLOOD  CBC  URINALYSIS, ROUTINE W REFLEX MICROSCOPIC  PREGNANCY, URINE    EKG None  Radiology Dg Abdomen 1 View  Result Date: 12/30/2018 CLINICAL DATA:  Left sided abdominal pain, diaphoresis, and urinary retention that started 3 days ago. Stated she has known kidney stone 2mm since early Dec. Denies surgery. EXAM: ABDOMEN - 1 VIEW COMPARISON:  12/18/2018.  CT, 12/07/2018. FINDINGS: Normal bowel gas pattern. No visible renal or ureteral stones. Tiny right renal stone noted on the prior CT is not resolved. Tiny stone noted in the mid to distal left ureter on the prior CT also not resolved radiographically. Soft tissues are unremarkable.  No skeletal abnormality. IMPRESSION: 1. Negative exam. Small left ureteral stone and small right intrarenal stone noted on  the prior CT are not visualized, likely too small to be resolved. Ureteral stone has possibly passed. Electronically Signed   By: Amie Portland M.D.   On: 12/30/2018 20:44    Procedures Procedures (including critical care time)  Medications Ordered in ED Medications  ondansetron (ZOFRAN) injection 4 mg (4 mg Intravenous Given 12/30/18 2006)  sodium chloride 0.9 %  bolus 500 mL (0 mLs Intravenous Stopped 12/30/18 2139)  morphine 4 MG/ML injection 4 mg (4 mg Intravenous Given 12/30/18 2006)  ketorolac (TORADOL) 30 MG/ML injection 30 mg (30 mg Intravenous Given 12/30/18 2006)  morphine 4 MG/ML injection 4 mg (4 mg Intravenous Given 12/30/18 2139)     Initial Impression / Assessment and Plan / ED Course  I have reviewed the triage vital signs and the nursing notes.  Pertinent labs & imaging results that were available during my care of the patient were reviewed by me and considered in my medical decision making (see chart for details).     Patient presents with persistent left lower quadrant pain.  Urinalysis and KUB showed no findings.  I tried to avoid a CT scan secondary to multiple recent exposures.  Pain management.  Referral to urology.  Discharge medication Toradol 10 mg, Percocet, Flomax 0.4 mg.  Final Clinical Impressions(s) / ED Diagnoses   Final diagnoses:  Left flank pain    ED Discharge Orders         Ordered    ketorolac (TORADOL) 10 MG tablet  Every 6 hours PRN     12/30/18 2151    oxyCODONE-acetaminophen (PERCOCET) 5-325 MG tablet  Every 4 hours PRN     12/30/18 2151    tamsulosin (FLOMAX) 0.4 MG CAPS capsule  Daily     12/30/18 2151           Donnetta Hutchingook, Zakery [place], MD 12/31/18 2044

## 2019-01-10 ENCOUNTER — Emergency Department (HOSPITAL_COMMUNITY)
Admission: EM | Admit: 2019-01-10 | Discharge: 2019-01-10 | Disposition: A | Discharge status: Home / Self Care | Payer: BC Managed Care – PPO | Attending: Emergency Medicine | Admitting: Emergency Medicine

## 2019-01-10 ENCOUNTER — Emergency Department (HOSPITAL_COMMUNITY): Payer: BC Managed Care – PPO

## 2019-01-10 ENCOUNTER — Other Ambulatory Visit: Payer: Self-pay

## 2019-01-10 ENCOUNTER — Encounter (HOSPITAL_COMMUNITY): Payer: Self-pay | Admitting: *Deleted

## 2019-01-10 DIAGNOSIS — R109 Unspecified abdominal pain: Secondary | ICD-10-CM

## 2019-01-10 DIAGNOSIS — Z79899 Other long term (current) drug therapy: Secondary | ICD-10-CM | POA: Diagnosis not present

## 2019-01-10 DIAGNOSIS — R1032 Left lower quadrant pain: Secondary | ICD-10-CM | POA: Insufficient documentation

## 2019-01-10 LAB — URINALYSIS, ROUTINE W REFLEX MICROSCOPIC
Bilirubin Urine: NEGATIVE
Glucose, UA: NEGATIVE mg/dL
Hgb urine dipstick: NEGATIVE
Ketones, ur: NEGATIVE mg/dL
Nitrite: NEGATIVE
Protein, ur: 30 mg/dL — AB
Specific Gravity, Urine: 1.03 (ref 1.005–1.030)
pH: 5 (ref 5.0–8.0)

## 2019-01-10 LAB — PREGNANCY, URINE: PREG TEST UR: NEGATIVE

## 2019-01-10 MED ORDER — ONDANSETRON HCL 4 MG/2ML IJ SOLN
4.0000 mg | Freq: Once | INTRAMUSCULAR | Status: AC
  Administered 2019-01-10: 4 mg via INTRAVENOUS
  Filled 2019-01-10: qty 2

## 2019-01-10 MED ORDER — KETOROLAC TROMETHAMINE 30 MG/ML IJ SOLN
30.0000 mg | Freq: Once | INTRAMUSCULAR | Status: AC
  Administered 2019-01-10: 30 mg via INTRAVENOUS
  Filled 2019-01-10: qty 1

## 2019-01-10 MED ORDER — HYDROMORPHONE HCL 1 MG/ML IJ SOLN
1.0000 mg | Freq: Once | INTRAMUSCULAR | Status: AC
  Administered 2019-01-10: 1 mg via INTRAVENOUS
  Filled 2019-01-10: qty 1

## 2019-01-10 MED ORDER — IBUPROFEN 800 MG PO TABS: 800.0000 mg | ORAL_TABLET | Freq: Three times a day (TID) | ORAL | 0 refills | Status: DC | PRN

## 2019-01-10 NOTE — ED Triage Notes (Signed)
Pt c/o left side flank pain with urgency and nausea and chills; pt states she has been dealing with this pain for the last month

## 2019-01-10 NOTE — ED Provider Notes (Signed)
MSE was initiated and I personally evaluated the patient and placed orders (if any) at  6:22 AM on January 10, 2019.  The patient appears stable so that the remainder of the MSE may be completed by another provider.  Patient was diagnosed with a left ureteral stone at the [facility] on December 1.  She states she has been having persistent pain and has been followed at [facility] urology by Dr. Vernie Ammons.  She was supposed to have a procedure to have it removed on January 7 however she did not have any pain for for 5 days and she was not feeling well so she canceled the test.  The following week she started having the pain returned.  She was seen last Wednesday, January 22 and had another CT scan which showed the stone was still at the [facility] (that study is not in our system).  They stated they wanted to give it a couple more weeks to see if she will pass it on her own.  She states that 2 AM she was awakened from sleep with left flank pain radiating to her left lower quadrant.  She has had nausea without vomiting.  She denies dysuria or frequency but does have urgency and dribbling.  She denies any hematuria or fever although she states she has had chills.  This is her first kidney stone and it does not run in the family.  Patient has some left CVA tenderness, her abdomen is soft and nontender.  Luminary orders were done, urinalysis, ultrasound since she has had several CTs for the same problem, and she had an IV and was given IV Toradol and Zofran.     Devoria Albe, MD 01/10/19 [phone]

## 2019-01-10 NOTE — Discharge Instructions (Addendum)
Call your urologist tomorrow to schedule next available appointment.

## 2019-01-10 NOTE — ED Provider Notes (Addendum)
[facility] EMERGENCY DEPARTMENT Provider Note   CSN: [id number] Arrival date & time: 01/10/19  4098     History   Chief Complaint Chief Complaint  Patient presents with  . Flank Pain    HPI Angelica Crawford is a 33 y.o. female.  Complains of left-sided flank pain typical of kidney stone onset November 15, 2018.  Pain became worse last night pain is worse with changing positions or with deep breaths.  Not improved with anything.  She was treated with IV Toradol and IV Zofran prior to my exam, without relief of pain however her nausea has resolved.  Associated symptoms include nausea no vomiting.  No abdominal pain.  No shortness of breath.  No other associated symptoms.  HPI  Past Medical History:  Diagnosis Date  . Back pain   . Kidney stones     There are no active problems to display for this patient.   History reviewed. No pertinent surgical history.   OB History    Gravida      Para      Term      Preterm      AB      Living  0     SAB      TAB      Ectopic      Multiple      Live Births               Home Medications    Prior to Admission medications   Medication Sig Start Date End Date Taking? Authorizing Provider  BIOTIN PO Take 2 each by mouth daily. *Gummies    [provider]  cyclobenzaprine (FLEXERIL) 10 MG tablet Take 1 tablet (10 mg total) by mouth 3 (three) times daily as needed for muscle spasms. Patient not taking: Reported on 12/30/2018 11/11/18   Geoffery Lyons, MD  dexamethasone (DECADRON) 4 MG tablet Take 1 tablet (4 mg total) by mouth 2 (two) times daily with a meal. Patient not taking: Reported on 12/30/2018 11/04/18   Ivery Quale, PA-C  diazepam (VALIUM) 5 MG tablet Take 1 tablet (5 mg total) by mouth every 8 (eight) hours as needed for muscle spasms. 05/09/18   Burgess Amor, PA-C  FLUoxetine (PROZAC) 40 MG capsule Take 40 mg by mouth daily. 08/15/18   [provider]  ketorolac (TORADOL) 10 MG tablet Take  1 tablet (10 mg total) by mouth every 6 (six) hours as needed. 12/30/18   Donnetta Hutching, MD  Lactobacillus (PROBIOTIC ACIDOPHILUS PO) Take 2 each by mouth daily.    [provider]  Multiple Vitamin (MULTIVITAMIN WITH MINERALS) TABS tablet Take 1 tablet by mouth daily.    [provider]  oxyCODONE-acetaminophen (PERCOCET) 5-325 MG tablet Take 1-2 tablets by mouth every 4 (four) hours as needed for moderate pain or severe pain. Patient not taking: Reported on 12/30/2018 11/15/18   Gilda Crease, MD  oxyCODONE-acetaminophen (PERCOCET) 5-325 MG tablet Take 1-2 tablets by mouth every 4 (four) hours as needed. Patient not taking: Reported on 12/30/2018 11/15/18   Gilda Crease, MD  oxyCODONE-acetaminophen (PERCOCET) 5-325 MG tablet Take 1 tablet by mouth every 4 (four) hours as needed. 12/30/18   Donnetta Hutching, MD  predniSONE (DELTASONE) 10 MG tablet Take 2 tablets (20 mg total) by mouth 2 (two) times daily. Patient not taking: Reported on 12/30/2018 11/11/18   Geoffery Lyons, MD  tamsulosin (FLOMAX) 0.4 MG CAPS capsule Take 1 capsule (0.4 mg total) by mouth  daily. 11/15/18   Gilda CreasePollina, Christopher J, MD  tamsulosin (FLOMAX) 0.4 MG CAPS capsule Take 1 capsule (0.4 mg total) by mouth daily. 12/30/18   Donnetta Hutchingook, Brian, MD    Family History Family History  Problem Relation Age of Onset  . Multiple sclerosis Mother   . Cancer Mother   . Breast cancer Mother     Social History Social History   Tobacco Use  . Smoking status: Never Smoker  . Smokeless tobacco: Never Used  Substance Use Topics  . Alcohol use: Yes    Comment: occas  . Drug use: No     Allergies   Penicillins; Sulfa antibiotics; and Gabapentin   Review of Systems Review of Systems  Constitutional: Negative.   HENT: Negative.   Respiratory: Negative.   Cardiovascular: Negative.   Gastrointestinal: Positive for nausea.  Genitourinary: Positive for flank pain.  Skin: Negative.   Neurological: Negative.    Psychiatric/Behavioral: Negative.   All other systems reviewed and are negative.    Physical Exam Updated Vital Signs BP (!) 141/99 (BP Location: Left Arm)   Pulse 87   Temp (!) 97.5 F (36.4 C) (Oral)   Resp (!) 22   Ht 5\' 2"  (1.575 m)   Wt 135 kg   LMP 12/21/2018   SpO2 99%   BMI 54.44 kg/m   Physical Exam Vitals signs and nursing note reviewed.  Constitutional:      General: She is not in acute distress.    Appearance: She is well-developed. She is obese. She is not ill-appearing.  HENT:     Head: Normocephalic and atraumatic.  Eyes:     Conjunctiva/sclera: Conjunctivae normal.     Pupils: Pupils are equal, round, and reactive to light.  Neck:     Musculoskeletal: Neck supple.     Thyroid: No thyromegaly.     Trachea: No tracheal deviation.  Cardiovascular:     Rate and Rhythm: Normal rate and regular rhythm.     Heart sounds: No murmur.  Pulmonary:     Effort: Pulmonary effort is normal.     Breath sounds: Normal breath sounds.  Abdominal:     General: Bowel sounds are normal. There is no distension.     Palpations: Abdomen is soft.     Tenderness: There is no abdominal tenderness.     Comments: Obese  Genitourinary:    Comments: Left flank tenderness pain exacerbated by 6 sitting upright from supine position Musculoskeletal: Normal range of motion.        General: No tenderness.  Skin:    General: Skin is warm and dry.     Findings: No rash.  Neurological:     Mental Status: She is alert.     Coordination: Coordination normal.      ED Treatments / Results  Labs (all labs ordered are listed, but only abnormal results are displayed) Labs Reviewed  URINALYSIS, ROUTINE W REFLEX MICROSCOPIC - Abnormal; Notable for the following components:      Result Value   APPearance CLOUDY (*)    Protein, ur 30 (*)    Leukocytes, UA TRACE (*)    Bacteria, UA MANY (*)    All other components within normal limits  URINE CULTURE  PREGNANCY, URINE   Results  for orders placed or performed during the hospital encounter of 01/10/19  Pregnancy, urine  Result Value Ref Range   Preg Test, Ur NEGATIVE NEGATIVE  Urinalysis, Routine w reflex microscopic  Result Value Ref Range  Color, Urine YELLOW YELLOW   APPearance CLOUDY (A) CLEAR   Specific Gravity, Urine 1.030 1.005 - 1.030   pH 5.0 5.0 - 8.0   Glucose, UA NEGATIVE NEGATIVE mg/dL   Hgb urine dipstick NEGATIVE NEGATIVE   Bilirubin Urine NEGATIVE NEGATIVE   Ketones, ur NEGATIVE NEGATIVE mg/dL   Protein, ur 30 (A) NEGATIVE mg/dL   Nitrite NEGATIVE NEGATIVE   Leukocytes, UA TRACE (A) NEGATIVE   RBC / HPF 6-10 0 - 5 RBC/hpf   WBC, UA 11-20 0 - 5 WBC/hpf   Bacteria, UA MANY (A) NONE SEEN   Squamous Epithelial / LPF 21-50 0 - 5   Mucus PRESENT    Dg Abdomen 1 View  Result Date: 12/30/2018 CLINICAL DATA:  Left sided abdominal pain, diaphoresis, and urinary retention that started 3 days ago. Stated she has known kidney stone 2mm since early Dec. Denies surgery. EXAM: ABDOMEN - 1 VIEW COMPARISON:  12/18/2018.  CT, 12/07/2018. FINDINGS: Normal bowel gas pattern. No visible renal or ureteral stones. Tiny right renal stone noted on the prior CT is not resolved. Tiny stone noted in the mid to distal left ureter on the prior CT also not resolved radiographically. Soft tissues are unremarkable.  No skeletal abnormality. IMPRESSION: 1. Negative exam. Small left ureteral stone and small right intrarenal stone noted on the prior CT are not visualized, likely too small to be resolved. Ureteral stone has possibly passed. Electronically Signed   By: Amie Portland M.D.   On: 12/30/2018 20:44   US Renal  Result Date: 01/10/2019 CLINICAL DATA:  Left flank pain.  Left UVJ stone. EXAM: RENAL / URINARY TRACT ULTRASOUND COMPLETE COMPARISON:  01/05/2019 FINDINGS: Right Kidney: Renal measurements: 11.1 x 4.9 by 4.8 cm = volume: 135.7 ML . Echogenicity within normal limits. No mass or hydronephrosis visualized. Left Kidney:  Renal measurements: 10.7 x 5.5 x 5.2 cm. = volume: 161.2 mL. Echogenicity within normal limits. No mass or hydronephrosis visualized. Bladder: Appears normal for degree of bladder distention. IMPRESSION: 1. Normal renal sonogram.  No hydronephrosis identified. Electronically Signed   By: Signa Kell M.D.   On: 01/10/2019 10:36   EKG None  Radiology No results found.  Procedures Procedures (including critical care time)  Medications Ordered in ED Medications  HYDROmorphone (DILAUDID) injection 1 mg (has no administration in time range)  ketorolac (TORADOL) 30 MG/ML injection 30 mg (30 mg Intravenous Given 01/10/19 0655)  ondansetron (ZOFRAN) injection 4 mg (4 mg Intravenous Given 01/10/19 0655)    855 a.m. patient states pain is not controlled.  Presently 6 out of 10.  She feels improved with lying in prone position no nausea presently.  Additional IV hydromorphone ordered. Initial Impression / Assessment and Plan / ED Course  I have reviewed the triage vital signs and the nursing notes.  Pertinent labs & imaging results that were available during my care of the patient were reviewed by me and considered in my medical decision making (see chart for details).    Pain is not classic for ureteral colic is constant and worse with changing positions and improved with other positions.  Has history of back pain 11:55 PM patient states that pain continues.  She has now somewhat changed her story and states that pain is not affected by changing position and goes from left flank to the left groin.  Plan prescription ibuprofen.  She has been given multiple prescriptions for oxycodone.  And also has run out of Toradol.  [employer] [place] Controlled Substance  reporting System queried I suggest she follow-up with urologist tomorrow Final Clinical Impressions(s) / ED Diagnoses  Diagnosis left flank pain Final diagnoses:  None    ED Discharge Orders    None       Doug SouJacubowitz, Zidane Renner, MD 01/10/19  1159    Doug SouJacubowitz, Alyzza Andringa, MD 01/10/19 1450

## 2019-01-10 NOTE — ED Notes (Signed)
Patient refused to sign DC.

## 2019-01-12 LAB — URINE CULTURE
Culture: <10000 — AB
SPECIAL REQUESTS: NORMAL

## 2019-04-18 ENCOUNTER — Emergency Department (HOSPITAL_COMMUNITY): Payer: BC Managed Care – PPO

## 2019-04-18 ENCOUNTER — Encounter (HOSPITAL_COMMUNITY): Payer: Self-pay | Admitting: *Deleted

## 2019-04-18 ENCOUNTER — Other Ambulatory Visit: Payer: Self-pay

## 2019-04-18 ENCOUNTER — Emergency Department (HOSPITAL_COMMUNITY)
Admission: EM | Admit: 2019-04-18 | Discharge: 2019-04-18 | Disposition: A | Discharge status: Home / Self Care | Payer: BC Managed Care – PPO | Attending: Emergency Medicine | Admitting: Emergency Medicine

## 2019-04-18 DIAGNOSIS — Z87442 Personal history of urinary calculi: Secondary | ICD-10-CM | POA: Diagnosis not present

## 2019-04-18 DIAGNOSIS — N23 Unspecified renal colic: Secondary | ICD-10-CM | POA: Insufficient documentation

## 2019-04-18 DIAGNOSIS — R1031 Right lower quadrant pain: Secondary | ICD-10-CM | POA: Diagnosis present

## 2019-04-18 LAB — CBC WITH DIFFERENTIAL/PLATELET
Abs Immature Granulocytes: 0.01 10*3/uL (ref 0.00–0.07)
Basophils Absolute: 0.1 10*3/uL (ref 0.0–0.1)
Basophils Relative: 1 %
Eosinophils Absolute: 0.2 10*3/uL (ref 0.0–0.5)
Eosinophils Relative: 2 %
HCT: 45.7 % (ref 36.0–46.0)
Hemoglobin: 14.2 g/dL (ref 12.0–15.0)
Immature Granulocytes: 0 %
Lymphocytes Relative: 31 %
Lymphs Abs: 3 10*3/uL (ref 0.7–4.0)
MCH: 28.3 pg (ref 26.0–34.0)
MCHC: 31.1 g/dL (ref 30.0–36.0)
MCV: 91.2 fL (ref 80.0–100.0)
Monocytes Absolute: 1 10*3/uL (ref 0.1–1.0)
Monocytes Relative: 10 %
Neutro Abs: 5.6 10*3/uL (ref 1.7–7.7)
Neutrophils Relative %: 56 %
Platelets: 309 10*3/uL (ref 150–400)
RBC: 5.01 MIL/uL (ref 3.87–5.11)
RDW: 13 % (ref 11.5–15.5)
WBC: 9.8 10*3/uL (ref 4.0–10.5)
nRBC: 0 % (ref 0.0–0.2)

## 2019-04-18 LAB — BASIC METABOLIC PANEL
Anion gap: 10 (ref 5–15)
BUN: 20 mg/dL (ref 6–20)
CO2: 24 mmol/L (ref 22–32)
Calcium: 9.1 mg/dL (ref 8.9–10.3)
Chloride: 105 mmol/L (ref 98–111)
Creatinine, Ser: 1.14 mg/dL — ABNORMAL HIGH (ref 0.44–1.00)
GFR calc Af Amer: >60 mL/min (ref >60)
GFR calc non Af Amer: >60 mL/min (ref >60)
Glucose, Bld: 95 mg/dL (ref 70–99)
Potassium: 3.9 mmol/L (ref 3.5–5.1)
Sodium: 139 mmol/L (ref 135–145)

## 2019-04-18 LAB — URINALYSIS, ROUTINE W REFLEX MICROSCOPIC
Bilirubin Urine: NEGATIVE
Glucose, UA: NEGATIVE mg/dL
Ketones, ur: NEGATIVE mg/dL
Nitrite: NEGATIVE
Protein, ur: 30 mg/dL — AB
RBC / HPF: >50 RBC/hpf — ABNORMAL HIGH (ref 0–5)
Specific Gravity, Urine: 1.028 (ref 1.005–1.030)
pH: 5 (ref 5.0–8.0)

## 2019-04-18 LAB — I-STAT BETA HCG BLOOD, ED (MC, WL, AP ONLY): I-stat hCG, quantitative: <5 m[IU]/mL (ref <5)

## 2019-04-18 MED ORDER — ONDANSETRON HCL 4 MG/2ML IJ SOLN
4.0000 mg | Freq: Once | INTRAMUSCULAR | Status: AC
  Administered 2019-04-18: 4 mg via INTRAVENOUS
  Filled 2019-04-18: qty 2

## 2019-04-18 MED ORDER — HYDROMORPHONE HCL 1 MG/ML IJ SOLN
1.0000 mg | Freq: Once | INTRAMUSCULAR | Status: AC
  Administered 2019-04-18: 1 mg via INTRAVENOUS
  Filled 2019-04-18: qty 1

## 2019-04-18 MED ORDER — OXYCODONE-ACETAMINOPHEN 5-325 MG PO TABS: 1.0000 | ORAL_TABLET | ORAL | 0 refills | Status: DC | PRN

## 2019-04-18 MED ORDER — OXYCODONE-ACETAMINOPHEN 5-325 MG PO TABS: 2.0000 | ORAL_TABLET | ORAL | 0 refills | Status: DC | PRN

## 2019-04-18 MED ORDER — ONDANSETRON HCL 4 MG PO TABS: 4.0000 mg | ORAL_TABLET | Freq: Four times a day (QID) | ORAL | 0 refills | Status: DC | PRN

## 2019-04-18 MED ORDER — TAMSULOSIN HCL 0.4 MG PO CAPS: 0.4000 mg | ORAL_CAPSULE | Freq: Every day | ORAL | 0 refills | Status: DC

## 2019-04-18 MED ORDER — KETOROLAC TROMETHAMINE 30 MG/ML IJ SOLN
30.0000 mg | Freq: Once | INTRAMUSCULAR | Status: AC
  Administered 2019-04-18: 30 mg via INTRAVENOUS
  Filled 2019-04-18: qty 1

## 2019-04-18 NOTE — ED Triage Notes (Signed)
Pt c/o intermittent right side flank pain that started earlier in the day with nausea,

## 2019-04-18 NOTE — ED Provider Notes (Signed)
[facility] EMERGENCY DEPARTMENT Provider Note   CSN: [id number] Arrival date & time: 04/18/19  0106    History   Chief Complaint Chief Complaint  Patient presents with  . Flank Pain    HPI Angelica Crawford is a 33 y.o. female.     Patient presents to the emergency department for evaluation of right flank pain.  She has been having intermittent flank pain through the day but tonight has become persistent.  She reports constant and severe pain in the right lower back that is radiating around to the front.  She has had nausea but no vomiting.  No fever.  Patient reports a history of multiple kidney stones in the past with similar pain.  He has been able to pass her kidney stones previously.     Past Medical History:  Diagnosis Date  . Back pain   . Kidney stones     There are no active problems to display for this patient.   History reviewed. No pertinent surgical history.   OB History    Gravida      Para      Term      Preterm      AB      Living  0     SAB      TAB      Ectopic      Multiple      Live Births               Home Medications    Prior to Admission medications   Medication Sig Start Date End Date Taking? Authorizing Provider  phentermine 37.5 MG capsule Take 37.5 mg by mouth every morning.   Yes [provider]  BIOTIN PO Take 2 each by mouth daily. *Gummies    [provider]  cyclobenzaprine (FLEXERIL) 10 MG tablet Take 1 tablet (10 mg total) by mouth 3 (three) times daily as needed for muscle spasms. Patient not taking: Reported on 12/30/2018 11/11/18   Geoffery Lyonselo, Douglas, MD  dexamethasone (DECADRON) 4 MG tablet Take 1 tablet (4 mg total) by mouth 2 (two) times daily with a meal. Patient not taking: Reported on 12/30/2018 11/04/18   Ivery QualeBryant, Hobson, PA-C  diazepam (VALIUM) 5 MG tablet Take 1 tablet (5 mg total) by mouth every 8 (eight) hours as needed for muscle spasms. 05/09/18   Burgess AmorIdol, Julie, PA-C  FLUoxetine  (PROZAC) 40 MG capsule Take 80 mg by mouth daily.  08/15/18   [provider]  ibuprofen (ADVIL,MOTRIN) 800 MG tablet Take 1 tablet (800 mg total) by mouth every 8 (eight) hours as needed (Left flank pain.  Take with food). 01/10/19   Doug SouJacubowitz, Sam, MD  ketorolac (TORADOL) 10 MG tablet Take 1 tablet (10 mg total) by mouth every 6 (six) hours as needed. 12/30/18   Donnetta Hutchingook, Brian, MD  Lactobacillus (PROBIOTIC ACIDOPHILUS PO) Take 2 each by mouth daily.    [provider]  Multiple Vitamin (MULTIVITAMIN WITH MINERALS) TABS tablet Take 1 tablet by mouth daily.    [provider]  oxyCODONE-acetaminophen (PERCOCET) 5-325 MG tablet Take 1-2 tablets by mouth every 4 (four) hours as needed for moderate pain or severe pain. Patient not taking: Reported on 12/30/2018 11/15/18   Gilda CreasePollina, Rihana Kiddy J, MD  oxyCODONE-acetaminophen (PERCOCET) 5-325 MG tablet Take 1-2 tablets by mouth every 4 (four) hours as needed. Patient not taking: Reported on 12/30/2018 11/15/18   Gilda CreasePollina, Sharanya Templin J, MD  oxyCODONE-acetaminophen (PERCOCET) 5-325 MG tablet Take  1 tablet by mouth every 4 (four) hours as needed. 12/30/18   Donnetta Hutching, MD  predniSONE (DELTASONE) 10 MG tablet Take 2 tablets (20 mg total) by mouth 2 (two) times daily. Patient not taking: Reported on 12/30/2018 11/11/18   Geoffery Lyons, MD  tamsulosin (FLOMAX) 0.4 MG CAPS capsule Take 1 capsule (0.4 mg total) by mouth daily. 11/15/18   Gilda Crease, MD  tamsulosin (FLOMAX) 0.4 MG CAPS capsule Take 1 capsule (0.4 mg total) by mouth daily. 12/30/18   Donnetta Hutching, MD    Family History Family History  Problem Relation Age of Onset  . Multiple sclerosis Mother   . Cancer Mother   . Breast cancer Mother     Social History Social History   Tobacco Use  . Smoking status: Never Smoker  . Smokeless tobacco: Never Used  Substance Use Topics  . Alcohol use: Yes    Comment: occas  . Drug use: No     Allergies   Penicillins;  Sulfa antibiotics; and Gabapentin   Review of Systems Review of Systems  Gastrointestinal: Positive for nausea.  Genitourinary: Positive for flank pain.  All other systems reviewed and are negative.    Physical Exam Updated Vital Signs BP (!) 146/98   Pulse 97   Temp 97.7 F (36.5 C)   Resp (!) 22   Ht  (1.651 m)   Wt 135.2 kg   LMP 04/09/2019   SpO2 100%   BMI 49.59 kg/m   Physical Exam Vitals signs and nursing note reviewed.  Constitutional:      General: She is in acute distress.     Appearance: Normal appearance. She is well-developed.  HENT:     Head: Normocephalic and atraumatic.     Right Ear: Hearing normal.     Left Ear: Hearing normal.     Nose: Nose normal.  Eyes:     Conjunctiva/sclera: Conjunctivae normal.     Pupils: Pupils are equal, round, and reactive to light.  Neck:     Musculoskeletal: Normal range of motion and neck supple.  Cardiovascular:     Rate and Rhythm: Regular rhythm.     Heart sounds: S1 normal and S2 normal. No murmur. No friction rub. No gallop.   Pulmonary:     Effort: Pulmonary effort is normal. No respiratory distress.     Breath sounds: Normal breath sounds.  Chest:     Chest wall: No tenderness.  Abdominal:     General: Bowel sounds are normal.     Palpations: Abdomen is soft.     Tenderness: There is no abdominal tenderness. There is right CVA tenderness. There is no guarding or rebound. Negative signs include Murphy's sign and McBurney's sign.     Hernia: No hernia is present.  Musculoskeletal: Normal range of motion.  Skin:    General: Skin is warm and dry.     Findings: No rash.  Neurological:     Mental Status: She is alert and oriented to person, place, and time.     GCS: GCS eye subscore is 4. GCS verbal subscore is 5. GCS motor subscore is 6.     Cranial Nerves: No cranial nerve deficit.     Sensory: No sensory deficit.     Coordination: Coordination normal.  Psychiatric:        Speech: Speech normal.         Behavior: Behavior normal.        Thought Content: Thought content normal.  ED Treatments / Results  Labs (all labs ordered are listed, but only abnormal results are displayed) Labs Reviewed  BASIC METABOLIC PANEL - Abnormal; Notable for the following components:      Result Value   Creatinine, Ser 1.14 (*)    All other components within normal limits  URINALYSIS, ROUTINE W REFLEX MICROSCOPIC - Abnormal; Notable for the following components:   APPearance CLOUDY (*)    Hgb urine dipstick LARGE (*)    Protein, ur 30 (*)    Leukocytes,Ua SMALL (*)    RBC / HPF >50 (*)    Bacteria, UA RARE (*)    All other components within normal limits  CBC WITH DIFFERENTIAL/PLATELET  I-STAT BETA HCG BLOOD, ED ([facility], [facility], [facility] ONLY)    EKG None  Radiology No results found.  Procedures Procedures (including critical care time)  Medications Ordered in ED Medications  HYDROmorphone (DILAUDID) injection 1 mg (1 mg Intravenous Given 04/18/19 0142)  ondansetron (ZOFRAN) injection 4 mg (4 mg Intravenous Given 04/18/19 0142)     Initial Impression / Assessment and Plan / ED Course  I have reviewed the triage vital signs and the nursing notes.  Pertinent labs & imaging results that were available during my care of the patient were reviewed by me and considered in my medical decision making (see chart for details).       Patient presents to the ER for evaluation of right flank pain.  She reports that this feels similar to renal colic that she has had in the past.  Patient does have mild right CVA tenderness, benign abdominal exam.  Blood work was unremarkable.  Urinalysis does show large blood.  She did have 11-20 white blood cells with rare bacteria.  Reviewing previous records reveals that she had a similar urinalysis but with many bacteria with her last kidney stone.  Urine culture at that time did not grow any pathologic organisms.  She has not had any urinary tract symptoms prior to onset  of the renal colic.  Doubt concomitant infection.  Will order culture.    Patient provided analgesia.  She was still having significant pain after first dose.  Was given Toradol and continued to have pain.  She therefore underwent CT scan to confirm diagnosis.  She does have a 3 mm distal UVJ stone with mild hydronephrosis.  Will discharge with analgesia, Flomax, follow-up with urology.  Patient given return precautions including fever, vomiting and worsening pain.   Final Clinical Impressions(s) / ED Diagnoses   Final diagnoses:  Ureteral colic    ED Discharge Orders    None       Gilda Crease, MD 04/18/19 [phone]

## 2019-04-18 NOTE — ED Notes (Signed)
Patient given perc. prepack and instructions. Pt verbalized understanding

## 2019-04-20 LAB — URINE CULTURE: Culture: 70000 — AB

## 2019-04-21 ENCOUNTER — Telehealth: Payer: Self-pay | Admitting: Emergency Medicine

## 2019-04-21 MED FILL — Oxycodone w/ Acetaminophen Tab 5-325 MG: ORAL | Qty: 6 | Status: AC

## 2019-04-21 NOTE — Telephone Encounter (Signed)
Post ED Visit - Positive Culture Follow-up: Successful Patient Follow-Up  Culture assessed and recommendations reviewed by:  []  Enzo Bi, Pharm.D. []  Celedonio Miyamoto, Pharm.D., BCPS AQ-ID []  Garvin Fila, Pharm.D., BCPS []  Georgina Pillion, Pharm.D., BCPS []  [place], [place].D., BCPS, AAHIVP []  Estella Husk, Pharm.D., BCPS, AAHIVP []  Lysle Pearl, PharmD, BCPS []  Phillips Climes, PharmD, BCPS []  Agapito Games, PharmD, BCPS []  Verlan Friends, PharmD Lenord Carbo PharmD  Positive urine culture  []  Patient discharged without antimicrobial prescription and treatment is now indicated []  Organism is resistant to prescribed ED discharge antimicrobial []  Patient with positive blood cultures  Changes discussed with ED provider: Eyvonne Mechanic PA New antibiotic prescription symptom check, if positive treat with levofloxacin 750mg  po x 7 days  Spoke with patient who stated + symptoms, stated she went to [facility] on yesterday and was given rx there feels much better   Berle Mull 04/21/2019, 10:31 AM

## 2019-04-21 NOTE — Progress Notes (Signed)
ED Antimicrobial Stewardship Positive Culture Follow Up   Angelica Crawford is an 33 y.o. female who presented to [facility] on 04/18/2019 with a chief complaint of right sided flank pain and nausea. CT abd showed obstructive 3 mm kidney stone on right UVJ. Was not treated with antibiotics since symptoms correlated with kidney stone presentation. Patient UCx with 70K colonies/mL Klebsiella pneumoniae. UA nitrite negative, small leukocytes, rare bacteria, 0-5 squamous epithelial cells, 11-20 WBC.   Plan:  Follow-up wellness check If patient without symptoms of UTI, do not treat If patient symptomatic, treat with levofloxacin 750 mg PO for 7 days  Chief Complaint  Patient presents with  . Flank Pain    Recent Results (from the past 720 hour(s))  Urine Culture     Status: Abnormal   Collection Time: 04/18/19  1:24 AM  Result Value Ref Range Status   Specimen Description   Final    URINE, CLEAN CATCH Performed at [facility], [address] [place], [place] [address]    Special Requests   Final    NONE Performed at [facility], [address] [place], [place] [address]    Culture 70,000 COLONIES/mL KLEBSIELLA PNEUMONIAE (A)  Final   Report Status 04/20/2019 FINAL  Final   Organism ID, Bacteria KLEBSIELLA PNEUMONIAE (A)  Final      Susceptibility   Klebsiella pneumoniae - MIC*    AMPICILLIN >=32 RESISTANT Resistant     CEFAZOLIN <=4 SENSITIVE Sensitive     CEFTRIAXONE <=1 SENSITIVE Sensitive     CIPROFLOXACIN <=0.25 SENSITIVE Sensitive     GENTAMICIN <=1 SENSITIVE Sensitive     IMIPENEM <=0.25 SENSITIVE Sensitive     NITROFURANTOIN 128 RESISTANT Resistant     TRIMETH/SULFA <=20 SENSITIVE Sensitive     AMPICILLIN/SULBACTAM 8 SENSITIVE Sensitive     PIP/TAZO <=4 SENSITIVE Sensitive     Extended ESBL NEGATIVE Sensitive     * 70,000 COLONIES/mL KLEBSIELLA PNEUMONIAE    ED Provider: Eyvonne Mechanic, PA-C  Thank you for allowing pharmacy to be a part of this patient's  care.  Lenord Carbo, PharmD PGY1 Pharmacy Resident Phone: [phone]  Please check AMION for all [facility] phone numbers 04/21/2019, 9:56 AM

## 2019-10-17 ENCOUNTER — Emergency Department (HOSPITAL_COMMUNITY): Payer: BC Managed Care – PPO

## 2019-10-17 ENCOUNTER — Emergency Department (HOSPITAL_COMMUNITY)
Admission: EM | Admit: 2019-10-17 | Discharge: 2019-10-17 | Disposition: A | Discharge status: Home / Self Care | Payer: BC Managed Care – PPO | Attending: Emergency Medicine | Admitting: Emergency Medicine

## 2019-10-17 ENCOUNTER — Other Ambulatory Visit: Payer: Self-pay

## 2019-10-17 ENCOUNTER — Encounter (HOSPITAL_COMMUNITY): Payer: Self-pay | Admitting: Emergency Medicine

## 2019-10-17 DIAGNOSIS — Z5321 Procedure and treatment not carried out due to patient leaving prior to being seen by health care provider: Secondary | ICD-10-CM | POA: Diagnosis not present

## 2019-10-17 DIAGNOSIS — R05 Cough: Secondary | ICD-10-CM | POA: Insufficient documentation

## 2019-10-17 NOTE — ED Triage Notes (Signed)
PT c/o congested non-productive cough, headaches, chills, body aches starting yesterday. 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 PT states no OTC medicines for symptoms prior to ED arrival.

## 2019-10-17 NOTE — ED Notes (Signed)
Pt called x1 with no answer.  

## 2019-11-21 ENCOUNTER — Emergency Department (HOSPITAL_COMMUNITY)
Admission: EM | Admit: 2019-11-21 | Discharge: 2019-11-21 | Disposition: A | Discharge status: Home / Self Care | Payer: BC Managed Care – PPO | Attending: Emergency Medicine | Admitting: Emergency Medicine

## 2019-11-21 ENCOUNTER — Emergency Department (HOSPITAL_COMMUNITY): Payer: BC Managed Care – PPO

## 2019-11-21 ENCOUNTER — Encounter (HOSPITAL_COMMUNITY): Payer: Self-pay | Admitting: Emergency Medicine

## 2019-11-21 ENCOUNTER — Other Ambulatory Visit: Payer: Self-pay

## 2019-11-21 DIAGNOSIS — R109 Unspecified abdominal pain: Secondary | ICD-10-CM | POA: Diagnosis present

## 2019-11-21 DIAGNOSIS — N23 Unspecified renal colic: Secondary | ICD-10-CM | POA: Diagnosis not present

## 2019-11-21 DIAGNOSIS — Z79899 Other long term (current) drug therapy: Secondary | ICD-10-CM | POA: Insufficient documentation

## 2019-11-21 LAB — BASIC METABOLIC PANEL
Anion gap: 15 (ref 5–15)
BUN: 12 mg/dL (ref 6–20)
CO2: 22 mmol/L (ref 22–32)
Calcium: 8.9 mg/dL (ref 8.9–10.3)
Chloride: 101 mmol/L (ref 98–111)
Creatinine, Ser: 0.82 mg/dL (ref 0.44–1.00)
GFR calc Af Amer: >60 mL/min (ref >60)
GFR calc non Af Amer: >60 mL/min (ref >60)
Glucose, Bld: 96 mg/dL (ref 70–99)
Potassium: 3.8 mmol/L (ref 3.5–5.1)
Sodium: 138 mmol/L (ref 135–145)

## 2019-11-21 LAB — CBC WITH DIFFERENTIAL/PLATELET
Abs Immature Granulocytes: 0.03 10*3/uL (ref 0.00–0.07)
Basophils Absolute: 0 10*3/uL (ref 0.0–0.1)
Basophils Relative: 1 %
Eosinophils Absolute: 0.2 10*3/uL (ref 0.0–0.5)
Eosinophils Relative: 2 %
HCT: 43.9 % (ref 36.0–46.0)
Hemoglobin: 13.9 g/dL (ref 12.0–15.0)
Immature Granulocytes: 0 %
Lymphocytes Relative: 27 %
Lymphs Abs: 2.2 10*3/uL (ref 0.7–4.0)
MCH: 28.1 pg (ref 26.0–34.0)
MCHC: 31.7 g/dL (ref 30.0–36.0)
MCV: 88.7 fL (ref 80.0–100.0)
Monocytes Absolute: 0.6 10*3/uL (ref 0.1–1.0)
Monocytes Relative: 7 %
Neutro Abs: 5.1 10*3/uL (ref 1.7–7.7)
Neutrophils Relative %: 63 %
Platelets: 307 10*3/uL (ref 150–400)
RBC: 4.95 MIL/uL (ref 3.87–5.11)
RDW: 13 % (ref 11.5–15.5)
WBC: 8.1 10*3/uL (ref 4.0–10.5)
nRBC: 0 % (ref 0.0–0.2)

## 2019-11-21 LAB — URINALYSIS, ROUTINE W REFLEX MICROSCOPIC
Bilirubin Urine: NEGATIVE
Glucose, UA: NEGATIVE mg/dL
Hgb urine dipstick: NEGATIVE
Ketones, ur: NEGATIVE mg/dL
Leukocytes,Ua: NEGATIVE
Nitrite: NEGATIVE
Protein, ur: NEGATIVE mg/dL
Specific Gravity, Urine: 1.015 (ref 1.005–1.030)
pH: 6 (ref 5.0–8.0)

## 2019-11-21 LAB — PREGNANCY, URINE: Preg Test, Ur: NEGATIVE

## 2019-11-21 MED ORDER — TAMSULOSIN HCL 0.4 MG PO CAPS: 0.4000 mg | ORAL_CAPSULE | Freq: Every day | ORAL | 0 refills | Status: DC

## 2019-11-21 MED ORDER — KETOROLAC TROMETHAMINE 30 MG/ML IJ SOLN
30.0000 mg | Freq: Once | INTRAMUSCULAR | Status: AC
  Administered 2019-11-21: 30 mg via INTRAVENOUS
  Filled 2019-11-21: qty 1

## 2019-11-21 MED ORDER — ONDANSETRON HCL 4 MG/2ML IJ SOLN
4.0000 mg | Freq: Once | INTRAMUSCULAR | Status: AC
  Administered 2019-11-21: 4 mg via INTRAVENOUS
  Filled 2019-11-21: qty 2

## 2019-11-21 MED ORDER — HYDROMORPHONE HCL 1 MG/ML IJ SOLN
1.0000 mg | Freq: Once | INTRAMUSCULAR | Status: AC
  Administered 2019-11-21: 17:00:00 1 mg via INTRAVENOUS
  Filled 2019-11-21: qty 1

## 2019-11-21 MED ORDER — HYDROMORPHONE HCL 1 MG/ML IJ SOLN
1.0000 mg | Freq: Once | INTRAMUSCULAR | Status: AC
  Administered 2019-11-21: 1 mg via INTRAVENOUS
  Filled 2019-11-21: qty 1

## 2019-11-21 MED ORDER — OXYCODONE-ACETAMINOPHEN 5-325 MG PO TABS: 2.0000 | ORAL_TABLET | ORAL | 0 refills | Status: DC | PRN

## 2019-11-21 MED ORDER — ONDANSETRON HCL 4 MG PO TABS: 4.0000 mg | ORAL_TABLET | Freq: Four times a day (QID) | ORAL | 0 refills | Status: DC

## 2019-11-21 MED ORDER — OXYCODONE-ACETAMINOPHEN 5-325 MG PO TABS: 1.0000 | ORAL_TABLET | ORAL | 0 refills | Status: DC | PRN

## 2019-11-21 NOTE — ED Notes (Signed)
Patient unable to urinate at this time. 

## 2019-11-21 NOTE — ED Triage Notes (Signed)
Patient c/o right flank pain that radiates into right lower abd and groin. Per patient started this morning with nausea. Denies any vomiting, diarrhea, or fevers. Patient reports decrease in urinary flow with frequency. Patient has hx of kidney stones.

## 2019-11-21 NOTE — ED Provider Notes (Signed)
[facility] EMERGENCY DEPARTMENT Provider Note   CSN: [id number] Arrival date & time: 11/21/19  1540     History   Chief Complaint Chief Complaint  Patient presents with  . Flank Pain    HPI Angelica Crawford is a 33 y.o. female.     HPI   Angelica Crawford is a 33 y.o. female with PMH of kidney stones,  presents to the Emergency Department complaining of gradually worsening right flank pain, nausea and urinary frequency with decreased urinary volume.  States pain woke her from sleep and feels similar to previous episodes of kidney stones. She describes intense pain from the right flank that radiates to her right groin. She has been taking Flomax without relief. She denies fever, chills, vomiting, burning with urination, abnormal vaginal bleeding, leg pain and abdominal pain.  Nothing makes her symptoms better or worse   Past Medical History:  Diagnosis Date  . Back pain   . Kidney stones     There are no active problems to display for this patient.   History reviewed. No pertinent surgical history.   OB History    Gravida  0   Para  0   Term  0   Preterm  0   AB  0   Living  0     SAB  0   TAB  0   Ectopic  0   Multiple  0   Live Births  0            Home Medications    Prior to Admission medications   Medication Sig Start Date End Date Taking? Authorizing Provider  FLUoxetine (PROZAC) 40 MG capsule Take 80 mg by mouth daily.  08/15/18   [provider]  ondansetron (ZOFRAN) 4 MG tablet Take 1 tablet (4 mg total) by mouth every 6 (six) hours as needed for nausea or vomiting. 04/18/19   Gilda Crease, MD  oxyCODONE-acetaminophen (PERCOCET) 5-325 MG tablet Take 2 tablets by mouth every 4 (four) hours as needed. 04/18/19   Gilda Crease, MD  oxyCODONE-acetaminophen (PERCOCET) 5-325 MG tablet Take 1-2 tablets by mouth every 4 (four) hours as needed. 04/18/19   Gilda Crease, MD  phentermine 37.5 MG capsule Take 37.5 mg  by mouth every morning.    [provider]  tamsulosin (FLOMAX) 0.4 MG CAPS capsule Take 1 capsule (0.4 mg total) by mouth daily. 04/18/19   Gilda Crease, MD    Family History Family History  Problem Relation Age of Onset  . Multiple sclerosis Mother   . Cancer Mother   . Breast cancer Mother     Social History Social History   Tobacco Use  . Smoking status: Never Smoker  . Smokeless tobacco: Never Used  Substance Use Topics  . Alcohol use: Yes    Comment: occas  . Drug use: No     Allergies   Penicillins, Sulfa antibiotics, and Gabapentin   Review of Systems Review of Systems  Constitutional: Negative for activity change, appetite change, chills and fever.  Respiratory: Negative for chest tightness and shortness of breath.   Gastrointestinal: Positive for nausea. Negative for abdominal pain and vomiting.  Genitourinary: Positive for flank pain and frequency. Negative for decreased urine volume, difficulty urinating, dysuria, hematuria, urgency, vaginal bleeding and vaginal discharge.  Musculoskeletal: Negative for back pain.  Skin: Negative for rash.  Neurological: Negative for weakness.  Hematological: Negative for adenopathy.     Physical Exam Updated Vital  Signs BP 110/86 (BP Location: Right Arm)   Pulse (!) 104   Temp 98.1 F (36.7 C) (Oral)   Resp 18   Ht 5\' 5"  (1.651 m)   Wt 123.8 kg   LMP 11/14/2019   SpO2 98%   BMI 45.43 kg/m   Physical Exam Vitals signs and nursing note reviewed.  Constitutional:      General: She is not in acute distress.    Appearance: She is well-developed.     Comments: Pt is uncomfortable appearing, swaying back and forth sitting on stretcher.  Cardiovascular:     Rate and Rhythm: Normal rate and regular rhythm.  Pulmonary:     Effort: Pulmonary effort is normal. No respiratory distress.     Breath sounds: Normal breath sounds. No wheezing or rales.  Abdominal:     General: There is no distension.      Palpations: Abdomen is soft. Abdomen is not rigid. There is no mass.     Tenderness: There is no abdominal tenderness. There is no right CVA tenderness, left CVA tenderness, guarding or rebound. Negative signs include McBurney's sign.  Musculoskeletal: Normal range of motion.     Comments: No bony tenderness of lumbar spine.  Neg SLR bilaterally  Skin:    General: Skin is warm.     Findings: No rash.  Neurological:     Mental Status: She is alert.     Sensory: No sensory deficit.     Motor: No weakness.      ED Treatments / Results  Labs (all labs ordered are listed, but only abnormal results are displayed) Labs Reviewed  URINALYSIS, ROUTINE W REFLEX MICROSCOPIC  PREGNANCY, URINE  BASIC METABOLIC PANEL  CBC WITH DIFFERENTIAL/PLATELET    EKG None  Radiology Ct Renal Stone Study  Result Date: 11/21/2019 CLINICAL DATA:  Flank pain. EXAM: CT ABDOMEN AND PELVIS WITHOUT CONTRAST TECHNIQUE: Multidetector CT imaging of the abdomen and pelvis was performed following the standard protocol without IV contrast. COMPARISON:  Apr 18, 2019 FINDINGS: Lower chest: The lung bases are clear. The heart size is normal. Hepatobiliary: The liver is normal. Normal gallbladder.There is no biliary ductal dilation. Pancreas: Normal contours without ductal dilatation. No peripancreatic fluid collection. Spleen: No splenic laceration or hematoma. Adrenals/Urinary Tract: --Adrenal glands: No adrenal hemorrhage. --Right kidney/ureter: There is a punctate nonobstructing stone in the upper pole the right kidney. --Left kidney/ureter: There is a punctate nonobstructing stone in the upper pole the left kidney. --Urinary bladder: The urinary bladder is decompressed which limits evaluation. Stomach/Bowel: --Stomach/Duodenum: No hiatal hernia or other gastric abnormality. Normal duodenal course and caliber. --Small bowel: No dilatation or inflammation. --Colon: No focal abnormality. --Appendix: Normal. Vascular/Lymphatic:  Normal course and caliber of the major abdominal vessels. --No retroperitoneal lymphadenopathy. --No mesenteric lymphadenopathy. --No pelvic or inguinal lymphadenopathy. Reproductive: Unremarkable Other: No ascites or free air. There is a fat containing periumbilical hernia. Musculoskeletal. No acute displaced fractures. IMPRESSION: 1. No acute abdominopelvic abnormality. 2. Fat containing periumbilical hernia. 3. Bilateral nonobstructive nephrolithiasis. Electronically Signed   By: Constance Holster M.D.   On: 11/21/2019 19:09    Procedures Procedures (including critical care time)  Medications Ordered in ED Medications  HYDROmorphone (DILAUDID) injection 1 mg (1 mg Intravenous Given 11/21/19 1648)  ondansetron (ZOFRAN) injection 4 mg (4 mg Intravenous Given 11/21/19 1648)  ketorolac (TORADOL) 30 MG/ML injection 30 mg (30 mg Intravenous Given 11/21/19 1751)  HYDROmorphone (DILAUDID) injection 1 mg (1 mg Intravenous Given 11/21/19 1814)   (has no  administration in time range)     Initial Impression / Assessment and Plan / ED Course  I have reviewed the triage vital signs and the nursing notes.  Pertinent labs & imaging results that were available during my care of the patient were reviewed by me and considered in my medical decision making (see chart for details).        Pt is uncomfortable appearing.  Hx of kidney stones.  No fever, chills.  Will obtain labs, U/A and CT renal stone study.  On recheck, pt reports feeling better after IV medications.  CT stone study doesn't show evidence of uretral stone or hydronephrosis.  Pt states that previous episode from 5/20 also did not show radiographic evidence of uretral stone.    She appears to be feeling better and appropriate for d/c home.  Vitals reviewed.  Agrees to close urology f/u, rx for pain medication, anti-emetic, and Flomax.     Final Clinical Impressions(s) / ED Diagnoses   Final diagnoses:  Right flank pain  Renal colic on  right side    ED Discharge Orders    None       Pauline Ausriplett, Cherie Lasalle, PA-C 11/22/19 1314    Vanetta MuldersZackowski, Scott, MD 11/24/19 1515

## 2019-11-21 NOTE — Discharge Instructions (Signed)
Call [facility] tomorrow to arrange a follow-up appointment.  Take the Flomax once daily.  Return to the ER for any worsening symptoms such as increasing pain, vomiting, or fever

## 2019-11-21 NOTE — ED Notes (Signed)
Patient transported to CT 

## 2019-11-21 NOTE — ED Notes (Signed)
Lab contacted concerning pregnancy test.

## 2019-11-22 MED FILL — Oxycodone w/ Acetaminophen Tab 5-325 MG: ORAL | Qty: 6 | Status: AC

## 2019-11-29 ENCOUNTER — Emergency Department (HOSPITAL_COMMUNITY)
Admission: EM | Admit: 2019-11-29 | Discharge: 2019-11-29 | Disposition: A | Discharge status: Home / Self Care | Payer: BC Managed Care – PPO | Attending: Emergency Medicine | Admitting: Emergency Medicine

## 2019-11-29 ENCOUNTER — Other Ambulatory Visit: Payer: Self-pay

## 2019-11-29 ENCOUNTER — Encounter (HOSPITAL_COMMUNITY): Payer: Self-pay | Admitting: Emergency Medicine

## 2019-11-29 DIAGNOSIS — R1032 Left lower quadrant pain: Secondary | ICD-10-CM | POA: Diagnosis present

## 2019-11-29 DIAGNOSIS — R109 Unspecified abdominal pain: Secondary | ICD-10-CM

## 2019-11-29 DIAGNOSIS — Z79899 Other long term (current) drug therapy: Secondary | ICD-10-CM | POA: Insufficient documentation

## 2019-11-29 DIAGNOSIS — N200 Calculus of kidney: Secondary | ICD-10-CM | POA: Insufficient documentation

## 2019-11-29 LAB — URINALYSIS, ROUTINE W REFLEX MICROSCOPIC
Bilirubin Urine: NEGATIVE
Glucose, UA: NEGATIVE mg/dL
Hgb urine dipstick: NEGATIVE
Ketones, ur: NEGATIVE mg/dL
Leukocytes,Ua: NEGATIVE
Nitrite: NEGATIVE
Protein, ur: NEGATIVE mg/dL
Specific Gravity, Urine: 1.023 (ref 1.005–1.030)
pH: 5 (ref 5.0–8.0)

## 2019-11-29 LAB — POC URINE PREG, ED: Preg Test, Ur: NEGATIVE

## 2019-11-29 LAB — PREGNANCY, URINE: Preg Test, Ur: NEGATIVE

## 2019-11-29 MED ORDER — TAMSULOSIN HCL 0.4 MG PO CAPS: 0.4000 mg | ORAL_CAPSULE | Freq: Every day | ORAL | 0 refills | Status: DC

## 2019-11-29 MED ORDER — HYDROMORPHONE HCL 2 MG/ML IJ SOLN
2.0000 mg | Freq: Once | INTRAMUSCULAR | Status: AC
  Administered 2019-11-29: 2 mg via INTRAMUSCULAR
  Filled 2019-11-29: qty 1

## 2019-11-29 MED ORDER — OXYCODONE-ACETAMINOPHEN 5-325 MG PO TABS: 1.0000 | ORAL_TABLET | Freq: Four times a day (QID) | ORAL | 0 refills | Status: DC | PRN

## 2019-11-29 MED ORDER — HYDROMORPHONE HCL 2 MG/ML IJ SOLN
2.0000 mg | Freq: Once | INTRAMUSCULAR | Status: AC
  Administered 2019-11-29: 21:00:00 2 mg via INTRAMUSCULAR
  Filled 2019-11-29: qty 1

## 2019-11-29 MED ORDER — KETOROLAC TROMETHAMINE 60 MG/2ML IM SOLN
60.0000 mg | Freq: Once | INTRAMUSCULAR | Status: AC
  Administered 2019-11-29: 21:00:00 60 mg via INTRAMUSCULAR
  Filled 2019-11-29: qty 2

## 2019-11-29 NOTE — Discharge Instructions (Signed)
Tamsulosin as prescribed.  Percocet as prescribed as needed for pain.  Follow-up with [facility] if your pain is not improving in the next 2 days.

## 2019-11-29 NOTE — ED Triage Notes (Signed)
Pt c/o left flank pain with nausea and very little urine output since this am.

## 2019-11-29 NOTE — ED Provider Notes (Signed)
[facility] EMERGENCY DEPARTMENT Provider Note   CSN: [id number] Arrival date & time: 11/29/19  1821     History Chief Complaint  Patient presents with  . Flank Pain    Angelica Crawford is a 33 y.o. female.  Patient is a 33 year old female with history of kidney stones.  She presents today with complaints of left flank pain.  Patient was seen here 10 days ago with similar complaints.  She had a CT scan that showed multiple punctate calculi in both kidneys.  Pain returned this afternoon.  She denies fevers or chills.  She denies blood in her urine.  The history is provided by the patient.  Flank Pain This is a new problem. The current episode started 12 to 24 hours ago. The problem occurs constantly. The problem has been rapidly worsening. Associated symptoms include abdominal pain. Pertinent negatives include no chest pain. Nothing aggravates the symptoms. Nothing relieves the symptoms. She has tried nothing for the symptoms.       Past Medical History:  Diagnosis Date  . Back pain   . Kidney stones     There are no problems to display for this patient.   History reviewed. No pertinent surgical history.   OB History    Gravida  0   Para  0   Term  0   Preterm  0   AB  0   Living  0     SAB  0   TAB  0   Ectopic  0   Multiple  0   Live Births  0           Family History  Problem Relation Age of Onset  . Multiple sclerosis Mother   . Cancer Mother   . Breast cancer Mother     Social History   Tobacco Use  . Smoking status: Never Smoker  . Smokeless tobacco: Never Used  Substance Use Topics  . Alcohol use: Yes    Comment: occas  . Drug use: No    Home Medications Prior to Admission medications   Medication Sig Start Date End Date Taking? Authorizing Provider  FLUoxetine (PROZAC) 40 MG capsule Take 80 mg by mouth daily.  08/15/18  Yes [provider]  levETIRAcetam (KEPPRA) 500 MG tablet Take 500 mg by mouth 2 (two) times  daily. 11/11/19  Yes [provider]  LORazepam (ATIVAN) 0.5 MG tablet Take 0.5 mg by mouth daily as needed for anxiety.  10/21/19  Yes [provider]  methocarbamol (ROBAXIN) 750 MG tablet Take 750 mg by mouth 2 (two) times daily as needed for muscle spasms.  09/07/19  Yes [provider]  ondansetron (ZOFRAN) 4 MG tablet Take 1 tablet (4 mg total) by mouth every 6 (six) hours. 11/21/19  Yes Triplett, Tammy, PA-C  phentermine 37.5 MG capsule Take 37.5 mg by mouth every morning.   Yes [provider]  SUMAtriptan (IMITREX) 100 MG tablet Take 100 mg by mouth every 2 (two) hours as needed for migraine.  08/25/19  Yes [provider]  tamsulosin (FLOMAX) 0.4 MG CAPS capsule Take 1 capsule (0.4 mg total) by mouth daily. 11/21/19  Yes Triplett, Tammy, PA-C  oxyCODONE-acetaminophen (PERCOCET/ROXICET) 5-325 MG tablet Take 2 tablets by mouth every 4 (four) hours as needed for severe pain. Patient not taking: Reported on 11/29/2019 11/21/19   Triplett, Babette Relic, PA-C  oxyCODONE-acetaminophen (PERCOCET/ROXICET) 5-325 MG tablet Take 1 tablet by mouth every 4 (four) hours as needed. Patient not  taking: Reported on 11/29/2019 11/21/19   Kem Parkinson, PA-C    Allergies    Penicillins, Sulfa antibiotics, and Gabapentin  Review of Systems   Review of Systems  Cardiovascular: Negative for chest pain.  Gastrointestinal: Positive for abdominal pain.  Genitourinary: Positive for flank pain.  All other systems reviewed and are negative.   Physical Exam Updated Vital Signs BP (!) 148/114 (BP Location: Right Arm)   Pulse 84   Temp 98.2 F (36.8 C)   Resp 18   Wt 124 kg   LMP 11/14/2019   SpO2 100%   BMI 45.49 kg/m   Physical Exam Vitals and nursing note reviewed.  Constitutional:      General: She is not in acute distress.    Appearance: She is well-developed. She is not diaphoretic.  HENT:     Head: Normocephalic and atraumatic.  Cardiovascular:     Rate  and Rhythm: Normal rate and regular rhythm.     Heart sounds: No murmur. No friction rub. No gallop.   Pulmonary:     Effort: Pulmonary effort is normal. No respiratory distress.     Breath sounds: Normal breath sounds. No wheezing.  Abdominal:     General: Bowel sounds are normal. There is no distension.     Palpations: Abdomen is soft.     Tenderness: There is no abdominal tenderness. There is left CVA tenderness. There is no guarding or rebound.  Musculoskeletal:        General: Normal range of motion.     Cervical back: Normal range of motion and neck supple.  Skin:    General: Skin is warm and dry.  Neurological:     Mental Status: She is alert and oriented to person, place, and time.     ED Results / Procedures / Treatments   Labs (all labs ordered are listed, but only abnormal results are displayed) Labs Reviewed  URINALYSIS, [place], URINE  POC URINE PREG, ED    EKG None  Radiology No results found.  Procedures Procedures (including critical care time)  Medications Ordered in ED Medications  ketorolac (TORADOL) injection 60 mg (has no administration in time range)  HYDROmorphone (DILAUDID) injection 2 mg (has no administration in time range)    ED Course  I have reviewed the triage vital signs and the nursing notes.  Pertinent labs & imaging results that were available during my care of the patient were reviewed by me and considered in my medical decision making (see chart for details).   Patient presenting here with complaints of left flank pain.  Patient recently seen here noticed with renal calculi.  She returns stating her pain is no better.  Patient appears comfortable, but does have left-sided CVA tenderness.  She has no fever and there is no evidence for infection in her urine.  There is no blood in her urine.  Upon reviewing her imaging studies from prior visit, this shows small bilateral nephrolithiasis, however no  obstructing stones.  Patient's pain treated here in the ER and is feeling better.  She states she is out of the Percocet she was prescribed before.  She will be given another prescription for a limited supply.  She is to follow-up with urology if her symptoms persist.  MDM Rules/Calculators/A&P    Final Clinical Impression(s) / ED Diagnoses Final diagnoses:  None    Rx / DC Orders ED Discharge Orders    None  Geoffery Lyonselo, Margerite Impastato, MD 11/29/19 2138

## 2019-12-11 ENCOUNTER — Encounter (HOSPITAL_COMMUNITY): Payer: Self-pay

## 2019-12-11 ENCOUNTER — Other Ambulatory Visit: Payer: Self-pay

## 2019-12-11 ENCOUNTER — Emergency Department (HOSPITAL_COMMUNITY)
Admission: EM | Admit: 2019-12-11 | Discharge: 2019-12-11 | Disposition: A | Discharge status: Home / Self Care | Payer: BC Managed Care – PPO | Attending: Emergency Medicine | Admitting: Emergency Medicine

## 2019-12-11 DIAGNOSIS — R109 Unspecified abdominal pain: Secondary | ICD-10-CM

## 2019-12-11 DIAGNOSIS — Z79899 Other long term (current) drug therapy: Secondary | ICD-10-CM | POA: Diagnosis not present

## 2019-12-11 DIAGNOSIS — R1031 Right lower quadrant pain: Secondary | ICD-10-CM | POA: Diagnosis not present

## 2019-12-11 DIAGNOSIS — N200 Calculus of kidney: Secondary | ICD-10-CM | POA: Insufficient documentation

## 2019-12-11 LAB — COMPREHENSIVE METABOLIC PANEL
ALT: 20 U/L (ref 0–44)
AST: 14 U/L — ABNORMAL LOW (ref 15–41)
Albumin: 3.1 g/dL — ABNORMAL LOW (ref 3.5–5.0)
Alkaline Phosphatase: 83 U/L (ref 38–126)
Anion gap: 6 (ref 5–15)
BUN: 16 mg/dL (ref 6–20)
CO2: 26 mmol/L (ref 22–32)
Calcium: 8.5 mg/dL — ABNORMAL LOW (ref 8.9–10.3)
Chloride: 104 mmol/L (ref 98–111)
Creatinine, Ser: 0.8 mg/dL (ref 0.44–1.00)
GFR calc Af Amer: >60 mL/min (ref >60)
GFR calc non Af Amer: >60 mL/min (ref >60)
Glucose, Bld: 101 mg/dL — ABNORMAL HIGH (ref 70–99)
Potassium: 3.9 mmol/L (ref 3.5–5.1)
Sodium: 136 mmol/L (ref 135–145)
Total Bilirubin: 0.7 mg/dL (ref 0.3–1.2)
Total Protein: 6.8 g/dL (ref 6.5–8.1)

## 2019-12-11 LAB — CBC WITH DIFFERENTIAL/PLATELET
Abs Immature Granulocytes: 0.02 10*3/uL (ref 0.00–0.07)
Basophils Absolute: 0.1 10*3/uL (ref 0.0–0.1)
Basophils Relative: 1 %
Eosinophils Absolute: 0.3 10*3/uL (ref 0.0–0.5)
Eosinophils Relative: 3 %
HCT: 43.4 % (ref 36.0–46.0)
Hemoglobin: 13.7 g/dL (ref 12.0–15.0)
Immature Granulocytes: 0 %
Lymphocytes Relative: 36 %
Lymphs Abs: 2.8 10*3/uL (ref 0.7–4.0)
MCH: 28.4 pg (ref 26.0–34.0)
MCHC: 31.6 g/dL (ref 30.0–36.0)
MCV: 89.9 fL (ref 80.0–100.0)
Monocytes Absolute: 0.7 10*3/uL (ref 0.1–1.0)
Monocytes Relative: 9 %
Neutro Abs: 4 10*3/uL (ref 1.7–7.7)
Neutrophils Relative %: 51 %
Platelets: 298 10*3/uL (ref 150–400)
RBC: 4.83 MIL/uL (ref 3.87–5.11)
RDW: 13.4 % (ref 11.5–15.5)
WBC: 7.8 10*3/uL (ref 4.0–10.5)
nRBC: 0 % (ref 0.0–0.2)

## 2019-12-11 LAB — URINALYSIS, ROUTINE W REFLEX MICROSCOPIC
Bilirubin Urine: NEGATIVE
Glucose, UA: NEGATIVE mg/dL
Hgb urine dipstick: NEGATIVE
Ketones, ur: NEGATIVE mg/dL
Leukocytes,Ua: NEGATIVE
Nitrite: NEGATIVE
Protein, ur: NEGATIVE mg/dL
Specific Gravity, Urine: 1.024 (ref 1.005–1.030)
pH: 6 (ref 5.0–8.0)

## 2019-12-11 LAB — I-STAT BETA HCG BLOOD, ED (MC, WL, AP ONLY): I-stat hCG, quantitative: <5 m[IU]/mL (ref <5)

## 2019-12-11 MED ORDER — ONDANSETRON HCL 4 MG/2ML IJ SOLN
4.0000 mg | Freq: Once | INTRAMUSCULAR | Status: AC
  Administered 2019-12-11: 4 mg via INTRAVENOUS
  Filled 2019-12-11: qty 2

## 2019-12-11 MED ORDER — TRAMADOL HCL 50 MG PO TABS
50.0000 mg | ORAL_TABLET | Freq: Once | ORAL | Status: AC
  Administered 2019-12-11: 50 mg via ORAL
  Filled 2019-12-11: qty 1

## 2019-12-11 MED ORDER — SODIUM CHLORIDE 0.9 % IV BOLUS
500.0000 mL | Freq: Once | INTRAVENOUS | Status: AC
  Administered 2019-12-11: 500 mL via INTRAVENOUS

## 2019-12-11 MED ORDER — KETOROLAC TROMETHAMINE 30 MG/ML IJ SOLN
15.0000 mg | Freq: Once | INTRAMUSCULAR | Status: AC
  Administered 2019-12-11: 02:00:00 15 mg via INTRAVENOUS
  Filled 2019-12-11: qty 1

## 2019-12-11 MED ORDER — TRAMADOL HCL 50 MG PO TABS: 50.0000 mg | ORAL_TABLET | Freq: Four times a day (QID) | ORAL | 0 refills | Status: DC | PRN

## 2019-12-11 MED ORDER — FENTANYL CITRATE (PF) 100 MCG/2ML IJ SOLN
25.0000 ug | Freq: Once | INTRAMUSCULAR | Status: AC
  Administered 2019-12-11: 25 ug via INTRAVENOUS
  Filled 2019-12-11: qty 2

## 2019-12-11 NOTE — ED Triage Notes (Signed)
Pt with history of kidney stones, right flank pain started approx 6 hours ago, nausea, no vomiting yet.

## 2019-12-11 NOTE — Discharge Instructions (Signed)
As discussed, your evaluation today has been largely reassuring.  But, it is important that you monitor your condition carefully, and do not hesitate to return to the ED if you develop new, or concerning changes in your condition. ? ?Otherwise, please follow-up with your physician for appropriate ongoing care. ? ?

## 2019-12-11 NOTE — ED Provider Notes (Signed)
[facility] EMERGENCY DEPARTMENT Provider Note   CSN: [id number] Arrival date & time: 12/11/19  2229     History Chief Complaint  Patient presents with  . Flank Pain    Angelica Crawford is a 33 y.o. female.  HPI    Patient presents with concern of right flank pain. She notes that since evaluation a few weeks ago here, diagnosis of kidney stones, via CT, labs, urinalysis she has had episodes of pain. However, the pain has been waxing, waning, inconsistent. Tonight, just prior to ED arrival the patient had sudden onset consistent pain in the right flank, atypical for her. Since that time his pain is been persistent, sharp, severe, nonradiating in the right flank, right side. No new vomiting, though she is nauseous, no fever, no other abdominal pain. She does note ongoing urine production, though diminished from baseline.  Beyond history of kidney stones, she denies other substantial medical problems.  Past Medical History:  Diagnosis Date  . Back pain   . Kidney stones     There are no problems to display for this patient.   History reviewed. No pertinent surgical history.   OB History    Gravida  0   Para  0   Term  0   Preterm  0   AB  0   Living  0     SAB  0   TAB  0   Ectopic  0   Multiple  0   Live Births  0           Family History  Problem Relation Age of Onset  . Multiple sclerosis Mother   . Cancer Mother   . Breast cancer Mother     Social History   Tobacco Use  . Smoking status: Never Smoker  . Smokeless tobacco: Never Used  Substance Use Topics  . Alcohol use: Yes    Comment: occas  . Drug use: No    Home Medications Prior to Admission medications   Medication Sig Start Date End Date Taking? Authorizing Provider  SUMAtriptan (IMITREX) 100 MG tablet Take 100 mg by mouth every 2 (two) hours as needed for migraine.  08/25/19 11/29/19  [provider]  FLUoxetine (PROZAC) 40 MG capsule Take 80 mg by mouth daily.   08/15/18   [provider]  levETIRAcetam (KEPPRA) 500 MG tablet Take 500 mg by mouth 2 (two) times daily. 11/11/19   [provider]  LORazepam (ATIVAN) 0.5 MG tablet Take 0.5 mg by mouth daily as needed for anxiety.  10/21/19   [provider]  methocarbamol (ROBAXIN) 750 MG tablet Take 750 mg by mouth 2 (two) times daily as needed for muscle spasms.  09/07/19   [provider]  ondansetron (ZOFRAN) 4 MG tablet Take 1 tablet (4 mg total) by mouth every 6 (six) hours. 11/21/19   Triplett, Tammy, PA-C  oxyCODONE-acetaminophen (PERCOCET) 5-325 MG tablet Take 1-2 tablets by mouth every 6 (six) hours as needed. 11/29/19   Geoffery Lyons, MD  phentermine 37.5 MG capsule Take 37.5 mg by mouth every morning.    [provider]  tamsulosin (FLOMAX) 0.4 MG CAPS capsule Take 1 capsule (0.4 mg total) by mouth daily. 11/29/19   Geoffery Lyons, MD  traMADol (ULTRAM) 50 MG tablet Take 1 tablet (50 mg total) by mouth every 6 (six) hours as needed. 12/11/19   Gerhard Munch, MD    Allergies    Penicillins, Sulfa antibiotics, and Gabapentin  Review of  Systems   Review of Systems  Constitutional:       Per HPI, otherwise negative  HENT:       Per HPI, otherwise negative  Respiratory:       Per HPI, otherwise negative  Cardiovascular:       Per HPI, otherwise negative  Gastrointestinal: Positive for nausea. Negative for vomiting.  Endocrine:       Negative aside from HPI  Genitourinary:       Neg aside from HPI   Musculoskeletal:       Per HPI, otherwise negative  Skin: Negative.   Neurological: Negative for syncope.    Physical Exam Updated Vital Signs BP (!) 129/92 (BP Location: Right Arm)   Pulse 93   Temp 97.8 F (36.6 C) (Oral)   Resp 18   Ht 5\' 5"  (1.651 m)   Wt 124.3 kg   LMP 11/26/2019   SpO2 100%   BMI 45.60 kg/m   Physical Exam Vitals and nursing note reviewed.  Constitutional:      General: She is not in acute distress.     Appearance: She is well-developed.  HENT:     Head: Normocephalic and atraumatic.  Eyes:     Conjunctiva/sclera: Conjunctivae normal.  Cardiovascular:     Rate and Rhythm: Normal rate and regular rhythm.  Pulmonary:     Effort: Pulmonary effort is normal. No respiratory distress.     Breath sounds: Normal breath sounds. No stridor.  Abdominal:     General: There is no distension.     Tenderness: There is no abdominal tenderness. There is no guarding.  Skin:    General: Skin is warm and dry.  Neurological:     Mental Status: She is alert and oriented to person, place, and time.     Cranial Nerves: No cranial nerve deficit.     ED Results / Procedures / Treatments   Labs (all labs ordered are listed, but only abnormal results are displayed) Labs Reviewed  COMPREHENSIVE METABOLIC PANEL - Abnormal; Notable for the following components:      Result Value   Glucose, Bld 101 (*)    Calcium 8.5 (*)    Albumin 3.1 (*)    AST 14 (*)    All other components within normal limits  URINALYSIS, ROUTINE W REFLEX MICROSCOPIC - Abnormal; Notable for the following components:   APPearance HAZY (*)    All other components within normal limits  CBC WITH DIFFERENTIAL/PLATELET  I-STAT BETA HCG BLOOD, ED ([facility], [facility], [facility] ONLY)    EKG None  Radiology No results found.  Procedures Procedures (including critical care time)  Medications Ordered in ED Medications  traMADol (ULTRAM) tablet 50 mg (has no administration in time range)  sodium chloride 0.9 % bolus 500 mL (0 mLs Intravenous Stopped 12/11/19 0241)  ketorolac (TORADOL) 30 MG/ML injection 15 mg (15 mg Intravenous Given 12/11/19 0132)  ondansetron (ZOFRAN) injection 4 mg (4 mg Intravenous Given 12/11/19 0132)  fentaNYL (SUBLIMAZE) injection 25 mcg (25 mcg Intravenous Given 12/11/19 0132)    ED Course  I have reviewed the triage vital signs and the nursing notes.  Pertinent labs & imaging results that were available during my care of  the patient were reviewed by me and considered in my medical decision making (see chart for details).    MDM Rules/Calculators/A&P                     Chart review notable for evaluation  earlier this month, and earlier this year, as well as last year, with multiple CT scans over the past 3 years. 3:50 AM On repeat exam the patient is awake, alert, in no distress.  I we discussed all findings including reassuring urinalysis.  She has been able to urinate. Urinalysis, labs, vital signs and consistent with infection, and with demonstrated capacity urinating, no evidence for complete obstruction either. Patient appropriate for discharge with scheduled follow-up with urology within the week. Patient requests analgesia, and tramadol was provided. Final Clinical Impression(s) / ED Diagnoses Final diagnoses:  Right flank pain  Nephrolithiasis    Rx / DC Orders ED Discharge Orders         Ordered    traMADol (ULTRAM) 50 MG tablet  Every 6 hours PRN     12/11/19 0350           Carmin Muskrat, MD 12/11/19 [phone]

## 2019-12-28 ENCOUNTER — Ambulatory Visit: Payer: BC Managed Care – PPO | Attending: Internal Medicine

## 2019-12-28 ENCOUNTER — Other Ambulatory Visit: Payer: Self-pay

## 2019-12-28 DIAGNOSIS — Z20822 Contact with and (suspected) exposure to covid-19: Secondary | ICD-10-CM

## 2019-12-30 LAB — NOVEL CORONAVIRUS, NAA: SARS-CoV-2, NAA: NOT DETECTED

## 2020-05-24 ENCOUNTER — Emergency Department (HOSPITAL_COMMUNITY)
Admission: EM | Admit: 2020-05-24 | Discharge: 2020-05-24 | Disposition: A | Discharge status: Home / Self Care | Payer: BC Managed Care – PPO | Attending: Emergency Medicine | Admitting: Emergency Medicine

## 2020-05-24 ENCOUNTER — Emergency Department (HOSPITAL_COMMUNITY): Payer: BC Managed Care – PPO

## 2020-05-24 ENCOUNTER — Other Ambulatory Visit: Payer: Self-pay

## 2020-05-24 ENCOUNTER — Encounter (HOSPITAL_COMMUNITY): Payer: Self-pay | Admitting: *Deleted

## 2020-05-24 DIAGNOSIS — Z87442 Personal history of urinary calculi: Secondary | ICD-10-CM | POA: Insufficient documentation

## 2020-05-24 DIAGNOSIS — Z79899 Other long term (current) drug therapy: Secondary | ICD-10-CM | POA: Diagnosis not present

## 2020-05-24 DIAGNOSIS — R109 Unspecified abdominal pain: Secondary | ICD-10-CM

## 2020-05-24 DIAGNOSIS — R110 Nausea: Secondary | ICD-10-CM | POA: Insufficient documentation

## 2020-05-24 LAB — URINALYSIS, ROUTINE W REFLEX MICROSCOPIC
Bilirubin Urine: NEGATIVE
Glucose, UA: NEGATIVE mg/dL
Hgb urine dipstick: NEGATIVE
Ketones, ur: NEGATIVE mg/dL
Leukocytes,Ua: NEGATIVE
Nitrite: NEGATIVE
Protein, ur: NEGATIVE mg/dL
Specific Gravity, Urine: 1.017 (ref 1.005–1.030)
pH: 6 (ref 5.0–8.0)

## 2020-05-24 LAB — BASIC METABOLIC PANEL
Anion gap: 12 (ref 5–15)
BUN: 17 mg/dL (ref 6–20)
CO2: 22 mmol/L (ref 22–32)
Calcium: 8.8 mg/dL — ABNORMAL LOW (ref 8.9–10.3)
Chloride: 103 mmol/L (ref 98–111)
Creatinine, Ser: 0.87 mg/dL (ref 0.44–1.00)
GFR calc Af Amer: >60 mL/min (ref >60)
GFR calc non Af Amer: >60 mL/min (ref >60)
Glucose, Bld: 128 mg/dL — ABNORMAL HIGH (ref 70–99)
Potassium: 4 mmol/L (ref 3.5–5.1)
Sodium: 137 mmol/L (ref 135–145)

## 2020-05-24 LAB — CBC WITH DIFFERENTIAL/PLATELET
Abs Immature Granulocytes: 0.02 10*3/uL (ref 0.00–0.07)
Basophils Absolute: 0.1 10*3/uL (ref 0.0–0.1)
Basophils Relative: 1 %
Eosinophils Absolute: 0.2 10*3/uL (ref 0.0–0.5)
Eosinophils Relative: 3 %
HCT: 40.7 % (ref 36.0–46.0)
Hemoglobin: 13 g/dL (ref 12.0–15.0)
Immature Granulocytes: 0 %
Lymphocytes Relative: 31 %
Lymphs Abs: 2.9 10*3/uL (ref 0.7–4.0)
MCH: 28.3 pg (ref 26.0–34.0)
MCHC: 31.9 g/dL (ref 30.0–36.0)
MCV: 88.7 fL (ref 80.0–100.0)
Monocytes Absolute: 0.9 10*3/uL (ref 0.1–1.0)
Monocytes Relative: 9 %
Neutro Abs: 5.3 10*3/uL (ref 1.7–7.7)
Neutrophils Relative %: 56 %
Platelets: 297 10*3/uL (ref 150–400)
RBC: 4.59 MIL/uL (ref 3.87–5.11)
RDW: 14.7 % (ref 11.5–15.5)
WBC: 9.4 10*3/uL (ref 4.0–10.5)
nRBC: 0 % (ref 0.0–0.2)

## 2020-05-24 LAB — PREGNANCY, URINE: Preg Test, Ur: NEGATIVE

## 2020-05-24 MED ORDER — HYDROMORPHONE HCL 1 MG/ML IJ SOLN
1.0000 mg | Freq: Once | INTRAMUSCULAR | Status: AC
  Administered 2020-05-24: 1 mg via INTRAVENOUS
  Filled 2020-05-24: qty 1

## 2020-05-24 MED ORDER — MORPHINE SULFATE (PF) 4 MG/ML IV SOLN
6.0000 mg | Freq: Once | INTRAVENOUS | Status: AC
  Administered 2020-05-24: 6 mg via INTRAVENOUS
  Filled 2020-05-24: qty 2

## 2020-05-24 MED ORDER — KETOROLAC TROMETHAMINE 30 MG/ML IJ SOLN
15.0000 mg | Freq: Once | INTRAMUSCULAR | Status: AC
  Administered 2020-05-24: 15 mg via INTRAVENOUS
  Filled 2020-05-24: qty 1

## 2020-05-24 MED ORDER — SODIUM CHLORIDE 0.9 % IV BOLUS
500.0000 mL | Freq: Once | INTRAVENOUS | Status: AC
  Administered 2020-05-24: 500 mL via INTRAVENOUS

## 2020-05-24 NOTE — Discharge Instructions (Signed)
Return for new or worsening symptoms

## 2020-05-24 NOTE — ED Provider Notes (Addendum)
[facility] EMERGENCY DEPARTMENT Provider Note   CSN: [id number] Arrival date & time: 05/24/20  2043     History Chief Complaint  Patient presents with  . Flank Pain    Angelica Crawford is a 34 y.o. female.  Patient with history of kidney stones presents with acute right flank pain rating to the groin started 30 minutes prior to arrival.  Overall similar to previous however significantly more severe.  Patient has nausea without vomiting.  No fevers or chills.  No abdominal surgery history.  Pain currently fairly constant.        Past Medical History:  Diagnosis Date  . Back pain   . Kidney stones     There are no problems to display for this patient.   History reviewed. No pertinent surgical history.   OB History    Gravida  0   Para  0   Term  0   Preterm  0   AB  0   Living  0     SAB  0   TAB  0   Ectopic  0   Multiple  0   Live Births  0           Family History  Problem Relation Age of Onset  . Multiple sclerosis Mother   . Cancer Mother   . Breast cancer Mother     Social History   Tobacco Use  . Smoking status: Never Smoker  . Smokeless tobacco: Never Used  Substance Use Topics  . Alcohol use: Yes    Comment: occas  . Drug use: No    Home Medications Prior to Admission medications   Medication Sig Start Date End Date Taking? Authorizing Provider  SUMAtriptan (IMITREX) 100 MG tablet Take 100 mg by mouth every 2 (two) hours as needed for migraine.  08/25/19 11/29/19  [provider]  FLUoxetine (PROZAC) 40 MG capsule Take 80 mg by mouth daily.  08/15/18   [provider]  levETIRAcetam (KEPPRA) 500 MG tablet Take 500 mg by mouth 2 (two) times daily. 11/11/19   [provider]  LORazepam (ATIVAN) 0.5 MG tablet Take 0.5 mg by mouth daily as needed for anxiety.  10/21/19   [provider]  methocarbamol (ROBAXIN) 750 MG tablet Take 750 mg by mouth 2 (two) times daily as needed for muscle spasms.   09/07/19   [provider]  ondansetron (ZOFRAN) 4 MG tablet Take 1 tablet (4 mg total) by mouth every 6 (six) hours. 11/21/19   Triplett, Tammy, PA-C  oxyCODONE-acetaminophen (PERCOCET) 5-325 MG tablet Take 1-2 tablets by mouth every 6 (six) hours as needed. 11/29/19   Geoffery Lyons, MD  phentermine 37.5 MG capsule Take 37.5 mg by mouth every morning.    [provider]  tamsulosin (FLOMAX) 0.4 MG CAPS capsule Take 1 capsule (0.4 mg total) by mouth daily. 11/29/19   Geoffery Lyons, MD  traMADol (ULTRAM) 50 MG tablet Take 1 tablet (50 mg total) by mouth every 6 (six) hours as needed. 12/11/19   Gerhard Munch, MD    Allergies    Penicillins, Sulfa antibiotics, and Gabapentin  Review of Systems   Review of Systems  Constitutional: Negative for chills and fever.  HENT: Negative for congestion.   Eyes: Negative for visual disturbance.  Respiratory: Negative for shortness of breath.   Cardiovascular: Negative for chest pain.  Gastrointestinal: Positive for nausea. Negative for abdominal pain and vomiting.  Genitourinary: Positive for flank pain. Negative for  dysuria.  Musculoskeletal: Negative for back pain, neck pain and neck stiffness.  Skin: Negative for rash.  Neurological: Negative for light-headedness and headaches.    Physical Exam Updated Vital Signs BP (!) 141/125 (BP Location: Right Arm)   Pulse 100   Temp 97.7 F (36.5 C) (Oral)   Resp (!) 22   Ht 5\' 5"  (1.651 m)   Wt 135.2 kg   LMP 05/07/2020   SpO2 97%   BMI 49.59 kg/m   Physical Exam Vitals and nursing note reviewed.  Constitutional:      Appearance: She is well-developed.  HENT:     Head: Normocephalic and atraumatic.  Eyes:     General:        Right eye: No discharge.        Left eye: No discharge.     Conjunctiva/sclera: Conjunctivae normal.  Neck:     Trachea: No tracheal deviation.  Cardiovascular:     Rate and Rhythm: Regular rhythm. Tachycardia present.  Pulmonary:     Effort:  Pulmonary effort is normal.     Breath sounds: Normal breath sounds.  Abdominal:     General: There is no distension.     Palpations: Abdomen is soft.     Tenderness: There is no abdominal tenderness. There is no guarding.  Musculoskeletal:        General: Tenderness (mild right mid flank) present.     Cervical back: Normal range of motion and neck supple.  Skin:    General: Skin is warm.     Findings: No rash.  Neurological:     Mental Status: She is alert and oriented to person, place, and time.  Psychiatric:     Comments: uncomfortable     ED Results / Procedures / Treatments   Labs (all labs ordered are listed, but only abnormal results are displayed) Labs Reviewed  URINALYSIS, ROUTINE W REFLEX MICROSCOPIC - Abnormal; Notable for the following components:      Result Value   APPearance HAZY (*)    All other components within normal limits  BASIC METABOLIC PANEL - Abnormal; Notable for the following components:   Glucose, Bld 128 (*)    Calcium 8.8 (*)    All other components within normal limits  CBC WITH DIFFERENTIAL/PLATELET  PREGNANCY, URINE    EKG None  Radiology CT Renal Stone Study  Result Date: 05/24/2020 CLINICAL DATA:  Right flank pain. EXAM: CT ABDOMEN AND PELVIS WITHOUT CONTRAST TECHNIQUE: Multidetector CT imaging of the abdomen and pelvis was performed following the standard protocol without IV contrast. COMPARISON:  November 21, 2019 FINDINGS: Lower chest: No acute abnormality. Hepatobiliary: No focal liver abnormality is seen. No gallstones, gallbladder wall thickening, or biliary dilatation. Pancreas: Unremarkable. No pancreatic ductal dilatation or surrounding inflammatory changes. Spleen: Normal in size without focal abnormality. Adrenals/Urinary Tract: Adrenal glands are unremarkable. Kidneys are normal, without renal calculi, focal lesion, or hydronephrosis. Bladder is unremarkable. Stomach/Bowel: Stomach is within normal limits. Appendix appears normal.  No evidence of bowel wall thickening, distention, or inflammatory changes. Vascular/Lymphatic: No significant vascular findings are present. No enlarged abdominal or pelvic lymph nodes. Reproductive: Uterus and bilateral adnexa are unremarkable. Other: There is a stable 5.1 cm x 3.2 cm fat containing umbilical hernia. No abdominopelvic ascites. Musculoskeletal: No acute or significant osseous findings. IMPRESSION: No CT evidence of acute intra-abdominal pathology. Electronically Signed   By: November 23, 2019 M.D.   On: 05/24/2020 22:44    Procedures Procedures (including critical care time)  Medications  Ordered in ED Medications  sodium chloride 0.9 % bolus 500 mL (0 mLs Intravenous Stopped 05/24/20 2211)  morphine 4 MG/ML injection 6 mg (6 mg Intravenous Given 05/24/20 2123)  ketorolac (TORADOL) 30 MG/ML injection 15 mg (15 mg Intravenous Given 05/24/20 2210)  HYDROmorphone (DILAUDID) injection 1 mg (1 mg Intravenous Given 05/24/20 2220)    ED Course  I have reviewed the triage vital signs and the nursing notes.  Pertinent labs & imaging results that were available during my care of the patient were reviewed by me and considered in my medical decision making (see chart for details).    MDM Rules/Calculators/A&P                      Patient presents with severe right flank pain discussed clinical concern for kidney stone versus less likely appendicitis/cholecystitis/gallstones/musculoskeletal.  Also on the differential pyelonephritis however less likely with acute onset.  Pain meds given, pain persists, repeat pain medicines given, IV fluids.  CT scan pending, blood work ordered and urinalysis pending.  BMP normal kidney function/creatinine.  Blood work normal wbc, normal Hb.  UA no signs of infection. Pain resolved on reassessment.  CT no kidney or gallstones, outpt fup discussed.   Called patient to check on if pain returned, instructed if it did to return to ED to consider [place] for ovary  pathology/ intermittent torsion.      Final Clinical Impression(s) / ED Diagnoses Final diagnoses:  Acute flank pain    Rx / DC Orders ED Discharge Orders    None       Elnora Morrison, MD 05/24/20 9024    Elnora Morrison, MD 05/25/20 [phone]

## 2020-05-24 NOTE — ED Triage Notes (Signed)
Pt c/o right side flank pain that started about 30 minutes ago, denies any problems with urination, admits to nausea.

## 2020-06-30 ENCOUNTER — Ambulatory Visit
Admission: EM | Admit: 2020-06-30 | Discharge: 2020-06-30 | Disposition: A | Discharge status: Home / Self Care | Payer: BC Managed Care – PPO | Attending: Emergency Medicine | Admitting: Emergency Medicine

## 2020-06-30 ENCOUNTER — Other Ambulatory Visit: Payer: Self-pay

## 2020-06-30 LAB — POCT URINALYSIS DIP (MANUAL ENTRY)
Bilirubin, UA: NEGATIVE
Glucose, UA: NEGATIVE mg/dL
Ketones, POC UA: NEGATIVE mg/dL
Leukocytes, UA: NEGATIVE
Nitrite, UA: NEGATIVE
Protein Ur, POC: NEGATIVE mg/dL
Spec Grav, UA: >=1.03 — AB (ref 1.010–1.025)
Urobilinogen, UA: 1 E.U./dL
pH, UA: 5.5 (ref 5.0–8.0)

## 2020-06-30 NOTE — ED Triage Notes (Signed)
Patient is complaining of fatigue and generalized body aches. She is currently on her period but states this is worse than it usually is. Also states she has a history of UTI and kidney stones.

## 2020-06-30 NOTE — ED Triage Notes (Signed)
Patient requesting narcotics from the provider. Advised that she may not get a prescription for narcotics and that it is up to provider preference. Patient stated she did not want to be seen if we could not guarantee narcotics. Patient left ambulatory without being seen in no acute distress.

## 2020-07-01 ENCOUNTER — Other Ambulatory Visit: Payer: Self-pay

## 2020-07-01 ENCOUNTER — Emergency Department (HOSPITAL_COMMUNITY)
Admission: EM | Admit: 2020-07-01 | Discharge: 2020-07-01 | Disposition: A | Discharge status: Home / Self Care | Payer: BC Managed Care – PPO | Attending: Emergency Medicine | Admitting: Emergency Medicine

## 2020-07-01 ENCOUNTER — Encounter (HOSPITAL_COMMUNITY): Payer: Self-pay | Admitting: Emergency Medicine

## 2020-07-01 DIAGNOSIS — M542 Cervicalgia: Secondary | ICD-10-CM | POA: Insufficient documentation

## 2020-07-01 DIAGNOSIS — W57XXXA Bitten or stung by nonvenomous insect and other nonvenomous arthropods, initial encounter: Secondary | ICD-10-CM | POA: Insufficient documentation

## 2020-07-01 DIAGNOSIS — M2550 Pain in unspecified joint: Secondary | ICD-10-CM | POA: Insufficient documentation

## 2020-07-01 DIAGNOSIS — R519 Headache, unspecified: Secondary | ICD-10-CM | POA: Diagnosis not present

## 2020-07-01 DIAGNOSIS — Z20822 Contact with and (suspected) exposure to covid-19: Secondary | ICD-10-CM | POA: Insufficient documentation

## 2020-07-01 LAB — RAPID URINE DRUG SCREEN, HOSP PERFORMED
Amphetamines: POSITIVE — AB
Barbiturates: NOT DETECTED
Benzodiazepines: NOT DETECTED
Cocaine: NOT DETECTED
Opiates: NOT DETECTED
Tetrahydrocannabinol: NOT DETECTED

## 2020-07-01 MED ORDER — KETOROLAC TROMETHAMINE 30 MG/ML IJ SOLN
30.0000 mg | Freq: Once | INTRAMUSCULAR | Status: AC
  Administered 2020-07-01: 30 mg via INTRAMUSCULAR
  Filled 2020-07-01: qty 1

## 2020-07-01 MED ORDER — DOXYCYCLINE HYCLATE 100 MG PO CAPS
100.0000 mg | ORAL_CAPSULE | Freq: Two times a day (BID) | ORAL | 0 refills | Status: DC
Start: 2020-07-01 — End: 2022-03-19

## 2020-07-01 MED ORDER — HYDROMORPHONE HCL 1 MG/ML IJ SOLN
1.0000 mg | Freq: Once | INTRAMUSCULAR | Status: AC
  Administered 2020-07-01: 1 mg via INTRAMUSCULAR
  Filled 2020-07-01: qty 1

## 2020-07-01 MED ORDER — ONDANSETRON 4 MG PO TBDP
4.0000 mg | ORAL_TABLET | Freq: Once | ORAL | Status: AC
  Administered 2020-07-01: 4 mg via ORAL
  Filled 2020-07-01: qty 1

## 2020-07-01 NOTE — Discharge Instructions (Addendum)
Given your symptoms and your multiple tick exposures you are being treated with docxycycline which is the antibiotic that treats tick infections (covers [place] spotted fever and Lymes). However, your cultures are still pending.  If your Lyme is positive, you will need additional antibiotic (for a full 28 days).  Get rechecked by your primary MD you can further treat your symptoms and will be able to further assess your symptoms once your tests have resulted.

## 2020-07-01 NOTE — ED Triage Notes (Signed)
Patient c/o joint pain throughout body with generalized fatigue, intermittent headache, neck pain/stiffness. Denies any nausea, vomiting, diarrhea, or fevers. Per patient she has removed some tickes, last one 3 weeks ago. Per patient she was just released from [place] and said she was overlooked and "they passed it off on COvid." Patient was tested for Covid but left prior to results.

## 2020-07-01 NOTE — ED Provider Notes (Addendum)
[facility] EMERGENCY DEPARTMENT Provider Note   CSN: [id number] Arrival date & time: 07/01/20  1709     History Chief Complaint  Patient presents with  . Joint Pain    Angelica Crawford is a 34 y.o. female with a history of chronic low back pain and history of kidney stones, presenting for evaluation of generalized body aches, stating all of her joints of her body have severe pain, started 3 days ago.  She also reports generalized fatigue, occasional headache but not currently and neck pain and stiffness.  She has had multiple tick bites over the course of the summer, stating she spends a lot of time outdoors.  She is concerned about possible Lyme disease.  She has had no focal joint pain, redness or swelling, denies any injuries or falls, no wounds.  She has also had no nausea, vomiting, diarrhea, no fevers no abdominal pain.  Also no URI symptoms such as cough, nasal congestion, denies sore throat.  She had some Percocet left over from her last kidney stone, stating she took his many as #3  5 mg tablets she did not touch her pain symptoms.  She states she stepped on an ant nest several days ago and sustained multiple bites on her lower legs and ankles.   HPI     Past Medical History:  Diagnosis Date  . Back pain   . Kidney stones     There are no problems to display for this patient.   History reviewed. No pertinent surgical history.   OB History    Gravida  0   Para  0   Term  0   Preterm  0   AB  0   Living  0     SAB  0   TAB  0   Ectopic  0   Multiple  0   Live Births  0           Family History  Problem Relation Age of Onset  . Multiple sclerosis Mother   . Cancer Mother   . Breast cancer Mother     Social History   Tobacco Use  . Smoking status: Never Smoker  . Smokeless tobacco: Never Used  Vaping Use  . Vaping Use: Never used  Substance Use Topics  . Alcohol use: Yes    Comment: occas  . Drug use: No    Home  Medications Prior to Admission medications   Medication Sig Start Date End Date Taking? Authorizing Provider  buPROPion (WELLBUTRIN XL) 150 MG 24 hr tablet Take 150 mg by mouth every morning. 06/12/20  Yes [provider]  EPINEPHrine 0.3 mg/0.3 mL IJ SOAJ injection Inject 0.3 mg into the muscle as needed for anaphylaxis.  06/12/20  Yes [provider]  FLUoxetine (PROZAC) 40 MG capsule Take 80 mg by mouth daily.  08/15/18  Yes [provider]  LORazepam (ATIVAN) 0.5 MG tablet Take 0.5 mg by mouth daily as needed for anxiety.  10/21/19  Yes [provider]  phentermine 37.5 MG capsule Take 37.5 mg by mouth every morning.   Yes [provider]  topiramate (TOPAMAX) 25 MG tablet Take 75 mg by mouth in the morning. 06/12/20  Yes [provider]  SUMAtriptan (IMITREX) 100 MG tablet Take 100 mg by mouth every 2 (two) hours as needed for migraine.  08/25/19 11/29/19  [provider]  doxycycline (VIBRAMYCIN) 100 MG capsule Take 1 capsule (100 mg total) by mouth 2 (two)  times daily. 07/01/20   Burgess Amor, PA-C  oxyCODONE-acetaminophen (PERCOCET) 5-325 MG tablet Take 1-2 tablets by mouth every 6 (six) hours as needed. Patient not taking: Reported on 07/01/2020 11/29/19   Geoffery Lyons, MD    Allergies    Penicillins, Sulfa antibiotics, and Gabapentin  Review of Systems   Review of Systems  Constitutional: Negative for chills and fever.  HENT: Negative.  Negative for sore throat.   Eyes: Negative for photophobia and visual disturbance.  Respiratory: Negative.  Negative for cough and shortness of breath.   Cardiovascular: Negative.  Negative for chest pain.  Gastrointestinal: Negative for diarrhea, nausea and vomiting.  Musculoskeletal: Positive for arthralgias, joint swelling and neck pain. Negative for myalgias.  Skin: Positive for wound. Negative for rash.  Neurological: Positive for headaches. Negative for weakness, light-headedness and  numbness.    Physical Exam Updated Vital Signs BP (!) 141/103 (BP Location: Right Arm)   Pulse 86   Temp 98.3 F (36.8 C) (Oral)   Resp 20   Ht 5\' 5"  (1.651 m)   Wt 132 kg   LMP 07/01/2020   SpO2 100%   BMI 48.42 kg/m   Physical Exam Vitals and nursing note reviewed.  Constitutional:      Appearance: She is well-developed.  HENT:     Head: Normocephalic and atraumatic.     Nose: Nose normal.     Mouth/Throat:     Mouth: Mucous membranes are moist.     Pharynx: No oropharyngeal exudate or posterior oropharyngeal erythema.  Eyes:     Extraocular Movements: Extraocular movements intact.     Conjunctiva/sclera: Conjunctivae normal.     Comments: No photophobia  Cardiovascular:     Rate and Rhythm: Normal rate and regular rhythm.     Heart sounds: Normal heart sounds.  Pulmonary:     Effort: Pulmonary effort is normal.     Breath sounds: Normal breath sounds. No wheezing.  Abdominal:     General: Bowel sounds are normal.     Palpations: Abdomen is soft.     Tenderness: There is no abdominal tenderness. There is no guarding.  Musculoskeletal:        General: No swelling or deformity. Normal range of motion.     Cervical back: Normal range of motion. No swelling, erythema or rigidity. No pain with movement.     Thoracic back: Normal.     Lumbar back: Normal.     Comments: No joint edema or erythema of large joints appreciated.  c spine, thoracic and l spine without point tenderness.  Patient has full row to motion of C-spine, no meningeal signs.  Skin:    General: Skin is warm and dry.     Findings: Lesion present.     Comments: Multiple small papules, mostly on lower extremities which appear to be insect bites.  No abscess, no signs of cellulitis.  Neurological:     Mental Status: She is alert.     ED Results / Procedures / Treatments   Labs (all labs ordered are listed, but only abnormal results are displayed) Labs Reviewed  SARS CORONAVIRUS 2 (TAT 6-24 HRS)   ROCKY MTN SPOTTED FVR ABS PNL(IGG+IGM)  B. BURGDORFI ANTIBODIES  POC URINE PREG, ED    EKG None  Radiology No results found.  Procedures Procedures (including critical care time)  Medications Ordered in ED Medications  HYDROmorphone (DILAUDID) injection 1 mg (1 mg Intramuscular Given 07/01/20 1831)  ketorolac (TORADOL) 30 MG/ML injection 30 mg (30  mg Intramuscular Given 07/01/20 1831)  ondansetron (ZOFRAN-ODT) disintegrating tablet 4 mg (4 mg Oral Given 07/01/20 1833)    ED Course  I have reviewed the triage vital signs and the nursing notes.  Pertinent labs & imaging results that were available during my care of the patient were reviewed by me and considered in my medical decision making (see chart for details).    MDM Rules/Calculators/A&P                          Patient with generalized arthralgias without any localizing joint pain or edema.  She is afebrile here.  She has no nuchal rigidity despite complaints of having neck stiffness.  Her exam is reassuring, her history is concerning as she describes multiple tick bites over the past several months.  Given these exposure she will be covered for tick infection.  I recommend spotted fever and Lyme antibody titers have been collected.  She will be started on doxycycline, she was given a 10-day course.  However if her Lyme titer is positive that will need to be extended for an additional 18 days.  She was advised to follow-up with her PCP for recheck this next week.  A Covid swab was also collected, she denies any exposures to Covid.  Angelica Crawford was evaluated in Emergency Department on 07/01/2020 for the symptoms described in the history of present illness. She was evaluated in the context of the global COVID-19 pandemic, which necessitated consideration that the patient might be at risk for infection with the SARS-CoV-2 virus that causes COVID-19. Institutional protocols and algorithms that pertain to the evaluation of patients  at risk for COVID-19 are in a state of rapid change based on information released by regulatory bodies including the CDC and federal and state organizations. These policies and algorithms were followed during the patient's care in the ED.  Final Clinical Impression(s) / ED Diagnoses Final diagnoses:  Arthralgia, unspecified joint  Tick bite, initial encounter    Rx / DC Orders ED Discharge Orders         Ordered    doxycycline (VIBRAMYCIN) 100 MG capsule  2 times daily     Discontinue  Reprint     07/01/20 1908           Victoriano Lain 07/01/20 1919   Of note, pt was seen at [facility] today prior to arriving here. She left before completion of her work up. She states her symptoms were not being treated seriously.  However,  A negative urine preg was resulted there, per chart.  Not repeated here. It appears RMSF and Lyme were ordered, ? Collected.  Ordered here.    Burgess Amor, PA-C 07/01/20 1927    Mancel Bale, MD 07/03/20 1143

## 2020-07-01 NOTE — ED Notes (Signed)
Pt left without D/C papers and did not sign topaz signature or get repeat vital signs.

## 2020-07-02 LAB — SARS CORONAVIRUS 2 (TAT 6-24 HRS): SARS Coronavirus 2: NEGATIVE

## 2020-07-03 LAB — B. BURGDORFI ANTIBODIES: B burgdorferi Ab IgG+IgM: <0.91 {ISR} (ref 0.00–0.90)

## 2020-07-05 LAB — ROCKY MTN SPOTTED FVR ABS PNL(IGG+IGM)
RMSF IgG: POSITIVE — AB
RMSF IgM: 1.48 index — ABNORMAL HIGH (ref 0.00–0.89)

## 2020-07-05 LAB — RMSF, IGG, IFA: RMSF, IGG, IFA: <1:64 {titer}

## 2020-07-18 ENCOUNTER — Ambulatory Visit
Admission: EM | Admit: 2020-07-18 | Discharge: 2020-07-18 | Discharge status: Absent without leave | Payer: BC Managed Care – PPO

## 2020-07-18 ENCOUNTER — Other Ambulatory Visit: Payer: Self-pay

## 2020-07-20 ENCOUNTER — Other Ambulatory Visit (HOSPITAL_COMMUNITY): Payer: Self-pay | Admitting: Oncology

## 2020-07-20 DIAGNOSIS — U071 COVID-19: Secondary | ICD-10-CM

## 2020-07-20 MED ORDER — SODIUM CHLORIDE 0.9 % IV SOLN
Freq: Once | INTRAVENOUS | Status: AC
  Filled 2020-07-20: qty 600

## 2020-07-20 NOTE — Progress Notes (Signed)
I connected by phone with  Angelica Crawford on 07/20/2020 at 02:00pm now to discuss the potential use of an new treatment for mild to moderate COVID-19 viral infection in non-hospitalized patients.   This patient is a age/sex that meets the FDA criteria for Emergency Use Authorization of casirivimab\imdevimab.  Has a (+) direct SARS-CoV-2 viral test result 1. Has mild or moderate COVID-19  2. Is ? 36 years of age and weighs ? 40 kg 3. Is NOT hospitalized due to COVID-19 4. Is NOT requiring oxygen therapy or requiring an increase in baseline oxygen flow rate due to COVID-19 5. Is within 10 days of symptom onset 6. Has at least one of the high risk factor(s) for progression to severe COVID-19 and/or hospitalization as defined in EUA. ? Specific high risk criteria :Obesity   Symptom onset 07/17/2020.   I have spoken and communicated the following to the patient or parent/caregiver:   1. FDA has authorized the emergency use of casirivimab\imdevimab for the treatment of mild to moderate COVID-19 in adults and pediatric patients with positive results of direct SARS-CoV-2 viral testing who are 59 years of age and older weighing at least 40 kg, and who are at high risk for progressing to severe COVID-19 and/or hospitalization.   2. The significant known and potential risks and benefits of casirivimab\imdevimab, and the extent to which such potential risks and benefits are unknown.   3. Information on available alternative treatments and the risks and benefits of those alternatives, including clinical trials.   4. Patients treated with casirivimab\imdevimab should continue to self-isolate and use infection control measures (e.g., wear mask, isolate, social distance, avoid sharing personal items, clean and disinfect "high touch" surfaces, and frequent handwashing) according to CDC guidelines.    5. The patient or parent/caregiver has the option to accept or refuse casirivimab\imdevimab .   After reviewing  this information with the patient, The patient agreed to proceed with receiving casirivimab\imdevimab infusion and will be provided a copy of the Fact sheet prior to receiving the infusion.Mignon Pine, AGNP-C [phone] (Infusion Center Hotline)

## 2020-07-21 ENCOUNTER — Ambulatory Visit (HOSPITAL_COMMUNITY)
Admission: RE | Admit: 2020-07-21 | Discharge: 2020-07-21 | Disposition: A | Discharge status: Home / Self Care | Payer: BC Managed Care – PPO | Source: Ambulatory Visit | Attending: Pulmonary Disease | Admitting: Pulmonary Disease

## 2020-07-21 DIAGNOSIS — U071 COVID-19: Secondary | ICD-10-CM | POA: Insufficient documentation

## 2020-07-21 MED ORDER — ALBUTEROL SULFATE HFA 108 (90 BASE) MCG/ACT IN AERS: 2.0000 | INHALATION_SPRAY | Freq: Once | RESPIRATORY_TRACT | Status: DC | PRN

## 2020-07-21 MED ORDER — SODIUM CHLORIDE 0.9 % IV SOLN: INTRAVENOUS | Status: DC | PRN

## 2020-07-21 MED ORDER — METHYLPREDNISOLONE SODIUM SUCC 125 MG IJ SOLR: 125.0000 mg | Freq: Once | INTRAMUSCULAR | Status: DC | PRN

## 2020-07-21 MED ORDER — EPINEPHRINE 0.3 MG/0.3ML IJ SOAJ: 0.3000 mg | Freq: Once | INTRAMUSCULAR | Status: DC | PRN

## 2020-07-21 MED ORDER — FAMOTIDINE IN NACL 20-0.9 MG/50ML-% IV SOLN: 20.0000 mg | Freq: Once | INTRAVENOUS | Status: DC | PRN

## 2020-07-21 MED ORDER — DIPHENHYDRAMINE HCL 50 MG/ML IJ SOLN: 50.0000 mg | Freq: Once | INTRAMUSCULAR | Status: DC | PRN

## 2020-07-21 NOTE — Progress Notes (Signed)
  Diagnosis: COVID-19  Physician: Dr. Shan Levans  Procedure: Covid Infusion Clinic Med: casirivimab\imdevimab infusion - Provided patient with casirivimab\imdevimab fact sheet for patients, parents and caregivers prior to infusion.  Complications: No immediate complications noted.  Discharge: Discharged home   Essie Hart 07/21/2020

## 2020-07-21 NOTE — Discharge Instructions (Signed)

## 2020-12-13 ENCOUNTER — Encounter (HOSPITAL_COMMUNITY): Payer: Self-pay | Admitting: *Deleted

## 2020-12-13 ENCOUNTER — Emergency Department (HOSPITAL_COMMUNITY)
Admission: EM | Admit: 2020-12-13 | Discharge: 2020-12-13 | Disposition: A | Discharge status: Home / Self Care | Payer: BC Managed Care – PPO | Attending: Emergency Medicine | Admitting: Emergency Medicine

## 2020-12-13 ENCOUNTER — Other Ambulatory Visit: Payer: Self-pay

## 2020-12-13 DIAGNOSIS — Z87442 Personal history of urinary calculi: Secondary | ICD-10-CM | POA: Diagnosis not present

## 2020-12-13 DIAGNOSIS — R110 Nausea: Secondary | ICD-10-CM | POA: Diagnosis not present

## 2020-12-13 DIAGNOSIS — R109 Unspecified abdominal pain: Secondary | ICD-10-CM | POA: Insufficient documentation

## 2020-12-13 DIAGNOSIS — Z5321 Procedure and treatment not carried out due to patient leaving prior to being seen by health care provider: Secondary | ICD-10-CM | POA: Insufficient documentation

## 2020-12-13 NOTE — ED Triage Notes (Signed)
Pt with left flank pain for x 8 hours with nausea.  Pt states hx of kidney stones.  Denies blood in urine.

## 2021-08-31 ENCOUNTER — Other Ambulatory Visit: Payer: Self-pay

## 2021-08-31 ENCOUNTER — Emergency Department (HOSPITAL_COMMUNITY)
Admission: EM | Admit: 2021-08-31 | Discharge: 2021-08-31 | Disposition: A | Discharge status: Home / Self Care | Payer: BC Managed Care – PPO | Attending: Emergency Medicine | Admitting: Emergency Medicine

## 2021-08-31 ENCOUNTER — Emergency Department (HOSPITAL_COMMUNITY): Payer: BC Managed Care – PPO

## 2021-08-31 ENCOUNTER — Encounter (HOSPITAL_COMMUNITY): Payer: Self-pay

## 2021-08-31 DIAGNOSIS — R110 Nausea: Secondary | ICD-10-CM | POA: Insufficient documentation

## 2021-08-31 DIAGNOSIS — R109 Unspecified abdominal pain: Secondary | ICD-10-CM | POA: Diagnosis not present

## 2021-08-31 DIAGNOSIS — R350 Frequency of micturition: Secondary | ICD-10-CM | POA: Insufficient documentation

## 2021-08-31 HISTORY — DX: Unspecified convulsions: R56.9

## 2021-08-31 LAB — BASIC METABOLIC PANEL
Anion gap: 5 (ref 5–15)
BUN: 20 mg/dL (ref 6–20)
CO2: 22 mmol/L (ref 22–32)
Calcium: 8.6 mg/dL — ABNORMAL LOW (ref 8.9–10.3)
Chloride: 109 mmol/L (ref 98–111)
Creatinine, Ser: 0.94 mg/dL (ref 0.44–1.00)
GFR, Estimated: >60 mL/min (ref >60)
Glucose, Bld: 91 mg/dL (ref 70–99)
Potassium: 3.7 mmol/L (ref 3.5–5.1)
Sodium: 136 mmol/L (ref 135–145)

## 2021-08-31 LAB — CBC WITH DIFFERENTIAL/PLATELET
Abs Immature Granulocytes: 0.03 10*3/uL (ref 0.00–0.07)
Basophils Absolute: 0.1 10*3/uL (ref 0.0–0.1)
Basophils Relative: 1 %
Eosinophils Absolute: 0.1 10*3/uL (ref 0.0–0.5)
Eosinophils Relative: 2 %
HCT: 42.7 % (ref 36.0–46.0)
Hemoglobin: 13.6 g/dL (ref 12.0–15.0)
Immature Granulocytes: 0 %
Lymphocytes Relative: 30 %
Lymphs Abs: 2.3 10*3/uL (ref 0.7–4.0)
MCH: 28.8 pg (ref 26.0–34.0)
MCHC: 31.9 g/dL (ref 30.0–36.0)
MCV: 90.3 fL (ref 80.0–100.0)
Monocytes Absolute: 0.7 10*3/uL (ref 0.1–1.0)
Monocytes Relative: 9 %
Neutro Abs: 4.6 10*3/uL (ref 1.7–7.7)
Neutrophils Relative %: 58 %
Platelets: 290 10*3/uL (ref 150–400)
RBC: 4.73 MIL/uL (ref 3.87–5.11)
RDW: 14.5 % (ref 11.5–15.5)
WBC: 7.9 10*3/uL (ref 4.0–10.5)
nRBC: 0 % (ref 0.0–0.2)

## 2021-08-31 LAB — POC URINE PREG, ED: Preg Test, Ur: NEGATIVE

## 2021-08-31 LAB — URINALYSIS, ROUTINE W REFLEX MICROSCOPIC
Bilirubin Urine: NEGATIVE
Glucose, UA: NEGATIVE mg/dL
Hgb urine dipstick: NEGATIVE
Ketones, ur: NEGATIVE mg/dL
Leukocytes,Ua: NEGATIVE
Nitrite: NEGATIVE
Protein, ur: NEGATIVE mg/dL
Specific Gravity, Urine: 1.012 (ref 1.005–1.030)
pH: 6 (ref 5.0–8.0)

## 2021-08-31 MED ORDER — ONDANSETRON HCL 4 MG/2ML IJ SOLN
4.0000 mg | Freq: Once | INTRAMUSCULAR | Status: AC
  Administered 2021-08-31: 4 mg via INTRAVENOUS
  Filled 2021-08-31: qty 2

## 2021-08-31 MED ORDER — HYDROMORPHONE HCL 1 MG/ML IJ SOLN
1.0000 mg | Freq: Once | INTRAMUSCULAR | Status: AC
  Administered 2021-08-31: 1 mg via INTRAVENOUS
  Filled 2021-08-31: qty 1

## 2021-08-31 NOTE — ED Triage Notes (Signed)
Pt arrived via POV c/o left flank pain X 3 days and repots polyuria with sharp stabbing pain. Pt reports Hx of renal calculi.

## 2021-08-31 NOTE — ED Provider Notes (Signed)
[facility] EMERGENCY DEPARTMENT Provider Note   CSN: [id number] Arrival date & time: 08/31/21  1859     History Chief Complaint  Patient presents with   Flank Pain    Angelica Crawford is a 35 y.o. female.  HPI  Patient with significant medical history of kidney stones presents  with chief complaint of left-sided flank pain.  Patient states pain started approximate 3 days ago, states the pain waxes and wanes in intensity, will come and go, does not radiate, associated with urinary frequency and nausea, denies dysuria, hematuria, vaginal discharge or vaginal bleeding.  She has no stomach pain, no vomiting, constipation, diarrhea.  Patient denies systemic infection fevers or chills, denies nasal congestion, sore throat, cough, denies general body aches.  She has no significant abdominal history.  She does not endorse fevers, chills, chest pain, shortness of breath, worsening pedal edema.  Past Medical History:  Diagnosis Date   Back pain    Kidney stones    Seizures (HCC)     There are no problems to display for this patient.   History reviewed. No pertinent surgical history.   OB History     Gravida  0   Para  0   Term  0   Preterm  0   AB  0   Living  0      SAB  0   IAB  0   Ectopic  0   Multiple  0   Live Births  0           Family History  Problem Relation Age of Onset   Multiple sclerosis Mother    Cancer Mother    Breast cancer Mother     Social History   Tobacco Use   Smoking status: Never   Smokeless tobacco: Never  Vaping Use   Vaping Use: Never used  Substance Use Topics   Alcohol use: Yes    Comment: occas   Drug use: No    Home Medications Prior to Admission medications   Medication Sig Start Date End Date Taking? Authorizing Provider  buPROPion (WELLBUTRIN XL) 150 MG 24 hr tablet Take 150 mg by mouth every morning. 06/12/20  Yes [provider]  FLUoxetine (PROZAC) 40 MG capsule Take 80 mg by mouth daily.   08/15/18  Yes [provider]  topiramate (TOPAMAX) 25 MG tablet Take 100 mg by mouth in the morning. 06/12/20  Yes [provider]  SUMAtriptan (IMITREX) 100 MG tablet Take 100 mg by mouth every 2 (two) hours as needed for migraine.  08/25/19 11/29/19  [provider]  doxycycline (VIBRAMYCIN) 100 MG capsule Take 1 capsule (100 mg total) by mouth 2 (two) times daily. 07/01/20   Burgess Amor, PA-C  EPINEPHrine 0.3 mg/0.3 mL IJ SOAJ injection Inject 0.3 mg into the muscle as needed for anaphylaxis.  06/12/20   [provider]  LORazepam (ATIVAN) 0.5 MG tablet Take 0.5 mg by mouth daily as needed for anxiety.  10/21/19   [provider]  oxyCODONE-acetaminophen (PERCOCET) 5-325 MG tablet Take 1-2 tablets by mouth every 6 (six) hours as needed. Patient not taking: No sig reported 11/29/19   Geoffery Lyons, MD  phentermine 37.5 MG capsule Take 37.5 mg by mouth every morning.    [provider]  zolpidem (AMBIEN) 10 MG tablet Take 10 mg by mouth at bedtime. 07/26/21   [provider]    Allergies    Penicillins, Sulfa antibiotics, and Gabapentin  Review  of Systems   Review of Systems  Constitutional:  Negative for chills and fever.  HENT:  Negative for congestion.   Respiratory:  Negative for shortness of breath.   Cardiovascular:  Negative for chest pain.  Gastrointestinal:  Positive for nausea. Negative for abdominal pain and vomiting.  Genitourinary:  Positive for flank pain and frequency. Negative for dysuria, enuresis, vaginal bleeding and vaginal discharge.  Musculoskeletal:  Negative for back pain.  Skin:  Negative for rash.  Neurological:  Negative for dizziness.  Hematological:  Does not bruise/bleed easily.   Physical Exam Updated Vital Signs BP 116/88   Pulse 87   Temp 98.3 F (36.8 C) (Oral)   Resp 19   Ht 5\' 5"  (1.651 m)   Wt 126 kg   LMP 08/15/2021 (Exact Date)   SpO2 98%   BMI 46.22 kg/m   Physical Exam Vitals  and nursing note reviewed.  Constitutional:      General: She is in acute distress.     Appearance: She is not ill-appearing.     Comments: Patient appears to be in acute distress, found in the supine position complaining of left-sided flank pain.  HENT:     Head: Normocephalic and atraumatic.     Nose: No congestion.  Eyes:     Conjunctiva/sclera: Conjunctivae normal.  Cardiovascular:     Rate and Rhythm: Normal rate and regular rhythm.     Pulses: Normal pulses.     Heart sounds: No murmur heard.   No friction rub. No gallop.  Pulmonary:     Effort: No respiratory distress.     Breath sounds: No wheezing, rhonchi or rales.  Abdominal:     Palpations: Abdomen is soft.     Tenderness: There is no abdominal tenderness. There is left CVA tenderness. There is no right CVA tenderness.     Comments: Abdomen nondistended, normal bowel sounds, dull to percussion, nontender to palpation.  No guarding, rebound tenderness, peritoneal sign.  She did have slight left-sided flank tenderness.  Musculoskeletal:     Right lower leg: No edema.     Left lower leg: No edema.  Skin:    General: Skin is warm and dry.  Neurological:     Mental Status: She is alert.  Psychiatric:        Mood and Affect: Mood normal.    ED Results / Procedures / Treatments   Labs (all labs ordered are listed, but only abnormal results are displayed) Labs Reviewed  BASIC METABOLIC PANEL - Abnormal; Notable for the following components:      Result Value   Calcium 8.6 (*)    All other components within normal limits  URINALYSIS, ROUTINE W REFLEX MICROSCOPIC  CBC WITH DIFFERENTIAL/PLATELET  POC URINE PREG, ED    EKG None  Radiology CT Renal Stone Study  Result Date: 08/31/2021 CLINICAL DATA:  Left flank pain for 3 days with hematuria. History of stones. EXAM: CT ABDOMEN AND PELVIS WITHOUT CONTRAST TECHNIQUE: Multidetector CT imaging of the abdomen and pelvis was performed following the standard protocol  without IV contrast. COMPARISON:  05/24/2020 FINDINGS: Lower chest: Lung bases are clear. Hepatobiliary: No focal liver abnormality is seen. No gallstones, gallbladder wall thickening, or biliary dilatation. Pancreas: Unremarkable. No pancreatic ductal dilatation or surrounding inflammatory changes. Spleen: Normal in size without focal abnormality. Adrenals/Urinary Tract: No adrenal gland nodules. Three stones in the right kidney. Largest is in the mid pole, measuring 4 mm diameter. No hydronephrosis or hydroureter. Bladder is unremarkable.  Stomach/Bowel: Stomach, small bowel, and colon are not abnormally distended. No wall thickening or inflammatory changes are appreciated. Appendix is normal. Vascular/Lymphatic: No significant vascular findings are present. No enlarged abdominal or pelvic lymph nodes. Reproductive: Uterus and bilateral adnexa are unremarkable. Other: Moderate periumbilical hernia containing fat. No bowel herniation. No free air or free fluid in the abdomen. Musculoskeletal: No acute or significant osseous findings. IMPRESSION: 1. Nonobstructing intrarenal stones in the right kidney. No ureteral stones or obstruction. Left kidney and left ureter are unremarkable. 2. Moderate periumbilical hernia containing fat. No bowel herniation or obstruction. Electronically Signed   By: Burman Nieves M.D.   On: 08/31/2021 23:00    Procedures Procedures   Medications Ordered in ED Medications  HYDROmorphone (DILAUDID) injection 1 mg (1 mg Intravenous Given 08/31/21 2202)  ondansetron (ZOFRAN) injection 4 mg (4 mg Intravenous Given 08/31/21 2201)    ED Course  I have reviewed the triage vital signs and the nursing notes.  Pertinent labs & imaging results that were available during my care of the patient were reviewed by me and considered in my medical decision making (see chart for details).    MDM Rules/Calculators/A&P                          Initial impression-patient presents with  left-sided flank pain.  Being acute distress, vital signs reassuring.  Suspect possible kidney stone versus UTI versus Pilo will obtain basic lab work-up, add on CT renal provide patient with Dilaudid and reassess.  Work-up-CBC unremarkable, BMP shows slight decrease in calcium of 8.6, urine pregnancy negative, UA negative for infection or hematuria, CT renal shows nonobstructing intrarenal stone in the right kidney, moderate periumbilical hernia containing fat no bowel herniation or obstruction  Reassessment-patient was assessed after Dilaudid states she feels much better, has no complaints at this time.  Updated on lab work and imaging patient agreed for discharge.  Rule out-low suspicion for systemic infection as patient is nontoxic-appearing, vital signs reassuring, no leukocytosis seen on CBC.  Low suspicion for Pilo, UTI, kidney stone UA negative for infection or hematuria, CT renal is negative for acute findings.  I have low suspicion for complicated diverticulitis and or bowel obstruction as CT imaging is negative for acute findings, no leukocytosis noted.  Low suspicion for URI and/or pneumonia as patient denies URI-like symptoms, lung sounds were clear bilaterally.  low suspicion for AAA and or dissection presentation is atypical of etiology.  Plan-  Left-sided flank pain since resolved-unclear etiology possible patient spontaneously passed stone.  Will recommend over-the-counter pain medications, follow-up with PCP and/or GI for further evaluation.  Vital signs have remained stable, no indication for hospital admission.    Patient given at home care as well strict return precautions.  Patient verbalized that they understood agreed to said plan.  Final Clinical Impression(s) / ED Diagnoses Final diagnoses:  Flank pain    Rx / DC Orders ED Discharge Orders     None        Carroll Sage, PA-C 08/31/21 2337    Vanetta Mulders, MD 09/07/21 [phone]

## 2021-08-31 NOTE — Discharge Instructions (Addendum)
Lab work and imaging are all reassuring recommend over-the-counter pain medications as needed.  Follow-up PCP and/or GI for further evaluation.  Come back to the emergency department if you develop chest pain, shortness of breath, severe abdominal pain, uncontrolled nausea, vomiting, diarrhea.

## 2022-03-10 ENCOUNTER — Encounter (HOSPITAL_COMMUNITY): Payer: Self-pay | Admitting: *Deleted

## 2022-03-10 ENCOUNTER — Emergency Department (HOSPITAL_COMMUNITY): Payer: BC Managed Care – PPO

## 2022-03-10 ENCOUNTER — Other Ambulatory Visit: Payer: Self-pay

## 2022-03-10 ENCOUNTER — Emergency Department (HOSPITAL_COMMUNITY)
Admission: EM | Admit: 2022-03-10 | Discharge: 2022-03-10 | Disposition: A | Discharge status: Home / Self Care | Payer: BC Managed Care – PPO | Attending: Emergency Medicine | Admitting: Emergency Medicine

## 2022-03-10 DIAGNOSIS — N201 Calculus of ureter: Secondary | ICD-10-CM | POA: Diagnosis not present

## 2022-03-10 DIAGNOSIS — N9489 Other specified conditions associated with female genital organs and menstrual cycle: Secondary | ICD-10-CM | POA: Diagnosis not present

## 2022-03-10 DIAGNOSIS — R109 Unspecified abdominal pain: Secondary | ICD-10-CM | POA: Diagnosis present

## 2022-03-10 LAB — HCG, QUANTITATIVE, PREGNANCY: hCG, Beta Chain, Quant, S: 1 m[IU]/mL (ref <5)

## 2022-03-10 LAB — COMPREHENSIVE METABOLIC PANEL
ALT: 32 U/L (ref 0–44)
AST: 23 U/L (ref 15–41)
Albumin: 3.1 g/dL — ABNORMAL LOW (ref 3.5–5.0)
Alkaline Phosphatase: 51 U/L (ref 38–126)
Anion gap: 5 (ref 5–15)
BUN: 15 mg/dL (ref 6–20)
CO2: 21 mmol/L — ABNORMAL LOW (ref 22–32)
Calcium: 8.5 mg/dL — ABNORMAL LOW (ref 8.9–10.3)
Chloride: 112 mmol/L — ABNORMAL HIGH (ref 98–111)
Creatinine, Ser: 0.95 mg/dL (ref 0.44–1.00)
GFR, Estimated: >60 mL/min (ref >60)
Glucose, Bld: 98 mg/dL (ref 70–99)
Potassium: 4.2 mmol/L (ref 3.5–5.1)
Sodium: 138 mmol/L (ref 135–145)
Total Bilirubin: 0.7 mg/dL (ref 0.3–1.2)
Total Protein: 6.6 g/dL (ref 6.5–8.1)

## 2022-03-10 LAB — CBC WITH DIFFERENTIAL/PLATELET
Abs Immature Granulocytes: 0.01 10*3/uL (ref 0.00–0.07)
Basophils Absolute: 0.1 10*3/uL (ref 0.0–0.1)
Basophils Relative: 1 %
Eosinophils Absolute: 0.1 10*3/uL (ref 0.0–0.5)
Eosinophils Relative: 3 %
HCT: 42.1 % (ref 36.0–46.0)
Hemoglobin: 13.1 g/dL (ref 12.0–15.0)
Immature Granulocytes: 0 %
Lymphocytes Relative: 31 %
Lymphs Abs: 1.6 10*3/uL (ref 0.7–4.0)
MCH: 28 pg (ref 26.0–34.0)
MCHC: 31.1 g/dL (ref 30.0–36.0)
MCV: 90 fL (ref 80.0–100.0)
Monocytes Absolute: 0.5 10*3/uL (ref 0.1–1.0)
Monocytes Relative: 9 %
Neutro Abs: 2.9 10*3/uL (ref 1.7–7.7)
Neutrophils Relative %: 56 %
Platelets: 266 10*3/uL (ref 150–400)
RBC: 4.68 MIL/uL (ref 3.87–5.11)
RDW: 13.6 % (ref 11.5–15.5)
WBC: 5.1 10*3/uL (ref 4.0–10.5)
nRBC: 0 % (ref 0.0–0.2)

## 2022-03-10 LAB — URINALYSIS, ROUTINE W REFLEX MICROSCOPIC
Bilirubin Urine: NEGATIVE
Glucose, UA: NEGATIVE mg/dL
Ketones, ur: NEGATIVE mg/dL
Leukocytes,Ua: NEGATIVE
Nitrite: NEGATIVE
Protein, ur: NEGATIVE mg/dL
Specific Gravity, Urine: 1.017 (ref 1.005–1.030)
pH: 7 (ref 5.0–8.0)

## 2022-03-10 MED ORDER — HYDROMORPHONE HCL 1 MG/ML IJ SOLN
1.0000 mg | Freq: Once | INTRAMUSCULAR | Status: AC
  Administered 2022-03-10: 1 mg via INTRAVENOUS
  Filled 2022-03-10: qty 1

## 2022-03-10 MED ORDER — OXYCODONE-ACETAMINOPHEN 5-325 MG PO TABS: 1.0000 | ORAL_TABLET | Freq: Three times a day (TID) | ORAL | 0 refills | Status: AC | PRN

## 2022-03-10 MED ORDER — SODIUM CHLORIDE 0.9 % IV BOLUS
500.0000 mL | Freq: Once | INTRAVENOUS | Status: AC
  Administered 2022-03-10: 500 mL via INTRAVENOUS

## 2022-03-10 MED ORDER — ONDANSETRON HCL 4 MG PO TABS: 4.0000 mg | ORAL_TABLET | Freq: Four times a day (QID) | ORAL | 0 refills | Status: DC

## 2022-03-10 MED ORDER — HYDROMORPHONE HCL 2 MG/ML IJ SOLN
2.0000 mg | Freq: Once | INTRAMUSCULAR | Status: AC
  Administered 2022-03-10: 2 mg via INTRAVENOUS
  Filled 2022-03-10: qty 1

## 2022-03-10 MED ORDER — TAMSULOSIN HCL 0.4 MG PO CAPS: 0.4000 mg | ORAL_CAPSULE | Freq: Every day | ORAL | 0 refills | Status: DC

## 2022-03-10 MED ORDER — KETOROLAC TROMETHAMINE 30 MG/ML IJ SOLN
15.0000 mg | Freq: Once | INTRAMUSCULAR | Status: AC
  Administered 2022-03-10: 15 mg via INTRAVENOUS
  Filled 2022-03-10: qty 1

## 2022-03-10 MED ORDER — ONDANSETRON HCL 4 MG/2ML IJ SOLN
4.0000 mg | Freq: Once | INTRAMUSCULAR | Status: AC
  Administered 2022-03-10: 4 mg via INTRAVENOUS
  Filled 2022-03-10: qty 2

## 2022-03-10 MED ORDER — SODIUM CHLORIDE 0.9 % IV BOLUS
1000.0000 mL | Freq: Once | INTRAVENOUS | Status: AC
  Administered 2022-03-10: 1000 mL via INTRAVENOUS

## 2022-03-10 NOTE — ED Triage Notes (Signed)
Pt c/o right flank pain that radiates around to right groin, cloudy urine and nausea since 0400 this morning.  ?

## 2022-03-10 NOTE — Discharge Instructions (Signed)
You have a 5 mm stone, this may pass on its own, have given you Flomax please take as prescribed if you start to feel lightheaded or dizziness please discontinue this medication.  Please remember to stay hydrated.  Given you pain medication please take as prescribed. ? ?I have given you a short course of narcotics please take as prescribed.  This medication can make you drowsy do not consume alcohol or operate heavy machinery when taking this medication.  This medication is Tylenol in it do not take Tylenol and take this medication.   ? ?Please follow-up with urology ? ?Come back if symptoms worsen i.e. unable to urinate's worsening pain despite pain medication uncontrolled nausea vomiting fevers or chills she will need further eval. ?

## 2022-03-10 NOTE — ED Notes (Signed)
Pt unable to urinate at this time.  

## 2022-03-10 NOTE — ED Notes (Signed)
Pt still unable to urinate 

## 2022-03-10 NOTE — ED Provider Notes (Signed)
?[facility] EMERGENCY DEPARTMENT ?Provider Note ? ? ?CSN: [id number] ?Arrival date & time: 03/10/22  [id number] ? ?  ? ?History ? ?Chief Complaint  ?Patient presents with  ? Flank Pain  ? ? ?Angelica Crawford is a 36 y.o. female. ? ?HPI ? ?Patient with medical history including back pain, seizures, kidney stones presents with complaint of right-sided flank tenderness.  He states pain came on suddenly around 4 AM and woke up out of sleep, pain has remained constant will wax and wane in intensity,  pain in her right flank and radiating to the right groin, states that she cannot find a comfortable position, states she notes that her urine is slightly cloudier and has been urinating more frequently, denies dysuria or hematuria no vaginal discharge no vaginal bleeding, she states that she is still passing gas and having bowel movements, no associated fevers or chills.  States that she has felt nauseated from the pain, she has a history of kidney stones and states this feels similar to that, she has no other sniffing abdominal history.  Denies any alleviating or aggravating factors. ? ?Patient has been seen 6 months ago for similar presentation, she had CT imaging which was negative for acute findings she felt better after the milligram Dilaudid and was later discharged home.  She had a 3 mm stone about 2 years ago seen on CT imaging ? ? ? ?Home Medications ?Prior to Admission medications   ?Medication Sig Start Date End Date Taking? Authorizing Provider  ?ondansetron (ZOFRAN) 4 MG tablet Take 1 tablet (4 mg total) by mouth every 6 (six) hours. 03/10/22  Yes Carroll SageFaulkner, Anwita Mencer J, PA-C  ?oxyCODONE-acetaminophen (PERCOCET/ROXICET) 5-325 MG tablet Take 1 tablet by mouth every 8 (eight) hours as needed for up to 4 days for severe pain. 03/10/22 03/14/22 Yes Carroll SageFaulkner, Jancy Sprankle J, PA-C  ?tamsulosin (FLOMAX) 0.4 MG CAPS capsule Take 1 capsule (0.4 mg total) by mouth daily for 14 days. 03/10/22 03/24/22 Yes Carroll SageFaulkner, Stacy Deshler J, PA-C   ?SUMAtriptan (IMITREX) 100 MG tablet Take 100 mg by mouth every 2 (two) hours as needed for migraine.  08/25/19 11/29/19  [provider]  ?buPROPion (WELLBUTRIN XL) 150 MG 24 hr tablet Take 150 mg by mouth every morning. 06/12/20   [provider]  ?doxycycline (VIBRAMYCIN) 100 MG capsule Take 1 capsule (100 mg total) by mouth 2 (two) times daily. 07/01/20   Burgess AmorIdol, Julie, PA-C  ?EPINEPHrine 0.3 mg/0.3 mL IJ SOAJ injection Inject 0.3 mg into the muscle as needed for anaphylaxis.  06/12/20   [provider]  ?FLUoxetine (PROZAC) 40 MG capsule Take 80 mg by mouth daily.  08/15/18   [provider]  ?LORazepam (ATIVAN) 0.5 MG tablet Take 0.5 mg by mouth daily as needed for anxiety.  10/21/19   [provider]  ?phentermine 37.5 MG capsule Take 37.5 mg by mouth every morning.    [provider]  ?topiramate (TOPAMAX) 25 MG tablet Take 100 mg by mouth in the morning. 06/12/20   [provider]  ?zolpidem (AMBIEN) 10 MG tablet Take 10 mg by mouth at bedtime. 07/26/21   [provider]  ?   ? ?Allergies    ?Penicillins, Sulfa antibiotics, and Gabapentin   ? ?Review of Systems   ?Review of Systems  ?Constitutional:  Negative for chills and fever.  ?Respiratory:  Negative for shortness of breath.   ?Cardiovascular:  Negative for chest pain.  ?Gastrointestinal:  Positive for nausea. Negative for abdominal pain, constipation and vomiting.  ?  Genitourinary:  Positive for flank pain and frequency. Negative for dysuria.  ?Neurological:  Negative for headaches.  ? ?Physical Exam ?Updated Vital Signs ?BP (!) 125/103   Pulse 75   Temp 97.7 ?F (36.5 ?C) (Oral)   Resp 20   Ht 5\' 5"  (1.651 m)   Wt 114.3 kg   LMP  (Within Weeks)   SpO2 100%   BMI 41.93 kg/m?  ?Physical Exam ?Vitals and nursing note reviewed.  ?Constitutional:   ?   General: She is not in acute distress. ?   Appearance: She is not ill-appearing.  ?HENT:  ?   Head: Normocephalic and atraumatic.  ?    Nose: No congestion.  ?Eyes:  ?   Conjunctiva/sclera: Conjunctivae normal.  ?Cardiovascular:  ?   Rate and Rhythm: Normal rate and regular rhythm.  ?   Pulses: Normal pulses.  ?   Heart sounds: No murmur heard. ?  No friction rub. No gallop.  ?Pulmonary:  ?   Effort: No respiratory distress.  ?   Breath sounds: No wheezing, rhonchi or rales.  ?Abdominal:  ?   Palpations: Abdomen is soft.  ?   Tenderness: There is abdominal tenderness. There is right CVA tenderness. There is no left CVA tenderness.  ?   Comments: Abdomen nondistended, has noted right-sided flank pain is as well as positive CVA tenderness, there is no guarding, rebound tenderness, peritoneal sign, she had no tenderness in the right lower quadrants, negative Murphy sign McBurney point.  ?Musculoskeletal:  ?   Right lower leg: No edema.  ?   Left lower leg: No edema.  ?Skin: ?   General: Skin is warm and dry.  ?Neurological:  ?   Mental Status: She is alert.  ?Psychiatric:     ?   Mood and Affect: Mood normal.  ? ? ?ED Results / Procedures / Treatments   ?Labs ?(all labs ordered are listed, but only abnormal results are displayed) ?Labs Reviewed  ?COMPREHENSIVE METABOLIC PANEL - Abnormal; Notable for the following components:  ?    Result Value  ? Chloride 112 (*)   ? CO2 21 (*)   ? Calcium 8.5 (*)   ? Albumin 3.1 (*)   ? All other components within normal limits  ?URINALYSIS, ROUTINE W REFLEX MICROSCOPIC - Abnormal; Notable for the following components:  ? APPearance HAZY (*)   ? Hgb urine dipstick MODERATE (*)   ? Bacteria, UA RARE (*)   ? All other components within normal limits  ?CBC WITH DIFFERENTIAL/PLATELET  ?HCG, QUANTITATIVE, PREGNANCY  ? ? ?EKG ?None ? ?Radiology ?CT Renal Stone Study ? ?Result Date: 03/10/2022 ?CLINICAL DATA:  Flank pain EXAM: CT ABDOMEN AND PELVIS WITHOUT CONTRAST TECHNIQUE: Multidetector CT imaging of the abdomen and pelvis was performed following the standard protocol without IV contrast. RADIATION DOSE REDUCTION: This  exam was performed according to the departmental dose-optimization program which includes automated exposure control, adjustment of the mA and/or kV according to patient size and/or use of iterative reconstruction technique. COMPARISON:  CT renal stone 08/31/2021 FINDINGS: Lower chest: No acute abnormality. Hepatobiliary: No focal liver abnormality is seen. No gallstones, gallbladder wall thickening, or biliary dilatation. Pancreas: Unremarkable. No pancreatic ductal dilatation or surrounding inflammatory changes. Spleen: Normal in size without focal abnormality. Adrenals/Urinary Tract: Adrenal glands are normal. A few 1-2 mm calculi identified in the bilateral kidneys. 5 mm calculus in the distal right ureter approximately 5 cm above the bladder. Mild right hydroureteronephrosis and perinephric edema. Urinary bladder appears  normal. Stomach/Bowel: Stomach is within normal limits. Appendix appears normal. No evidence of bowel wall thickening, distention, or inflammatory changes. Vascular/Lymphatic: No significant vascular findings are present. No enlarged abdominal or pelvic lymph nodes. Reproductive: Uterus and bilateral adnexa are unremarkable. Other: No ascites. Musculoskeletal: No acute or significant osseous findings. IMPRESSION: 1. 5 mm obstructing calculus in the distal right ureter. Mild right hydroureteronephrosis. 2. Bilateral small nephrolithiasis. Electronically Signed   By: Jannifer Hick M.D.   On: 03/10/2022 10:53   ? ?Procedures ?Procedures  ? ? ?Medications Ordered in ED ?Medications  ?sodium chloride 0.9 % bolus 1,000 mL (0 mLs Intravenous Stopped 03/10/22 1135)  ?HYDROmorphone (DILAUDID) injection 1 mg (1 mg Intravenous Given 03/10/22 0952)  ?ondansetron [facility]) injection 4 mg (4 mg Intravenous Given 03/10/22 0952)  ?HYDROmorphone (DILAUDID) injection 1 mg (1 mg Intravenous Given 03/10/22 1046)  ?ketorolac (TORADOL) 30 MG/ML injection 15 mg (15 mg Intravenous Given 03/10/22 1141)  ?sodium chloride  0.9 % bolus 500 mL (0 mLs Intravenous Stopped 03/10/22 1246)  ?HYDROmorphone (DILAUDID) injection 2 mg (2 mg Intravenous Given 03/10/22 1239)  ? ? ?ED Course/ Medical Decision Making/ A&P ?  ?                        ?Medical Dec

## 2022-03-11 ENCOUNTER — Encounter: Payer: Self-pay | Admitting: Urology

## 2022-03-11 ENCOUNTER — Ambulatory Visit (HOSPITAL_COMMUNITY)
Admission: RE | Admit: 2022-03-11 | Discharge: 2022-03-11 | Disposition: A | Discharge status: Home / Self Care | Payer: BC Managed Care – PPO | Source: Ambulatory Visit | Attending: Urology | Admitting: Urology

## 2022-03-11 ENCOUNTER — Ambulatory Visit (INDEPENDENT_AMBULATORY_CARE_PROVIDER_SITE_OTHER): Payer: BC Managed Care – PPO | Admitting: Urology

## 2022-03-11 ENCOUNTER — Other Ambulatory Visit: Payer: Self-pay

## 2022-03-11 VITALS — BP 145/84 | HR 96

## 2022-03-11 DIAGNOSIS — N201 Calculus of ureter: Secondary | ICD-10-CM

## 2022-03-11 DIAGNOSIS — N200 Calculus of kidney: Secondary | ICD-10-CM | POA: Diagnosis not present

## 2022-03-11 MED ORDER — HYDROMORPHONE HCL 4 MG PO TABS: 4.0000 mg | ORAL_TABLET | ORAL | 0 refills | Status: DC | PRN

## 2022-03-11 NOTE — H&P (View-Only) (Signed)
? ?03/11/2022 ?4:21 PM  ? ?Angelica Crawford ?10/20/1986 ?[id number] ? ?Referring provider: Miller, Lisa, MD ?[address] ?[place],   [address] ? ?nephrolithiasis ? ? ?HPI: ?[name] is a 35yo here for evaluation of nephrolithiasis. Starting 5 days ago she developed sharp, intermittent, mild to moderate nonradiaitng right flank pain. She presented to the ER yesterday and was diagnosed with a 5mm right ureteral calculus on CT stone study. KUB from today confirms a 5mm right mid ureteral calculus. She has associated nausea but no vomiting. This is her 3rd stone events with the last event was 1 year ago. Currently her pain is 6-7/10. She was discharged from ER with Percocet 5/325mg. No fevers. No other associated symptoms. No LUTS. No hematuria.  ? ? ?PMH: ?Past Medical History:  ?Diagnosis Date  ? Back pain   ? Kidney stones   ? Seizures (HCC)   ? ? ?Surgical History: ?No past surgical history on file. ? ?Home Medications:  ?Allergies as of 03/11/2022   ? ?   Reactions  ? Penicillins Anaphylaxis  ? Sulfa Antibiotics Anaphylaxis  ? Gabapentin Other (See Comments)  ? tremors  ? Salicylic Acid Other (See Comments)  ? ?  ? ?  ?Medication List  ?  ? ?  ? Accurate as of March 11, 2022  4:21 PM. If you have any questions, ask your nurse or doctor.  ?  ?  ? ?  ? ?buPROPion 150 MG 24 hr tablet ?Commonly known as: WELLBUTRIN XL ?Take 150 mg by mouth every morning. ?  ?doxycycline 100 MG capsule ?Commonly known as: VIBRAMYCIN ?Take 1 capsule (100 mg total) by mouth 2 (two) times daily. ?  ?EPINEPHrine 0.3 mg/0.3 mL Soaj injection ?Commonly known as: EPI-PEN ?Inject 0.3 mg into the muscle as needed for anaphylaxis. ?  ?FLUoxetine 40 MG capsule ?Commonly known as: PROZAC ?Take 80 mg by mouth daily. ?  ?LORazepam 0.5 MG tablet ?Commonly known as: ATIVAN ?Take 0.5 mg by mouth daily as needed for anxiety. ?  ?ondansetron 4 MG tablet ?Commonly known as: ZOFRAN ?Take 1 tablet (4 mg total) by mouth every 6 (six) hours. ?   ?oxyCODONE-acetaminophen 5-325 MG tablet ?Commonly known as: PERCOCET/ROXICET ?Take 1 tablet by mouth every 8 (eight) hours as needed for up to 4 days for severe pain. ?  ?phentermine 37.5 MG capsule ?Take 37.5 mg by mouth every morning. ?  ?tamsulosin 0.4 MG Caps capsule ?Commonly known as: FLOMAX ?Take 1 capsule (0.4 mg total) by mouth daily for 14 days. ?  ?topiramate 25 MG tablet ?Commonly known as: TOPAMAX ?Take 100 mg by mouth in the morning. ?  ?topiramate 100 MG tablet ?Commonly known as: TOPAMAX ?Take 100 mg by mouth daily. ?  ?zolpidem 10 MG tablet ?Commonly known as: AMBIEN ?Take 10 mg by mouth at bedtime. ?  ? ?  ? ? ?Allergies:  ?Allergies  ?Allergen Reactions  ? Penicillins Anaphylaxis  ? Sulfa Antibiotics Anaphylaxis  ? Gabapentin Other (See Comments)  ?  tremors  ? Salicylic Acid Other (See Comments)  ? ? ?Family History: ?Family History  ?Problem Relation Age of Onset  ? Multiple sclerosis Mother   ? Cancer Mother   ? Breast cancer Mother   ? ? ?Social History:  reports that she has never smoked. She has never used smokeless tobacco. She reports current alcohol use. She reports that she does not use drugs. ? ?ROS: ?All other review of systems were reviewed and are negative except what is   noted above in HPI ? ?Physical Exam: ?BP (!) 145/84   Pulse 96   ?Constitutional:  Alert and oriented, No acute distress. ?HEENT: [place] AT, moist mucus membranes.  Trachea midline, no masses. ?Cardiovascular: No clubbing, cyanosis, or edema. ?Respiratory: Normal respiratory effort, no increased work of breathing. ?GI: Abdomen is soft, nontender, nondistended, no abdominal masses ?GU: No CVA tenderness.  ?Lymph: No cervical or inguinal lymphadenopathy. ?Skin: No rashes, bruises or suspicious lesions. ?Neurologic: Grossly intact, no focal deficits, moving all 4 extremities. ?Psychiatric: Normal mood and affect. ? ?Laboratory Data: ?Lab Results  ?Component Value Date  ? WBC 5.1 03/10/2022  ? HGB 13.1 03/10/2022  ? HCT  42.1 03/10/2022  ? MCV 90.0 03/10/2022  ? PLT 266 03/10/2022  ? ? ?Lab Results  ?Component Value Date  ? CREATININE 0.95 03/10/2022  ? ? ?No results found for: PSA ? ?No results found for: TESTOSTERONE ? ?No results found for: HGBA1C ? ?Urinalysis ?   ?Component Value Date/Time  ? COLORURINE YELLOW 03/10/2022 1141  ? APPEARANCEUR HAZY (A) 03/10/2022 1141  ? LABSPEC 1.017 03/10/2022 1141  ? PHURINE 7.0 03/10/2022 1141  ? GLUCOSEU NEGATIVE 03/10/2022 1141  ? HGBUR MODERATE (A) 03/10/2022 1141  ? BILIRUBINUR NEGATIVE 03/10/2022 1141  ? BILIRUBINUR negative 06/30/2020 1806  ? KETONESUR NEGATIVE 03/10/2022 1141  ? PROTEINUR NEGATIVE 03/10/2022 1141  ? UROBILINOGEN 1.0 06/30/2020 1806  ? NITRITE NEGATIVE 03/10/2022 1141  ? LEUKOCYTESUR NEGATIVE 03/10/2022 1141  ? ? ?Lab Results  ?Component Value Date  ? BACTERIA RARE (A) 03/10/2022  ? ? ?Pertinent Imaging: ?Ct stone yesterday and KUb today: Images reviewed and discussed with thepatient ?Results for orders placed during the hospital encounter of 12/30/18 ? ?DG Abdomen 1 View ? ?Narrative ?CLINICAL DATA:  Left sided abdominal pain, diaphoresis, and urinary ?retention that started 3 days ago. Stated she has known kidney stone ?2mm since early Dec. Denies surgery. ? ?EXAM: ?ABDOMEN - 1 VIEW ? ?COMPARISON:  12/18/2018.  CT, 12/07/2018. ? ?FINDINGS: ?Normal bowel gas pattern. ? ?No visible renal or ureteral stones. Tiny right renal stone noted on ?the prior CT is not resolved. Tiny stone noted in the mid to distal ?left ureter on the prior CT also not resolved radiographically. ? ?Soft tissues are unremarkable.  No skeletal abnormality. ? ?IMPRESSION: ?1. Negative exam. Small left ureteral stone and small right ?intrarenal stone noted on the prior CT are not visualized, likely ?too small to be resolved. Ureteral stone has possibly passed. ? ? ?Electronically Signed ?By: David  Ormond M.D. ?On: 12/30/2018 20:44 ? ?No results found for this or any previous visit. ? ?No results  found for this or any previous visit. ? ?No results found for this or any previous visit. ? ?Results for orders placed during the hospital encounter of 01/10/19 ? ?US Renal ? ?Narrative ?CLINICAL DATA:  Left flank pain.  Left UVJ stone. ? ?EXAM: ?RENAL / URINARY TRACT ULTRASOUND COMPLETE ? ?COMPARISON:  01/05/2019 ? ?FINDINGS: ?Right Kidney: ? ?Renal measurements: 11.1 x 4.9 by 4.8 cm = volume: 135.7 ML . ?Echogenicity within normal limits. No mass or hydronephrosis ?visualized. ? ?Left Kidney: ? ?Renal measurements: 10.7 x 5.5 x 5.2 cm. = volume: 161.2 mL. ?Echogenicity within normal limits. No mass or hydronephrosis ?visualized. ? ?Bladder: ? ?Appears normal for degree of bladder distention. ? ?IMPRESSION: ?1. Normal renal sonogram.  No hydronephrosis identified. ? ? ?Electronically Signed ?By: Taylor  Stroud M.D. ?On: 01/10/2019 10:36 ? ?No results found for this or any previous visit. ? ?  No results found for this or any previous visit. ? ?Results for orders placed during the hospital encounter of 03/10/22 ? ?CT Renal Stone Study ? ?Narrative ?CLINICAL DATA:  Flank pain ? ?EXAM: ?CT ABDOMEN AND PELVIS WITHOUT CONTRAST ? ?TECHNIQUE: ?Multidetector CT imaging of the abdomen and pelvis was performed ?following the standard protocol without IV contrast. ? ?RADIATION DOSE REDUCTION: This exam was performed according to the ?departmental dose-optimization program which includes automated ?exposure control, adjustment of the mA and/or kV according to ?patient size and/or use of iterative reconstruction technique. ? ?COMPARISON:  CT renal stone 08/31/2021 ? ?FINDINGS: ?Lower chest: No acute abnormality. ? ?Hepatobiliary: No focal liver abnormality is seen. No gallstones, ?gallbladder wall thickening, or biliary dilatation. ? ?Pancreas: Unremarkable. No pancreatic ductal dilatation or ?surrounding inflammatory changes. ? ?Spleen: Normal in size without focal abnormality. ? ?Adrenals/Urinary Tract: Adrenal glands are  normal. A few 1-2 mm ?calculi identified in the bilateral kidneys. 5 mm calculus in the ?distal right ureter approximately 5 cm above the bladder. Mild right ?hydroureteronephrosis and perinephric edema. Urinary blad

## 2022-03-11 NOTE — Progress Notes (Addendum)
03/11/2022 4:21 PM   Angelica Crawford 04-28-1986 [id number]  Referring provider: Sigmund Hazel, MD [address] [place],  [place] [address]  nephrolithiasis   HPI: Ms Angelica Crawford is a 35yo here for evaluation of nephrolithiasis. Starting 5 days ago she developed sharp, intermittent, mild to moderate nonradiaitng right flank pain. She presented to the ER yesterday and was diagnosed with a 5mm right ureteral calculus on CT stone study. KUB from today confirms a 5mm right mid ureteral calculus. She has associated nausea but no vomiting. This is her 3rd stone events with the last event was 1 year ago. Currently her pain is 6-7/10. She was discharged from ER with Percocet 5/325mg . No fevers. No other associated symptoms. No LUTS. No hematuria.    PMH: Past Medical History:  Diagnosis Date   Back pain    Kidney stones    Seizures (HCC)     Surgical History: No past surgical history on file.  Home Medications:  Allergies as of 03/11/2022       Reactions   Penicillins Anaphylaxis   Sulfa Antibiotics Anaphylaxis   Gabapentin Other (See Comments)   tremors   Salicylic Acid Other (See Comments)        Medication List        Accurate as of March 11, 2022  4:21 PM. If you have any questions, ask your nurse or doctor.          buPROPion 150 MG 24 hr tablet Commonly known as: WELLBUTRIN XL Take 150 mg by mouth every morning.   doxycycline 100 MG capsule Commonly known as: VIBRAMYCIN Take 1 capsule (100 mg total) by mouth 2 (two) times daily.   EPINEPHrine 0.3 mg/0.3 mL Soaj injection Commonly known as: EPI-PEN Inject 0.3 mg into the muscle as needed for anaphylaxis.   FLUoxetine 40 MG capsule Commonly known as: PROZAC Take 80 mg by mouth daily.   LORazepam 0.5 MG tablet Commonly known as: ATIVAN Take 0.5 mg by mouth daily as needed for anxiety.   ondansetron 4 MG tablet Commonly known as: ZOFRAN Take 1 tablet (4 mg total) by mouth every 6 (six) hours.    oxyCODONE-acetaminophen 5-325 MG tablet Commonly known as: PERCOCET/ROXICET Take 1 tablet by mouth every 8 (eight) hours as needed for up to 4 days for severe pain.   phentermine 37.5 MG capsule Take 37.5 mg by mouth every morning.   tamsulosin 0.4 MG Caps capsule Commonly known as: FLOMAX Take 1 capsule (0.4 mg total) by mouth daily for 14 days.   topiramate 25 MG tablet Commonly known as: TOPAMAX Take 100 mg by mouth in the morning.   topiramate 100 MG tablet Commonly known as: TOPAMAX Take 100 mg by mouth daily.   zolpidem 10 MG tablet Commonly known as: AMBIEN Take 10 mg by mouth at bedtime.        Allergies:  Allergies  Allergen Reactions   Penicillins Anaphylaxis   Sulfa Antibiotics Anaphylaxis   Gabapentin Other (See Comments)    tremors   Salicylic Acid Other (See Comments)    Family History: Family History  Problem Relation Age of Onset   Multiple sclerosis Mother    Cancer Mother    Breast cancer Mother     Social History:  reports that she has never smoked. She has never used smokeless tobacco. She reports current alcohol use. She reports that she does not use drugs.  ROS: All other review of systems were reviewed and are negative except what is  noted above in HPI  Physical Exam: BP (!) 145/84   Pulse 96   Constitutional:  Alert and oriented, No acute distress. HEENT: [place] AT, moist mucus membranes.  Trachea midline, no masses. Cardiovascular: No clubbing, cyanosis, or edema. Respiratory: Normal respiratory effort, no increased work of breathing. GI: Abdomen is soft, nontender, nondistended, no abdominal masses GU: No CVA tenderness.  Lymph: No cervical or inguinal lymphadenopathy. Skin: No rashes, bruises or suspicious lesions. Neurologic: Grossly intact, no focal deficits, moving all 4 extremities. Psychiatric: Normal mood and affect.  Laboratory Data: Lab Results  Component Value Date   WBC 5.1 03/10/2022   HGB 13.1 03/10/2022   HCT  42.1 03/10/2022   MCV 90.0 03/10/2022   PLT 266 03/10/2022    Lab Results  Component Value Date   CREATININE 0.95 03/10/2022    No results found for: PSA  No results found for: TESTOSTERONE  No results found for: HGBA1C  Urinalysis    Component Value Date/Time   COLORURINE YELLOW 03/10/2022 1141   APPEARANCEUR HAZY (A) 03/10/2022 1141   LABSPEC 1.017 03/10/2022 1141   PHURINE 7.0 03/10/2022 1141   GLUCOSEU NEGATIVE 03/10/2022 1141   HGBUR MODERATE (A) 03/10/2022 1141   BILIRUBINUR NEGATIVE 03/10/2022 1141   BILIRUBINUR negative 06/30/2020 1806   KETONESUR NEGATIVE 03/10/2022 1141   PROTEINUR NEGATIVE 03/10/2022 1141   UROBILINOGEN 1.0 06/30/2020 1806   NITRITE NEGATIVE 03/10/2022 1141   LEUKOCYTESUR NEGATIVE 03/10/2022 1141    Lab Results  Component Value Date   BACTERIA RARE (A) 03/10/2022    Pertinent Imaging: Ct stone yesterday and KUb today: Images reviewed and discussed with thepatient Results for orders placed during the hospital encounter of 12/30/18  DG Abdomen 1 View  Narrative CLINICAL DATA:  Left sided abdominal pain, diaphoresis, and urinary retention that started 3 days ago. Stated she has known kidney stone 2mm since early Dec. Denies surgery.  EXAM: ABDOMEN - 1 VIEW  COMPARISON:  12/18/2018.  CT, 12/07/2018.  FINDINGS: Normal bowel gas pattern.  No visible renal or ureteral stones. Tiny right renal stone noted on the prior CT is not resolved. Tiny stone noted in the mid to distal left ureter on the prior CT also not resolved radiographically.  Soft tissues are unremarkable.  No skeletal abnormality.  IMPRESSION: 1. Negative exam. Small left ureteral stone and small right intrarenal stone noted on the prior CT are not visualized, likely too small to be resolved. Ureteral stone has possibly passed.   Electronically Signed By: Amie Portland M.D. On: 12/30/2018 20:44  No results found for this or any previous visit.  No results  found for this or any previous visit.  No results found for this or any previous visit.  Results for orders placed during the hospital encounter of 01/10/19  US Renal  Narrative CLINICAL DATA:  Left flank pain.  Left UVJ stone.  EXAM: RENAL / URINARY TRACT ULTRASOUND COMPLETE  COMPARISON:  01/05/2019  FINDINGS: Right Kidney:  Renal measurements: 11.1 x 4.9 by 4.8 cm = volume: 135.7 ML . Echogenicity within normal limits. No mass or hydronephrosis visualized.  Left Kidney:  Renal measurements: 10.7 x 5.5 x 5.2 cm. = volume: 161.2 mL. Echogenicity within normal limits. No mass or hydronephrosis visualized.  Bladder:  Appears normal for degree of bladder distention.  IMPRESSION: 1. Normal renal sonogram.  No hydronephrosis identified.   Electronically Signed By: Signa Kell M.D. On: 01/10/2019 10:36  No results found for this or any previous visit.  No results found for this or any previous visit.  Results for orders placed during the hospital encounter of 03/10/22  CT Renal Stone Study  Narrative CLINICAL DATA:  Flank pain  EXAM: CT ABDOMEN AND PELVIS WITHOUT CONTRAST  TECHNIQUE: Multidetector CT imaging of the abdomen and pelvis was performed following the standard protocol without IV contrast.  RADIATION DOSE REDUCTION: This exam was performed according to the departmental dose-optimization program which includes automated exposure control, adjustment of the mA and/or kV according to patient size and/or use of iterative reconstruction technique.  COMPARISON:  CT renal stone 08/31/2021  FINDINGS: Lower chest: No acute abnormality.  Hepatobiliary: No focal liver abnormality is seen. No gallstones, gallbladder wall thickening, or biliary dilatation.  Pancreas: Unremarkable. No pancreatic ductal dilatation or surrounding inflammatory changes.  Spleen: Normal in size without focal abnormality.  Adrenals/Urinary Tract: Adrenal glands are  normal. A few 1-2 mm calculi identified in the bilateral kidneys. 5 mm calculus in the distal right ureter approximately 5 cm above the bladder. Mild right hydroureteronephrosis and perinephric edema. Urinary bladder appears normal.  Stomach/Bowel: Stomach is within normal limits. Appendix appears normal. No evidence of bowel wall thickening, distention, or inflammatory changes.  Vascular/Lymphatic: No significant vascular findings are present. No enlarged abdominal or pelvic lymph nodes.  Reproductive: Uterus and bilateral adnexa are unremarkable.  Other: No ascites.  Musculoskeletal: No acute or significant osseous findings.  IMPRESSION: 1. 5 mm obstructing calculus in the distal right ureter. Mild right hydroureteronephrosis. 2. Bilateral small nephrolithiasis.   Electronically Signed By: Jannifer Hick M.D. On: 03/10/2022 10:53   Assessment & Plan:    1. Kidney stones -We discussed the management of kidney stones. These options include observation, ureteroscopy, shockwave lithotripsy (ESWL) and percutaneous nephrolithotomy (PCNL). We discussed which options are relevant to the patient's stone(s). We discussed the natural history of kidney stones as well as the complications of untreated stones and the impact on quality of life without treatment as well as with each of the above listed treatments. We also discussed the efficacy of each treatment in its ability to clear the stone burden. With any of these management options I discussed the signs and symptoms of infection and the need for emergent treatment should these be experienced. For each option we discussed the ability of each procedure to clear the patient of their stone burden.   For observation I described the risks which include but are not limited to silent renal damage, life-threatening infection, need for emergent surgery, failure to pass stone and pain.   For ureteroscopy I described the risks which include  bleeding, infection, damage to contiguous structures, positioning injury, ureteral stricture, ureteral avulsion, ureteral injury, need for prolonged ureteral stent, inability to perform ureteroscopy, need for an interval procedure, inability to clear stone burden, stent discomfort/pain, heart attack, stroke, pulmonary embolus and the inherent risks with general anesthesia.   For shockwave lithotripsy I described the risks which include arrhythmia, kidney contusion, kidney hemorrhage, need for transfusion, pain, inability to adequately break up stone, inability to pass stone fragments, Steinstrasse, infection associated with obstructing stones, need for alternate surgical procedure, need for repeat shockwave lithotripsy, MI, CVA, PE and the inherent risks with anesthesia/conscious sedation.   For PCNL I described the risks including positioning injury, pneumothorax, hydrothorax, need for chest tube, inability to clear stone burden, renal laceration, arterial venous fistula or malformation, need for embolization of kidney, loss of kidney or renal function, need for repeat procedure, need for prolonged nephrostomy tube, ureteral avulsion,  MI, CVA, PE and the inherent risks of general anesthesia.   - The patient would like to proceed with medical expulsive therapy. RTC 1 week with KUB - Urinalysis, Routine w reflex microscopic   No follow-ups on file.  Wilkie Aye, MD  [facility] [place]

## 2022-03-11 NOTE — Patient Instructions (Signed)
Dietary Guidelines to Help Prevent Kidney Stones Kidney stones are deposits of minerals and salts that form inside your kidneys. Your risk of developing kidney stones may be greater depending on your diet, your lifestyle, the medicines you take, and whether you have certain medical conditions. Most people can lower their chances of developing kidney stones by following the instructions below. Your dietitian may give you more specific instructions depending on your overall health and the type of kidney stones you tend to develop. What are tips for following this plan? Reading food labels  Choose foods with "no salt added" or "low-salt" labels. Limit your salt (sodium) intake to less than 1,500 mg a day. Choose foods with calcium for each meal and snack. Try to eat about 300 mg of calcium at each meal. Foods that contain 200-500 mg of calcium a serving include: 8 oz (237 mL) of milk, calcium-fortifiednon-dairy milk, and calcium-fortifiedfruit juice. Calcium-fortified means that calcium has been added to these drinks. 8 oz (237 mL) of kefir, yogurt, and soy yogurt. 4 oz (114 g) of tofu. 1 oz (28 g) of cheese. 1 cup (150 g) of dried figs. 1 cup (91 g) of cooked broccoli. One 3 oz (85 g) can of sardines or mackerel. Most people need 1,000-1,500 mg of calcium a day. Talk to your dietitian about how much calcium is recommended for you. Shopping Buy plenty of fresh fruits and vegetables. Most people do not need to avoid fruits and vegetables, even if these foods contain nutrients that may contribute to kidney stones. When shopping for convenience foods, choose: Whole pieces of fruit. Pre-made salads with dressing on the side. Low-fat fruit and yogurt smoothies. Avoid buying frozen meals or prepared deli foods. These can be high in sodium. Look for foods with live cultures, such as yogurt and kefir. Choose high-fiber grains, such as whole-wheat breads, oat bran, and wheat cereals. Cooking Do not add  salt to food when cooking. Place a salt shaker on the table and allow each person to add his or her own salt to taste. Use vegetable protein, such as beans, textured vegetable protein (TVP), or tofu, instead of meat in pasta, casseroles, and soups. Meal planning Eat less salt, if told by your dietitian. To do this: Avoid eating processed or pre-made food. Avoid eating fast food. Eat less animal protein, including cheese, meat, poultry, or fish, if told by your dietitian. To do this: Limit the number of times you have meat, poultry, fish, or cheese each week. Eat a diet free of meat at least 2 days a week. Eat only one serving each day of meat, poultry, fish, or seafood. When you prepare animal protein, cut pieces into small portion sizes. For most meat and fish, one serving is about the size of the palm of your hand. Eat at least five servings of fresh fruits and vegetables each day. To do this: Keep fruits and vegetables on hand for snacks. Eat one piece of fruit or a handful of berries with breakfast. Have a salad and fruit at lunch. Have two kinds of vegetables at dinner. Limit foods that are high in a substance called oxalate. These include: Spinach (cooked), rhubarb, beets, sweet potatoes, and Swiss chard. Peanuts. Potato chips, french fries, and baked potatoes with skin on. Nuts and nut products. Chocolate. If you regularly take a diuretic medicine, make sure to eat at least 1 or 2 servings of fruits or vegetables that are high in potassium each day. These include: Avocado. Banana. Orange, prune,   carrot, or tomato juice. Baked potato. Cabbage. Beans and split peas. Lifestyle  Drink enough fluid to keep your urine pale yellow. This is the most important thing you can do. Spread your fluid intake throughout the day. If you drink alcohol: Limit how much you use to: 0-1 drink a day for women who are not pregnant. 0-2 drinks a day for men. Be aware of how much alcohol is in your  drink. In the [place] one drink equals one 12 oz bottle of beer (355 mL), one 5 oz glass of wine (148 mL), or one 1 oz glass of hard liquor (44 mL). Lose weight if told by your health care provider. Work with your dietitian to find an eating plan and weight loss strategies that work best for you. General information Talk to your health care provider and dietitian about taking daily supplements. You may be told the following depending on your health and the cause of your kidney stones: Not to take supplements with vitamin C. To take a calcium supplement. To take a daily probiotic supplement. To take other supplements such as magnesium, fish oil, or vitamin B6. Take over-the-counter and prescription medicines only as told by your health care provider. These include supplements. What foods should I limit? Limit your intake of the following foods, or eat them as told by your dietitian. Vegetables Spinach. Rhubarb. Beets. Canned vegetables. Pickles. Olives. Baked potatoes with skin. Grains Wheat bran. Baked goods. Salted crackers. Cereals high in sugar. Meats and other proteins Nuts. Nut butters. Large portions of meat, poultry, or fish. Salted, precooked, or cured meats, such as sausages, meat loaves, and hot dogs. Dairy Cheese. Beverages Regular soft drinks. Regular vegetable juice. Seasonings and condiments Seasoning blends with salt. Salad dressings. Soy sauce. Ketchup. Barbecue sauce. Other foods Canned soups. Canned pasta sauce. Casseroles. Pizza. Lasagna. Frozen meals. Potato chips. French fries. The items listed above may not be a complete list of foods and beverages you should limit. Contact a dietitian for more information. What foods should I avoid? Talk to your dietitian about specific foods you should avoid based on the type of kidney stones you have and your overall health. Fruits Grapefruit. The item listed above may not be a complete list of foods and beverages you should  avoid. Contact a dietitian for more information. Summary Kidney stones are deposits of minerals and salts that form inside your kidneys. You can lower your risk of kidney stones by making changes to your diet. The most important thing you can do is drink enough fluid. Drink enough fluid to keep your urine pale yellow. Talk to your dietitian about how much calcium you should have each day, and eat less salt and animal protein as told by your dietitian. This information is not intended to replace advice given to you by your health care provider. Make sure you discuss any questions you have with your health care provider. Document Revised: 11/25/2019 Document Reviewed: 11/25/2019 Elsevier Patient Education  2022 [employer]  

## 2022-03-12 LAB — MICROSCOPIC EXAMINATION: Renal Epithel, UA: NONE SEEN /hpf

## 2022-03-12 LAB — URINALYSIS, ROUTINE W REFLEX MICROSCOPIC
Bilirubin, UA: NEGATIVE
Glucose, UA: NEGATIVE
Ketones, UA: NEGATIVE
Nitrite, UA: NEGATIVE
Protein,UA: NEGATIVE
Specific Gravity, UA: <1.005 — ABNORMAL LOW (ref 1.005–1.030)
Urobilinogen, Ur: 0.2 mg/dL (ref 0.2–1.0)
pH, UA: 6 (ref 5.0–7.5)

## 2022-03-14 ENCOUNTER — Other Ambulatory Visit: Payer: Self-pay | Admitting: Urology

## 2022-03-14 MED ORDER — ONDANSETRON HCL 4 MG PO TABS: 4.0000 mg | ORAL_TABLET | Freq: Four times a day (QID) | ORAL | 0 refills | Status: DC

## 2022-03-14 NOTE — Progress Notes (Unsigned)
Surgical Physician Order [facility] [place] ? ?* Scheduling expectation :  4/4 ? ?*Length of Case: 60 minutes ? ?*MD Preforming Case: Nicolette Bang, MD ? ?*Assistant Needed: no ? ?*Facility Preference: [facility] ? ?*Clearance needed: no ? ?*Anticoagulation Instructions: Hold all anticoagulants ? ?*Aspirin Instructions: Ok to continue Aspirin ? ?-Admit type: OUTpatient ? ?-Anesthesia: MAC ? ?-Use Standing Orders: ESWL ? ?*Diagnosis: Right Ureteral Stone ? ?*Procedure: right  ESWL ? ?Additional orders: N/A ? ?-Equipment:  NA ?-VTE Prophylaxis Standing Order SCD?s    ?   ?Other:  ? ?-Standing Lab Orders Per Anesthesia   ? ?Lab other: [facility] day of Procedure ? ?-Standing Test orders EKG/Chest x-ray per Anesthesia      ? ?Test other:  ? ?- Medications:   NA ? ?-Other orders:  N/A ? ?*Post-op visit Date/Instructions:   2 weeks [facility] ? ?

## 2022-03-14 NOTE — Progress Notes (Signed)
I spoke with Angelica Crawford. We have discussed possible surgery dates and 03/19/2022 was agreed upon by all parties. Patient given information about surgery date, what to expect pre-operatively and post operatively.  ?  ?We discussed that a pre-op nurse will be calling to set up the pre-op visit that will take place prior to surgery. Informed patient that our office will communicate any additional care to be provided after surgery.  ?  ?Patients questions or concerns were discussed during our call. Advised to call our office should there be any additional information, questions or concerns that arise. Patient verbalized understanding.   ?

## 2022-03-15 ENCOUNTER — Telehealth: Payer: Self-pay

## 2022-03-15 ENCOUNTER — Other Ambulatory Visit: Payer: Self-pay | Admitting: Urology

## 2022-03-15 MED ORDER — HYDROMORPHONE HCL 4 MG PO TABS: 4.0000 mg | ORAL_TABLET | ORAL | 0 refills | Status: DC | PRN

## 2022-03-15 NOTE — Patient Instructions (Signed)
? ? Angelica Crawford ? 03/15/2022  ?  ? @PREFPERIOPPHARMACY @ ? ? Your procedure is scheduled on 03/19/22. ? Report to 05/19/22 at 0900 A.M. ? Call this number if you have problems the morning of surgery: ? [phone] ? ? Remember: ? Do not eat or drink after midnight. ? ? Take these medicines the morning of surgery with A SIP OF WATER wellbutrin, prozac, ativan, flomax, topamax, zofran & dilaudid if needed.  ?  ? Do not wear jewelry, make-up or nail polish. ? Do not wear lotions, powders, or perfumes, or deodorant. ? Do not shave 48 hours prior to surgery.  Men may shave face and neck. ? Do not bring valuables to the hospital. ? [facility] is not responsible for any belongings or valuables. ? ?Contacts, dentures or bridgework may not be worn into surgery.  Leave your suitcase in the car.  After surgery it may be brought to your room. ? ?For patients admitted to the hospital, discharge time will be determined by your treatment team. ? ?Patients discharged the day of surgery will not be allowed to drive home.  ? ?Name and phone number of your driver:   family ?Special instructions:  n/a ? ?Please read over the following fact sheets that you were given. ?Anesthesia Post-op Instructions and Care and Recovery After Surgery ?  ? ? ? PATIENT INSTRUCTIONS ?POST-ANESTHESIA ? ?IMMEDIATELY FOLLOWING SURGERY:  Do not drive or operate machinery for the first twenty four hours after surgery.  Do not make any important decisions for twenty four hours after surgery or while taking narcotic pain medications or sedatives.  If you develop intractable nausea and vomiting or a severe headache please notify your doctor immediately. ? ?FOLLOW-UP:  Please make an appointment with your surgeon as instructed. You do not need to follow up with anesthesia unless specifically instructed to do so. ? ?WOUND CARE INSTRUCTIONS (if applicable):  Keep a dry clean dressing on the anesthesia/puncture wound site if there is drainage.  Once the wound  has quit draining you may leave it open to air.  Generally you should leave the bandage intact for twenty four hours unless there is drainage.  If the epidural site drains for more than 36-48 hours please call the anesthesia department. ? ?QUESTIONS?:  Please feel free to call your physician or the hospital operator if you have any questions, and they will be happy to assist you.    ? ? ?Lithotripsy ?Lithotripsy is a treatment that can help break up kidney stones that are too large to pass on their own. This is a nonsurgical procedure that crushes a kidney stone with shock waves. These shock waves pass through your body and focus on the kidney stone. They cause the kidney stone to break up into smaller pieces while it is still in the urinary tract. The smaller pieces of stone can pass more easily out of your body in the urine. ?Tell a health care provider about: ?Any allergies you have. ?All medicines you are taking, including vitamins, herbs, eye drops, creams, and over-the-counter medicines. ?Any problems you or family members have had with anesthetic medicines. ?Any blood disorders you have. ?Any surgeries you have had. ?Any medical conditions you have. ?Whether you are pregnant or may be pregnant. ?What are the risks? ?Generally, this is a safe procedure. However, problems may occur, including: ?Infection. ?Bleeding from the kidney. ?Bruising of the kidney or skin. ?Scarring of the kidney, which can lead to: ?Increased blood pressure. ?Poor kidney  function. ?Return (recurrence) of kidney stones. ?Damage to other structures or organs, such as the liver, colon, spleen, or pancreas. ?Blockage (obstruction) of the tube that carries urine from the kidney to the bladder (ureter). ?Failure of the kidney stone to break into pieces (fragments). ?What happens before the procedure? ?Staying hydrated ?Follow instructions from your health care provider about hydration, which may include: ?Up to 2 hours before the procedure -  you may continue to drink clear liquids, such as water, clear fruit juice, black coffee, and plain tea. ?Eating and drinking restrictions ?Follow instructions from your health care provider about eating and drinking, which may include: ?8 hours before the procedure - stop eating heavy meals or foods, such as meat, fried foods, or fatty foods. ?6 hours before the procedure - stop eating light meals or foods, such as toast or cereal. ?6 hours before the procedure - stop drinking milk or drinks that contain milk. ?2 hours before the procedure - stop drinking clear liquids. ?Medicines ?Ask your health care provider about: ?Changing or stopping your regular medicines. This is especially important if you are taking diabetes medicines or blood thinners. ?Taking medicines such as aspirin and ibuprofen. These medicines can thin your blood. Do not take these medicines unless your health care provider tells you to take them. ?Taking over-the-counter medicines, vitamins, herbs, and supplements. ?Tests ?You may have tests, such as: ?Blood tests. ?Urine tests. ?Imaging tests, such as a CT scan. ?General instructions ?Plan to have someone take you home from the hospital or clinic. ?If you will be going home right after the procedure, plan to have someone with you for 24 hours. ?Ask your health care provider what steps will be taken to help prevent infection. These may include washing skin with a germ-killing soap. ?What happens during the procedure? ? ?An IV will be inserted into one of your veins. ?You will be given one or more of the following: ?A medicine to help you relax (sedative). ?A medicine to make you fall asleep (general anesthetic). ?A water-filled cushion may be placed behind your kidney or on your abdomen. In some cases, you may be placed in a tub of lukewarm water. ?Your body will be positioned in a way that makes it easy to target the kidney stone. ?An X-ray or ultrasound exam will be done to locate your  stone. ?Shock waves will be aimed at the stone. If you are awake, you may feel a tapping sensation as the shock waves pass through your body. ?A flexible tube with holes in it (stent) may be placed in the ureter. This will help keep urine flowing from the kidney if the fragments of the stone have been blocking the ureter. ?The procedure may vary among health care providers and hospitals. ?What happens after the procedure? ?You may have an X-ray to see whether the procedure was able to break up the kidney stone and how much of the stone has passed. If large stone fragments remain after treatment, you may need to have a second procedure at a later time. ?Your blood pressure, heart rate, breathing rate, and blood oxygen level will be monitored until you leave the hospital or clinic. ?You may be given antibiotics or pain medicine as needed. ?If a stent was placed in your ureter during surgery, it may stay in place for a few weeks. ?You may need to strain your urine to collect pieces of the kidney stone for testing. ?You will need to drink plenty of water. ?If you were  given a sedative during the procedure, it can affect you for several hours. Do not drive or operate machinery until your health care provider says that it is safe. ?Summary ?Lithotripsy is a treatment that can help break up kidney stones that are too large to pass on their own. ?Lithotripsy is a nonsurgical procedure that crushes a kidney stone with shock waves. ?Generally, this is a safe procedure. However, problems may occur, including damage to the kidney or other organs, infection, or obstruction of the tube that carries urine from the kidney to the bladder (ureter). ?You may have a stent placed in your ureter to help drain your urine. This stent may stay in place for a few weeks. ?After the procedure, you will need to drink plenty of water. You may be asked to strain your urine to collect pieces of the kidney stone for testing. ?This information is  not intended to replace advice given to you by your health care provider. Make sure you discuss any questions you have with your health care provider. ?Document Revised: 09/14/2019 Document Reviewed: 09/15/2019

## 2022-03-15 NOTE — Telephone Encounter (Signed)
Medication was refilled by MD and patient made aware.  ?

## 2022-03-15 NOTE — Telephone Encounter (Signed)
Patient called advising she needed medication refilled.  ? ? ?Medication: ?HYDROmorphone (DILAUDID) 4 MG tablet ? ?Pharmacy: ? ?[id number] - [place], [place] - [address] ?

## 2022-03-18 ENCOUNTER — Encounter (HOSPITAL_COMMUNITY)
Admission: RE | Admit: 2022-03-18 | Discharge: 2022-03-18 | Disposition: A | Discharge status: Home / Self Care | Payer: BC Managed Care – PPO | Source: Ambulatory Visit | Attending: Urology | Admitting: Urology

## 2022-03-18 NOTE — Pre-Procedure Instructions (Signed)
Attempted pre-op phone call. Left a voicemail for her to call back. °

## 2022-03-19 ENCOUNTER — Encounter (HOSPITAL_COMMUNITY)
Admission: RE | Disposition: A | Discharge status: Home / Self Care | Payer: Self-pay | Source: Ambulatory Visit | Attending: Urology

## 2022-03-19 ENCOUNTER — Ambulatory Visit (HOSPITAL_COMMUNITY)
Admission: RE | Admit: 2022-03-19 | Discharge: 2022-03-19 | Disposition: A | Discharge status: Home / Self Care | Payer: BC Managed Care – PPO | Source: Ambulatory Visit | Attending: Urology | Admitting: Urology

## 2022-03-19 ENCOUNTER — Ambulatory Visit (HOSPITAL_COMMUNITY): Payer: BC Managed Care – PPO

## 2022-03-19 ENCOUNTER — Encounter (HOSPITAL_COMMUNITY): Payer: Self-pay | Admitting: Urology

## 2022-03-19 DIAGNOSIS — E669 Obesity, unspecified: Secondary | ICD-10-CM | POA: Insufficient documentation

## 2022-03-19 DIAGNOSIS — N132 Hydronephrosis with renal and ureteral calculous obstruction: Secondary | ICD-10-CM | POA: Diagnosis not present

## 2022-03-19 DIAGNOSIS — G4730 Sleep apnea, unspecified: Secondary | ICD-10-CM | POA: Diagnosis not present

## 2022-03-19 DIAGNOSIS — N201 Calculus of ureter: Secondary | ICD-10-CM | POA: Diagnosis present

## 2022-03-19 HISTORY — PX: EXTRACORPOREAL SHOCK WAVE LITHOTRIPSY: SHX1557

## 2022-03-19 SURGERY — LITHOTRIPSY, ESWL
Anesthesia: LOCAL | Laterality: Right

## 2022-03-19 MED ORDER — SODIUM CHLORIDE 0.9 % IV SOLN
Freq: Once | INTRAVENOUS | Status: AC
  Administered 2022-03-19: 1000 mL via INTRAVENOUS

## 2022-03-19 MED ORDER — DIAZEPAM 5 MG PO TABS
10.0000 mg | ORAL_TABLET | Freq: Once | ORAL | Status: AC
  Administered 2022-03-19: 10 mg via ORAL
  Filled 2022-03-19: qty 2

## 2022-03-19 MED ORDER — DIPHENHYDRAMINE HCL 25 MG PO CAPS
25.0000 mg | ORAL_CAPSULE | ORAL | Status: AC
  Administered 2022-03-19: 25 mg via ORAL
  Filled 2022-03-19: qty 1

## 2022-03-19 MED ORDER — TAMSULOSIN HCL 0.4 MG PO CAPS: 0.4000 mg | ORAL_CAPSULE | Freq: Every day | ORAL | 0 refills | Status: DC

## 2022-03-19 MED ORDER — ONDANSETRON HCL 4 MG PO TABS: 4.0000 mg | ORAL_TABLET | Freq: Four times a day (QID) | ORAL | 0 refills | Status: DC

## 2022-03-19 MED ORDER — HYDROMORPHONE HCL 4 MG PO TABS
4.0000 mg | ORAL_TABLET | ORAL | 0 refills | Status: DC | PRN
Start: 2022-03-19 — End: 2022-05-01

## 2022-03-19 NOTE — Interval H&P Note (Signed)
History and Physical Interval Note: ? ?03/19/2022 ?10:12 AM ? ?Angelica Crawford  has presented today for surgery, with the diagnosis of right ureteral stone.  The various methods of treatment have been discussed with the patient and family. After consideration of risks, benefits and other options for treatment, the patient has consented to  Procedure(s): ?EXTRACORPOREAL SHOCK WAVE LITHOTRIPSY (ESWL) (Right) as a surgical intervention.  The patient's history has been reviewed, patient examined, no change in status, stable for surgery.  I have reviewed the patient's chart and labs.  Questions were answered to the patient's satisfaction.   ? ? ?Wilkie Aye ? ? ?

## 2022-03-20 ENCOUNTER — Ambulatory Visit: Payer: BC Managed Care – PPO | Admitting: Urology

## 2022-03-20 ENCOUNTER — Encounter (HOSPITAL_COMMUNITY): Payer: Self-pay | Admitting: Urology

## 2022-04-03 ENCOUNTER — Ambulatory Visit: Payer: BC Managed Care – PPO | Admitting: Physician Assistant

## 2022-04-26 ENCOUNTER — Other Ambulatory Visit: Payer: Self-pay | Admitting: Urology

## 2022-04-30 ENCOUNTER — Telehealth: Payer: Self-pay

## 2022-04-30 NOTE — Telephone Encounter (Signed)
Patient requesting pain medication for kidney stone pain. Message sent to MD.  ?

## 2022-05-01 ENCOUNTER — Ambulatory Visit: Payer: BC Managed Care – PPO | Admitting: Urology

## 2022-05-01 ENCOUNTER — Encounter: Payer: Self-pay | Admitting: Urology

## 2022-05-01 ENCOUNTER — Other Ambulatory Visit: Payer: Self-pay | Admitting: Urology

## 2022-05-01 VITALS — BP 140/91 | HR 89

## 2022-05-01 DIAGNOSIS — N200 Calculus of kidney: Secondary | ICD-10-CM

## 2022-05-01 DIAGNOSIS — N201 Calculus of ureter: Secondary | ICD-10-CM | POA: Diagnosis not present

## 2022-05-01 MED ORDER — HYDROMORPHONE HCL 4 MG PO TABS: 4.0000 mg | ORAL_TABLET | ORAL | 0 refills | Status: DC | PRN

## 2022-05-01 MED ORDER — ALFUZOSIN HCL ER 10 MG PO TB24: 10.0000 mg | ORAL_TABLET | Freq: Every day | ORAL | 0 refills | Status: DC

## 2022-05-01 MED ORDER — KETOROLAC TROMETHAMINE 60 MG/2ML IM SOLN
60.0000 mg | Freq: Once | INTRAMUSCULAR | Status: AC
  Administered 2022-05-01: 60 mg via INTRAMUSCULAR

## 2022-05-01 NOTE — Progress Notes (Signed)
05/01/2022 4:05 PM   Angelica Crawford 23-Sep-1986 [id number]  Referring provider: Sigmund Hazel, MD [address] [place],  [place] [address]  Left flank pain   HPI: Angelica Crawford is a 35yo here for followup for nephrolithiasis. She developed left flank pain. She passed a small calculus Tuesday. She continue left flan k pain that is sharp, intermittent, nonradiating. No significant LUTS.    PMH: Past Medical History:  Diagnosis Date   Back pain    Kidney stones    Seizures [facility])     Surgical History: Past Surgical History:  Procedure Laterality Date   EXTRACORPOREAL SHOCK WAVE LITHOTRIPSY Right 03/19/2022   Procedure: EXTRACORPOREAL SHOCK WAVE LITHOTRIPSY (ESWL);  Surgeon: Malen Gauze, MD;  Location: [facility];  Service: Urology;  Laterality: Right;    Home Medications:  Allergies as of 05/01/2022       Reactions   Penicillins Anaphylaxis   Sulfa Antibiotics Anaphylaxis   Gabapentin Other (See Comments)   tremors   Salicylic Acid Other (See Comments)        Medication List        Accurate as of May 01, 2022  4:05 PM. If you have any questions, ask your nurse or doctor.          buPROPion 150 MG 24 hr tablet Commonly known as: WELLBUTRIN XL Take 150 mg by mouth every morning.   EPINEPHrine 0.3 mg/0.3 mL Soaj injection Commonly known as: EPI-PEN Inject 0.3 mg into the muscle as needed for anaphylaxis.   FLUoxetine 40 MG capsule Commonly known as: PROZAC Take 80 mg by mouth daily.   HYDROmorphone 4 MG tablet Commonly known as: Dilaudid Take 1 tablet (4 mg total) by mouth every 4 (four) hours as needed for severe pain.   LORazepam 0.5 MG tablet Commonly known as: ATIVAN Take 0.5 mg by mouth daily as needed for anxiety.   ondansetron 4 MG tablet Commonly known as: ZOFRAN Take 1 tablet (4 mg total) by mouth every 6 (six) hours.   phentermine 37.5 MG capsule Take 37.5 mg by mouth every morning.   topiramate 100 MG tablet Commonly known as:  TOPAMAX Take 100 mg by mouth daily.   zolpidem 10 MG tablet Commonly known as: AMBIEN Take 10 mg by mouth at bedtime.        Allergies:  Allergies  Allergen Reactions   Penicillins Anaphylaxis   Sulfa Antibiotics Anaphylaxis   Gabapentin Other (See Comments)    tremors   Salicylic Acid Other (See Comments)    Family History: Family History  Problem Relation Age of Onset   Multiple sclerosis Mother    Cancer Mother    Breast cancer Mother     Social History:  reports that she has never smoked. She has never used smokeless tobacco. She reports current alcohol use. She reports that she does not use drugs.  ROS: All other review of systems were reviewed and are negative except what is noted above in HPI  Physical Exam: BP (!) 140/91   Pulse 89   Constitutional:  Alert and oriented, No acute distress. HEENT: [place] AT, moist mucus membranes.  Trachea midline, no masses. Cardiovascular: No clubbing, cyanosis, or edema. Respiratory: Normal respiratory effort, no increased work of breathing. GI: Abdomen is soft, nontender, nondistended, no abdominal masses GU: No CVA tenderness.  Lymph: No cervical or inguinal lymphadenopathy. Skin: No rashes, bruises or suspicious lesions. Neurologic: Grossly intact, no focal deficits, moving all 4 extremities. Psychiatric: Normal mood and  affect.  Laboratory Data: Lab Results  Component Value Date   WBC 5.1 03/10/2022   HGB 13.1 03/10/2022   HCT 42.1 03/10/2022   MCV 90.0 03/10/2022   PLT 266 03/10/2022    Lab Results  Component Value Date   CREATININE 0.95 03/10/2022    No results found for: PSA  No results found for: TESTOSTERONE  No results found for: HGBA1C  Urinalysis    Component Value Date/Time   COLORURINE YELLOW 03/10/2022 1141   APPEARANCEUR Clear 03/11/2022 1618   LABSPEC 1.017 03/10/2022 1141   PHURINE 7.0 03/10/2022 1141   GLUCOSEU Negative 03/11/2022 1618   HGBUR MODERATE (A) 03/10/2022 1141    BILIRUBINUR Negative 03/11/2022 1618   KETONESUR NEGATIVE 03/10/2022 1141   PROTEINUR Negative 03/11/2022 1618   PROTEINUR NEGATIVE 03/10/2022 1141   UROBILINOGEN 1.0 06/30/2020 1806   NITRITE Negative 03/11/2022 1618   NITRITE NEGATIVE 03/10/2022 1141   LEUKOCYTESUR Trace (A) 03/11/2022 1618   LEUKOCYTESUR NEGATIVE 03/10/2022 1141    Lab Results  Component Value Date   LABMICR See below: 03/11/2022   WBCUA 0-5 03/11/2022   LABEPIT 0-10 03/11/2022   BACTERIA Few 03/11/2022    Pertinent Imaging: CT 03/10/2022: Images reviewed and discussed with the patient  Results for orders placed during the hospital encounter of 03/19/22  DG Abd 1 View  Narrative CLINICAL DATA:  Nephrolithiasis, evaluate prior to lithotripsy, right-sided symptoms  EXAM: ABDOMEN - 1 VIEW  COMPARISON:  03/11/2022 abdominal radiograph  FINDINGS: A 3 mm calcification overlying the right ureterovesical junction likely represents antegrade progression of the previously visualized right pelvic ureteral stone. No additional radiopaque stones overlie the kidneys or expected locations of the ureters or bladder. No dilated small bowel loops. Mild-to-moderate colonic stool. No evidence of pneumatosis or pneumoperitoneum.  IMPRESSION: Suggestion of a 3 mm right ureterovesical junction stone.   Electronically Signed By: Delbert Phenix M.D. On: 03/19/2022 09:05  No results found for this or any previous visit.  No results found for this or any previous visit.  No results found for this or any previous visit.  Results for orders placed during the hospital encounter of 01/10/19  US Renal  Narrative CLINICAL DATA:  Left flank pain.  Left UVJ stone.  EXAM: RENAL / URINARY TRACT ULTRASOUND COMPLETE  COMPARISON:  01/05/2019  FINDINGS: Right Kidney:  Renal measurements: 11.1 x 4.9 by 4.8 cm = volume: 135.7 ML . Echogenicity within normal limits. No mass or hydronephrosis visualized.  Left  Kidney:  Renal measurements: 10.7 x 5.5 x 5.2 cm. = volume: 161.2 mL. Echogenicity within normal limits. No mass or hydronephrosis visualized.  Bladder:  Appears normal for degree of bladder distention.  IMPRESSION: 1. Normal renal sonogram.  No hydronephrosis identified.   Electronically Signed By: Signa Kell M.D. On: 01/10/2019 10:36  No results found for this or any previous visit.  No results found for this or any previous visit.  Results for orders placed during the hospital encounter of 03/10/22  CT Renal Stone Study  Narrative CLINICAL DATA:  Flank pain  EXAM: CT ABDOMEN AND PELVIS WITHOUT CONTRAST  TECHNIQUE: Multidetector CT imaging of the abdomen and pelvis was performed following the standard protocol without IV contrast.  RADIATION DOSE REDUCTION: This exam was performed according to the departmental dose-optimization program which includes automated exposure control, adjustment of the mA and/or kV according to patient size and/or use of iterative reconstruction technique.  COMPARISON:  CT renal stone 08/31/2021  FINDINGS: Lower chest: No acute  abnormality.  Hepatobiliary: No focal liver abnormality is seen. No gallstones, gallbladder wall thickening, or biliary dilatation.  Pancreas: Unremarkable. No pancreatic ductal dilatation or surrounding inflammatory changes.  Spleen: Normal in size without focal abnormality.  Adrenals/Urinary Tract: Adrenal glands are normal. A few 1-2 mm calculi identified in the bilateral kidneys. 5 mm calculus in the distal right ureter approximately 5 cm above the bladder. Mild right hydroureteronephrosis and perinephric edema. Urinary bladder appears normal.  Stomach/Bowel: Stomach is within normal limits. Appendix appears normal. No evidence of bowel wall thickening, distention, or inflammatory changes.  Vascular/Lymphatic: No significant vascular findings are present. No enlarged abdominal or pelvic lymph  nodes.  Reproductive: Uterus and bilateral adnexa are unremarkable.  Other: No ascites.  Musculoskeletal: No acute or significant osseous findings.  IMPRESSION: 1. 5 mm obstructing calculus in the distal right ureter. Mild right hydroureteronephrosis. 2. Bilateral small nephrolithiasis.   Electronically Signed By: Jannifer Hickelaney  Williams M.D. On: 03/10/2022 10:53   Assessment & Plan:    1. Ureterolithiasis -We discussed the management of kidney stones. These options include observation, ureteroscopy, shockwave lithotripsy (ESWL) and percutaneous nephrolithotomy (PCNL). We discussed which options are relevant to the patient's stone(s). We discussed the natural history of kidney stones as well as the complications of untreated stones and the impact on quality of life without treatment as well as with each of the above listed treatments. We also discussed the efficacy of each treatment in its ability to clear the stone burden. With any of these management options I discussed the signs and symptoms of infection and the need for emergent treatment should these be experienced. For each option we discussed the ability of each procedure to clear the patient of their stone burden.   For observation I described the risks which include but are not limited to silent renal damage, life-threatening infection, need for emergent surgery, failure to pass stone and pain.   For ureteroscopy I described the risks which include bleeding, infection, damage to contiguous structures, positioning injury, ureteral stricture, ureteral avulsion, ureteral injury, need for prolonged ureteral stent, inability to perform ureteroscopy, need for an interval procedure, inability to clear stone burden, stent discomfort/pain, heart attack, stroke, pulmonary embolus and the inherent risks with general anesthesia.   For shockwave lithotripsy I described the risks which include arrhythmia, kidney contusion, kidney hemorrhage, need  for transfusion, pain, inability to adequately break up stone, inability to pass stone fragments, Steinstrasse, infection associated with obstructing stones, need for alternate surgical procedure, need for repeat shockwave lithotripsy, MI, CVA, PE and the inherent risks with anesthesia/conscious sedation.   For PCNL I described the risks including positioning injury, pneumothorax, hydrothorax, need for chest tube, inability to clear stone burden, renal laceration, arterial venous fistula or malformation, need for embolization of kidney, loss of kidney or renal function, need for repeat procedure, need for prolonged nephrostomy tube, ureteral avulsion, MI, CVA, PE and the inherent risks of general anesthesia.   - The patient would like to proceed with medical expulsive therapy. Flomax and dilaudid rx given. RTC 1 week for symptoms check   No follow-ups on file.  Wilkie AyePatrick Chritopher Coster, MD  [facility] [place]

## 2022-05-01 NOTE — Patient Instructions (Signed)
Dietary Guidelines to Help Prevent Kidney Stones Kidney stones are deposits of minerals and salts that form inside your kidneys. Your risk of developing kidney stones may be greater depending on your diet, your lifestyle, the medicines you take, and whether you have certain medical conditions. Most people can lower their chances of developing kidney stones by following the instructions below. Your dietitian may give you more specific instructions depending on your overall health and the type of kidney stones you tend to develop. What are tips for following this plan? Reading food labels  Choose foods with "no salt added" or "low-salt" labels. Limit your salt (sodium) intake to less than 1,500 mg a day. Choose foods with calcium for each meal and snack. Try to eat about 300 mg of calcium at each meal. Foods that contain 200-500 mg of calcium a serving include: 8 oz (237 mL) of milk, calcium-fortifiednon-dairy milk, and calcium-fortifiedfruit juice. Calcium-fortified means that calcium has been added to these drinks. 8 oz (237 mL) of kefir, yogurt, and soy yogurt. 4 oz (114 g) of tofu. 1 oz (28 g) of cheese. 1 cup (150 g) of dried figs. 1 cup (91 g) of cooked broccoli. One 3 oz (85 g) can of sardines or mackerel. Most people need 1,000-1,500 mg of calcium a day. Talk to your dietitian about how much calcium is recommended for you. Shopping Buy plenty of fresh fruits and vegetables. Most people do not need to avoid fruits and vegetables, even if these foods contain nutrients that may contribute to kidney stones. When shopping for convenience foods, choose: Whole pieces of fruit. Pre-made salads with dressing on the side. Low-fat fruit and yogurt smoothies. Avoid buying frozen meals or prepared deli foods. These can be high in sodium. Look for foods with live cultures, such as yogurt and kefir. Choose high-fiber grains, such as whole-wheat breads, oat bran, and wheat cereals. Cooking Do not add  salt to food when cooking. Place a salt shaker on the table and allow each person to add his or her own salt to taste. Use vegetable protein, such as beans, textured vegetable protein (TVP), or tofu, instead of meat in pasta, casseroles, and soups. Meal planning Eat less salt, if told by your dietitian. To do this: Avoid eating processed or pre-made food. Avoid eating fast food. Eat less animal protein, including cheese, meat, poultry, or fish, if told by your dietitian. To do this: Limit the number of times you have meat, poultry, fish, or cheese each week. Eat a diet free of meat at least 2 days a week. Eat only one serving each day of meat, poultry, fish, or seafood. When you prepare animal protein, cut pieces into small portion sizes. For most meat and fish, one serving is about the size of the palm of your hand. Eat at least five servings of fresh fruits and vegetables each day. To do this: Keep fruits and vegetables on hand for snacks. Eat one piece of fruit or a handful of berries with breakfast. Have a salad and fruit at lunch. Have two kinds of vegetables at dinner. Limit foods that are high in a substance called oxalate. These include: Spinach (cooked), rhubarb, beets, sweet potatoes, and Swiss chard. Peanuts. Potato chips, french fries, and baked potatoes with skin on. Nuts and nut products. Chocolate. If you regularly take a diuretic medicine, make sure to eat at least 1 or 2 servings of fruits or vegetables that are high in potassium each day. These include: Avocado. Banana. Orange, prune,   carrot, or tomato juice. Baked potato. Cabbage. Beans and split peas. Lifestyle  Drink enough fluid to keep your urine pale yellow. This is the most important thing you can do. Spread your fluid intake throughout the day. If you drink alcohol: Limit how much you use to: 0-1 drink a day for women who are not pregnant. 0-2 drinks a day for men. Be aware of how much alcohol is in your  drink. In the [place] one drink equals one 12 oz bottle of beer (355 mL), one 5 oz glass of wine (148 mL), or one 1 oz glass of hard liquor (44 mL). Lose weight if told by your health care provider. Work with your dietitian to find an eating plan and weight loss strategies that work best for you. General information Talk to your health care provider and dietitian about taking daily supplements. You may be told the following depending on your health and the cause of your kidney stones: Not to take supplements with vitamin C. To take a calcium supplement. To take a daily probiotic supplement. To take other supplements such as magnesium, fish oil, or vitamin B6. Take over-the-counter and prescription medicines only as told by your health care provider. These include supplements. What foods should I limit? Limit your intake of the following foods, or eat them as told by your dietitian. Vegetables Spinach. Rhubarb. Beets. Canned vegetables. Pickles. Olives. Baked potatoes with skin. Grains Wheat bran. Baked goods. Salted crackers. Cereals high in sugar. Meats and other proteins Nuts. Nut butters. Large portions of meat, poultry, or fish. Salted, precooked, or cured meats, such as sausages, meat loaves, and hot dogs. Dairy Cheese. Beverages Regular soft drinks. Regular vegetable juice. Seasonings and condiments Seasoning blends with salt. Salad dressings. Soy sauce. Ketchup. Barbecue sauce. Other foods Canned soups. Canned pasta sauce. Casseroles. Pizza. Lasagna. Frozen meals. Potato chips. French fries. The items listed above may not be a complete list of foods and beverages you should limit. Contact a dietitian for more information. What foods should I avoid? Talk to your dietitian about specific foods you should avoid based on the type of kidney stones you have and your overall health. Fruits Grapefruit. The item listed above may not be a complete list of foods and beverages you should  avoid. Contact a dietitian for more information. Summary Kidney stones are deposits of minerals and salts that form inside your kidneys. You can lower your risk of kidney stones by making changes to your diet. The most important thing you can do is drink enough fluid. Drink enough fluid to keep your urine pale yellow. Talk to your dietitian about how much calcium you should have each day, and eat less salt and animal protein as told by your dietitian. This information is not intended to replace advice given to you by your health care provider. Make sure you discuss any questions you have with your health care provider. Document Revised: 08/13/2021 Document Reviewed: 08/13/2021 Elsevier Patient Education  2023 [employer]vier Inc.  

## 2022-05-07 ENCOUNTER — Telehealth: Payer: Self-pay

## 2022-05-07 LAB — CALCULI, WITH PHOTOGRAPH (CLINICAL LAB)
Calcium Oxalate Dihydrate: 30 %
Calcium Oxalate Monohydrate: 65 %
Hydroxyapatite: 5 %
Weight Calculi: 2 mg

## 2022-05-07 NOTE — Telephone Encounter (Signed)
Patient called advising that she needed a refill on pain medication.   Medication: HYDROmorphone (DILAUDID) 4 MG tablet   Pharmacy: [id number] - [place], [place] - [address]   Thank you!

## 2022-05-07 NOTE — Telephone Encounter (Signed)
Please advise 

## 2022-05-08 ENCOUNTER — Other Ambulatory Visit: Payer: Self-pay | Admitting: Urology

## 2022-05-08 MED ORDER — HYDROMORPHONE HCL 4 MG PO TABS: 4.0000 mg | ORAL_TABLET | ORAL | 0 refills | Status: DC | PRN

## 2022-05-15 ENCOUNTER — Other Ambulatory Visit: Payer: Self-pay

## 2022-05-15 NOTE — Telephone Encounter (Signed)
Patient call back  °

## 2022-05-15 NOTE — Telephone Encounter (Signed)
Patient called advising that she still has not passed her kidney stone and needs a refill on medication.  Medication: HYDROmorphone (DILAUDID) 4 MG tablet   Pharmacy: [id number] - [place], [place] - [address]

## 2022-05-16 NOTE — Telephone Encounter (Signed)
Patient calling back for another refill of  HYDROmorphone (DILAUDID) 4 MG tablet   Call pt back at [phone]  to let her know.  Thanks, [name]

## 2022-05-17 ENCOUNTER — Telehealth: Payer: Self-pay

## 2022-05-17 MED ORDER — HYDROMORPHONE HCL 4 MG PO TABS: 4.0000 mg | ORAL_TABLET | ORAL | 0 refills | Status: DC | PRN

## 2022-05-17 NOTE — Telephone Encounter (Signed)
Rx refilled and patient called made aware.

## 2022-05-17 NOTE — Telephone Encounter (Signed)
Returned patient's call and made her aware of rx sent in.

## 2022-05-27 ENCOUNTER — Ambulatory Visit (HOSPITAL_COMMUNITY): Payer: BC Managed Care – PPO | Attending: Physician Assistant

## 2022-05-27 ENCOUNTER — Ambulatory Visit (HOSPITAL_COMMUNITY)
Admission: EM | Admit: 2022-05-27 | Discharge: 2022-05-27 | Disposition: A | Discharge status: Home / Self Care | Payer: Worker's Compensation | Attending: Physician Assistant | Admitting: Physician Assistant

## 2022-05-27 ENCOUNTER — Encounter (HOSPITAL_COMMUNITY): Payer: Self-pay | Admitting: Emergency Medicine

## 2022-05-27 DIAGNOSIS — M545 Low back pain, unspecified: Secondary | ICD-10-CM

## 2022-05-27 DIAGNOSIS — W19XXXA Unspecified fall, initial encounter: Secondary | ICD-10-CM

## 2022-05-27 MED ORDER — KETOROLAC TROMETHAMINE 30 MG/ML IJ SOLN
INTRAMUSCULAR | Status: AC
  Filled 2022-05-27: qty 1

## 2022-05-27 MED ORDER — ACETAMINOPHEN 325 MG PO TABS
ORAL_TABLET | ORAL | Status: AC
  Filled 2022-05-27: qty 3

## 2022-05-27 MED ORDER — KETOROLAC TROMETHAMINE 30 MG/ML IJ SOLN
30.0000 mg | Freq: Once | INTRAMUSCULAR | Status: AC
  Administered 2022-05-27: 30 mg via INTRAMUSCULAR

## 2022-05-27 MED ORDER — METHOCARBAMOL 500 MG PO TABS: 500.0000 mg | ORAL_TABLET | Freq: Three times a day (TID) | ORAL | 0 refills | Status: DC | PRN

## 2022-05-27 MED ORDER — HYDROCODONE-ACETAMINOPHEN 5-325 MG PO TABS: 1.0000 | ORAL_TABLET | Freq: Every evening | ORAL | 0 refills | Status: AC | PRN

## 2022-05-27 MED ORDER — ACETAMINOPHEN 325 MG PO TABS
975.0000 mg | ORAL_TABLET | Freq: Once | ORAL | Status: AC
  Administered 2022-05-27: 975 mg via ORAL

## 2022-05-27 NOTE — ED Provider Notes (Signed)
[facility]    CSN: [id number] Arrival date & time: 05/27/22  1642      History   Chief Complaint Chief Complaint  Patient presents with   Fall   Back Pain    HPI Angelica Crawford is a 36 y.o. female.   Patient presents today with a several hour history of lumbar back and left-sided SI joint pain.  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  She does believe that she hit her head but denies any loss of consciousness.  Since then she has had no ongoing headache, dizziness, nausea, vomiting, amnesia surrounding event.  She does not take blood thinning medication.  She reports that pain is rated 8 on a 0-10 pain scale, localized to lumbar spine with radiation into left buttocks and into posterior left leg, described as aching hepatic shooting, no alleviating factors identified.  She does report injuring her back several years ago but has not had any surgery.  She has not tried any over-the-counter medication for symptom management as symptoms began just a few hours ago.  Denies any bowel/bladder incontinence, lower extremity weakness, saddle anesthesia.  She does have a history of allergy to salicylic acid but reports she has safely taken Toradol in the past and is requesting injection if appropriate today.  She is confident that she is not pregnant.    Past Medical History:  Diagnosis Date   Back pain    Kidney stones    Seizures (HCC)     There are no problems to display for this patient.   Past Surgical History:  Procedure Laterality Date   EXTRACORPOREAL SHOCK WAVE LITHOTRIPSY Right 03/19/2022   Procedure: EXTRACORPOREAL SHOCK WAVE LITHOTRIPSY (ESWL);  Surgeon: Malen Gauze, MD;  Location: [facility];  Service: Urology;  Laterality: Right;    OB History     Gravida  0   Para  0   Term  0   Preterm  0   AB  0   Living  0      SAB  0   IAB  0    Ectopic  0   Multiple  0   Live Births  0            Home Medications    Prior to Admission medications   Medication Sig Start Date End Date Taking? Authorizing Provider  HYDROcodone-acetaminophen (NORCO/VICODIN) 5-325 MG tablet Take 1 tablet by mouth at bedtime as needed for up to 3 days. 05/27/22 05/30/22 Yes Forrest Jaroszewski, Noberto Retort, PA-C  methocarbamol (ROBAXIN) 500 MG tablet Take 1 tablet (500 mg total) by mouth every 8 (eight) hours as needed for muscle spasms. 05/27/22  Yes Tishawna Larouche, Denny Peon K, PA-C  SUMAtriptan (IMITREX) 100 MG tablet Take 100 mg by mouth every 2 (two) hours as needed for migraine.  08/25/19 11/29/19  [provider]  alfuzosin (UROXATRAL) 10 MG 24 hr tablet TAKE 1 TABLET(10 MG) BY MOUTH DAILY WITH BREAKFAST 05/02/22   McKenzie, Mardene Celeste, MD  buPROPion (WELLBUTRIN XL) 150 MG 24 hr tablet Take 150 mg by mouth every morning. 06/12/20   [provider]  EPINEPHrine 0.3 mg/0.3 mL IJ SOAJ injection Inject 0.3 mg into the muscle as needed for anaphylaxis.  06/12/20   [provider]  FLUoxetine (PROZAC) 40 MG capsule Take 80 mg by mouth daily.  08/15/18   [provider]  LORazepam (ATIVAN) 0.5 MG tablet Take 0.5 mg by mouth daily as needed for anxiety.  Patient not taking: Reported on 03/11/2022 10/21/19   [provider]  ondansetron (ZOFRAN) 4 MG tablet Take 1 tablet (4 mg total) by mouth every 6 (six) hours. 03/19/22   McKenzie, Mardene Celeste, MD  phentermine 37.5 MG capsule Take 37.5 mg by mouth every morning. Patient not taking: Reported on 03/11/2022    [provider]  tamsulosin (FLOMAX) 0.4 MG CAPS capsule TAKE 1 CAPSULE(0.4 MG) BY MOUTH DAILY FOR 14 DAYS 05/02/22   McKenzie, Mardene Celeste, MD  topiramate (TOPAMAX) 100 MG tablet Take 100 mg by mouth daily. 12/19/21   [provider]  zolpidem (AMBIEN) 10 MG tablet Take 10 mg by mouth at bedtime. Patient not taking: Reported on 03/11/2022 07/26/21   [provider]    Family  History Family History  Problem Relation Age of Onset   Multiple sclerosis Mother    Cancer Mother    Breast cancer Mother     Social History Social History   Tobacco Use   Smoking status: Never   Smokeless tobacco: Never  Vaping Use   Vaping Use: Never used  Substance Use Topics   Alcohol use: Yes    Comment: occas   Drug use: No     Allergies   Penicillins, Sulfa antibiotics, Gabapentin, and Salicylic acid   Review of Systems Review of Systems  Constitutional:  Positive for activity change. Negative for appetite change, fatigue and fever.  Musculoskeletal:  Positive for arthralgias and back pain. Negative for myalgias.  Neurological:  Negative for dizziness, syncope, facial asymmetry, weakness, light-headedness, numbness and headaches.     Physical Exam Triage Vital Signs ED Triage Vitals  Enc Vitals Group     BP 05/27/22 1820 138/87     Pulse Rate 05/27/22 1820 77     Resp 05/27/22 1820 18     Temp 05/27/22 1820 97.7 F (36.5 C)     Temp src --      SpO2 05/27/22 1820 97 %     Weight --      Height --      Head Circumference --      Peak Flow --      Pain Score 05/27/22 1821 8     Pain Loc --      Pain Edu? --      Excl. in GC? --    No data found.  Updated Vital Signs BP 138/87   Pulse 77   Temp 97.7 F (36.5 C)   Resp 18   SpO2 97%   Visual Acuity Right Eye Distance:   Left Eye Distance:   Bilateral Distance:    Right Eye Near:   Left Eye Near:    Bilateral Near:     Physical Exam Vitals reviewed.  Constitutional:      General: She is awake. She is not in acute distress.    Appearance: Normal appearance. She is well-developed. She is not ill-appearing.     Comments: Very pleasant female appears stated age in no acute distress sitting comfortably in exam room  HENT:     Head: Normocephalic and atraumatic.  Cardiovascular:     Rate and Rhythm: Normal rate and regular rhythm.     Heart sounds: Normal heart sounds, S1 normal and S2  normal. No murmur heard. Pulmonary:     Effort: Pulmonary effort is normal.     Breath sounds: Normal breath sounds.  No wheezing, rhonchi or rales.     Comments: Clear to auscultation bilaterally Musculoskeletal:     Cervical back: No tenderness or bony tenderness.     Thoracic back: No tenderness or bony tenderness.     Lumbar back: Tenderness and bony tenderness present. Decreased range of motion. Positive left straight leg raise test. Negative right straight leg raise test.     Comments: Back: Pain percussion of lumbar vertebrae.  Tenderness palpation over left lumbar paraspinal muscles.  Strength 5/5 bilateral lower extremities.  Positive straight leg raise on left.  Positive Faber on left.  Psychiatric:        Behavior: Behavior is cooperative.      UC Treatments / Results  Labs (all labs ordered are listed, but only abnormal results are displayed) Labs Reviewed - No data to display  EKG   Radiology DG Lumbar Spine Complete  Result Date: 05/27/2022 CLINICAL DATA:  Fall injury with lumbar spine and sacroiliac joint pain. EXAM: LUMBAR SPINE - COMPLETE 4+ VIEW; BILATERAL SACROILIAC JOINTS - 3+ VIEW COMPARISON:  CT scan abdomen and pelvis and reconstructions dated 03/10/2022. FINDINGS: Lumbar spine: Fine detail is limited due to overlying soft tissues. There is no obvious lumbar spine fracture. There is mild levo rotary scoliosis, apex L3, unchanged. No AP listhesis is seen. There is early facet spurring again at the lowest 2 levels and early anterior marginal endplate spurring Z6-13-5. Intervertebral disc spaces are maintained. Bilateral SI joints, 2 views: Fine detail is limited due to overlying soft tissues. No obvious fracture is seen or diastasis. Both SI joints are patent with mild spurring and mild features of chronic bilateral sacroiliitis, greater on the right. There are enthesopathic changes of the pelvic wings. IMPRESSION: Limited image detail as described above. No convincing  findings of lumbar spine or SI joint fracture. Mild degenerative change. Mild chronic sacroiliitis right-greater-than-left. Comparison to the prior study reveals no significant interval change Electronically Signed   By: Almira BarKeith  Chesser M.D.   On: 05/27/2022 20:05   DG Si Joints  Result Date: 05/27/2022 CLINICAL DATA:  Fall injury with lumbar spine and sacroiliac joint pain. EXAM: LUMBAR SPINE - COMPLETE 4+ VIEW; BILATERAL SACROILIAC JOINTS - 3+ VIEW COMPARISON:  CT scan abdomen and pelvis and reconstructions dated 03/10/2022. FINDINGS: Lumbar spine: Fine detail is limited due to overlying soft tissues. There is no obvious lumbar spine fracture. There is mild levo rotary scoliosis, apex L3, unchanged. No AP listhesis is seen. There is early facet spurring again at the lowest 2 levels and early anterior marginal endplate spurring W9-63-5. Intervertebral disc spaces are maintained. Bilateral SI joints, 2 views: Fine detail is limited due to overlying soft tissues. No obvious fracture is seen or diastasis. Both SI joints are patent with mild spurring and mild features of chronic bilateral sacroiliitis, greater on the right. There are enthesopathic changes of the pelvic wings. IMPRESSION: Limited image detail as described above. No convincing findings of lumbar spine or SI joint fracture. Mild degenerative change. Mild chronic sacroiliitis right-greater-than-left. Comparison to the prior study reveals no significant interval change Electronically Signed   By: Almira BarKeith  Chesser M.D.   On: 05/27/2022 20:05    Procedures Procedures (including critical care time)  Medications Ordered in UC Medications  acetaminophen (TYLENOL) tablet 975 mg (975 mg Oral Given 05/27/22 1857)  ketorolac (TORADOL) 30 MG/ML injection 30 mg (30 mg Intramuscular Given 05/27/22 1857)    Initial Impression / Assessment and Plan / UC Course  I have reviewed the  triage vital signs and the nursing notes.  Pertinent labs & imaging results that  were available during my care of the patient were reviewed by me and considered in my medical decision making (see chart for details).     X-rays obtained given mechanism of injury showed degenerative changes without acute findings.  Patient was given Toradol and Tylenol in clinic today with minimal improvement of symptoms.  Discussed that symptoms are most likely can related to a contusion.  Recommended over-the-counter medication for symptom management.  She was given Robaxin for symptom relief.  Patient requested additional pain medication due to severity of pain.  She has a history of seizures and cannot take tramadol.  She was given 3 doses of hydrocodone.  Discussed that this is sedating and she should not drive or drink alcohol taking it.  She did have history of hydromorphone prescription in [place] controlled substance database but does not have an active available prescription of this medicine.  Discussed that this is sedating and she should not drive or drink alcohol with taking this.  Recommend that she follow-up with sports medicine and was given contact information for local provider with instruction to call to schedule an appointment.  Discussed that if she has any lower extremity weakness, saddle anesthesia, bowel/urine incontinence, increasing pain she needs to go to the emergency room immediately to which she expressed understanding.  Final Clinical Impressions(s) / UC Diagnoses   Final diagnoses:  Fall, initial encounter  Acute left-sided low back pain without sciatica     Discharge Instructions      Your x-rays showed arthritis/degeneration but did not show any acute fractures which is great news.  Continue over-the-counter medications as needed.  I have called in a muscle relaxer known as Robaxin.  This will make you sleepy so do not drive or drink alcohol with taking it.  Use heat and gentle stretch for symptom relief.  Follow-up with sports medicine; call to schedule an  appointment.  If anything worsens please go to the emergency room as we discussed.  You should also follow-up with occupational health as they manage Worker's Comp. claims.     ED Prescriptions     Medication Sig Dispense Auth. Provider   methocarbamol (ROBAXIN) 500 MG tablet Take 1 tablet (500 mg total) by mouth every 8 (eight) hours as needed for muscle spasms. 21 tablet Shaheen Star K, PA-C   HYDROcodone-acetaminophen (NORCO/VICODIN) 5-325 MG tablet Take 1 tablet by mouth at bedtime as needed for up to 3 days. 3 tablet Keelen Quevedo K, PA-C      I have reviewed the PDMP during this encounter.   Jeani Hawking, PA-C 05/27/22 2047

## 2022-05-27 NOTE — Discharge Instructions (Addendum)
Your x-rays showed arthritis/degeneration but did not show any acute fractures which is great news.  Continue over-the-counter medications as needed.  I have called in a muscle relaxer known as Robaxin.  This will make you sleepy so do not drive or drink alcohol with taking it.  Use heat and gentle stretch for symptom relief.  Follow-up with sports medicine; call to schedule an appointment.  If anything worsens please go to the emergency room as we discussed.  You should also follow-up with occupational health as they manage Worker's Comp. claims.

## 2022-05-27 NOTE — ED Triage Notes (Signed)
Pt is present today with c/o lower back pain from slip and fall at work. Pt states that she did hit her head but denies LOC. Pt states that she fell today

## 2022-05-28 ENCOUNTER — Telehealth: Payer: Self-pay

## 2022-05-28 NOTE — Telephone Encounter (Signed)
Patient states she has not passed the kidney stone, she is still in a lot of pain and is out of her pain meds.  [place] and is requesting a refill of her pain meds.

## 2022-05-28 NOTE — Telephone Encounter (Signed)
Patient calling back needing a refill on:  tamsulosin (FLOMAX) 0.4 MG CAPS capsule  Hydromorphone 4mg  - take 1 every 4 hours as needed (not seeing this on pt chart)  Thanks, 

## 2022-05-29 NOTE — Telephone Encounter (Signed)
Patient has called back to check if medications have been called in for her.  Wants a call back at [phone]  Thanks, [name]

## 2022-05-29 NOTE — Telephone Encounter (Signed)
Patient aware we will refill her flomax but not the dilaudid.  Also informed patient that per Sharee Pimple if her pain is that severe she should go to the ED for imaging and if appropriate they can fill that rx for her.  Patient voiced understanding.

## 2022-07-19 IMAGING — DX DG LUMBAR SPINE COMPLETE 4+V
5 series · 5 of 5 positions shown · non-contrast
Comparison: CT scan abdomen and pelvis and reconstructions dated
03/10/2022.

CLINICAL DATA: Fall injury with lumbar spine and sacroiliac joint
pain.

EXAM:
LUMBAR SPINE - COMPLETE 4+ VIEW; BILATERAL SACROILIAC JOINTS - 3+
VIEW

[l-spine ap]
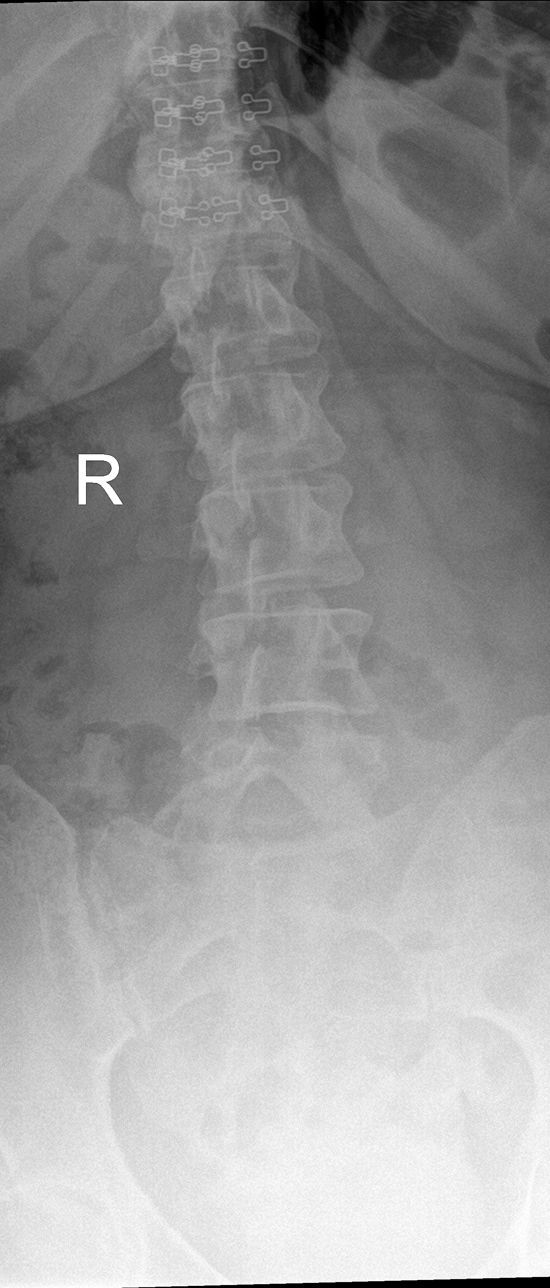

[l-spine obl (1 of 2)]
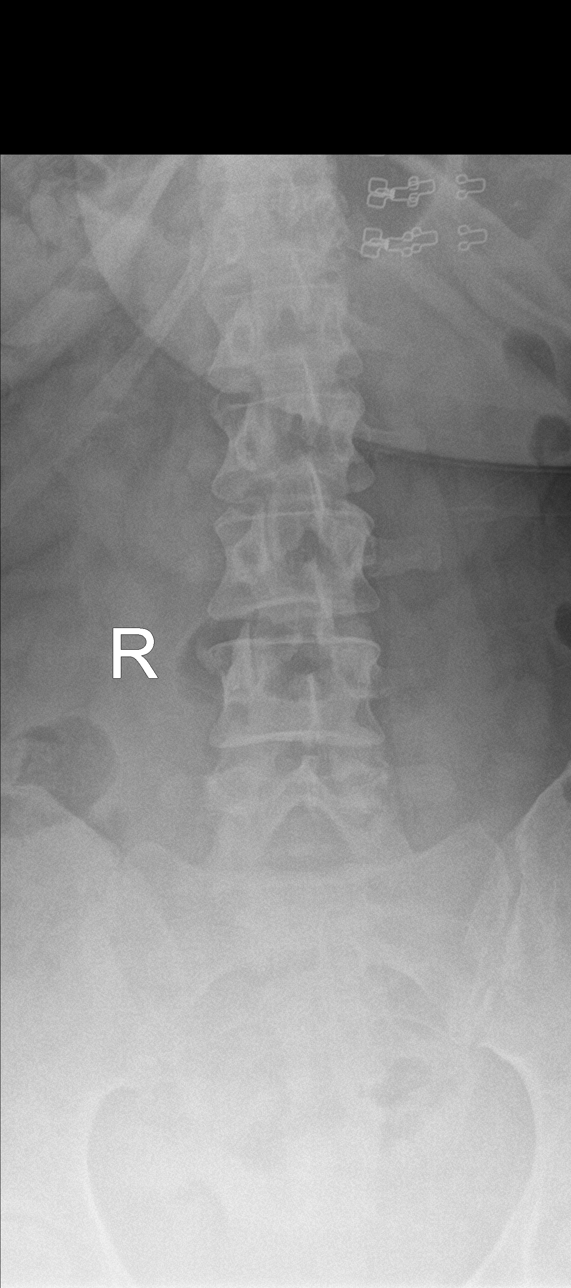

[l-spine obl (2 of 2)]
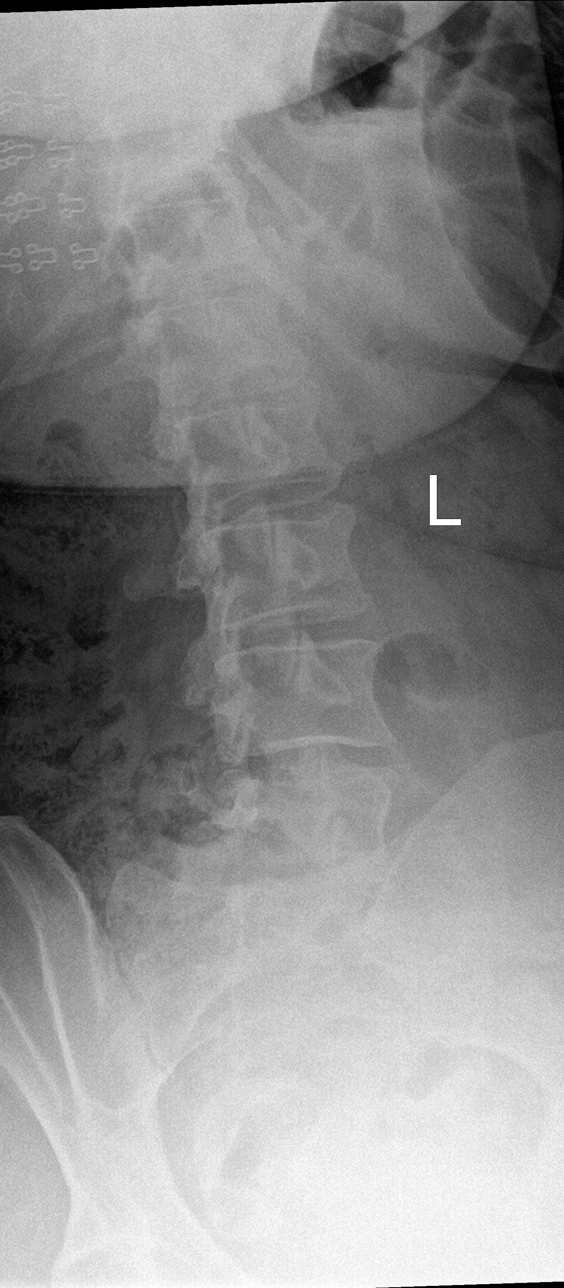

[l-spine lat (1 of 2)]
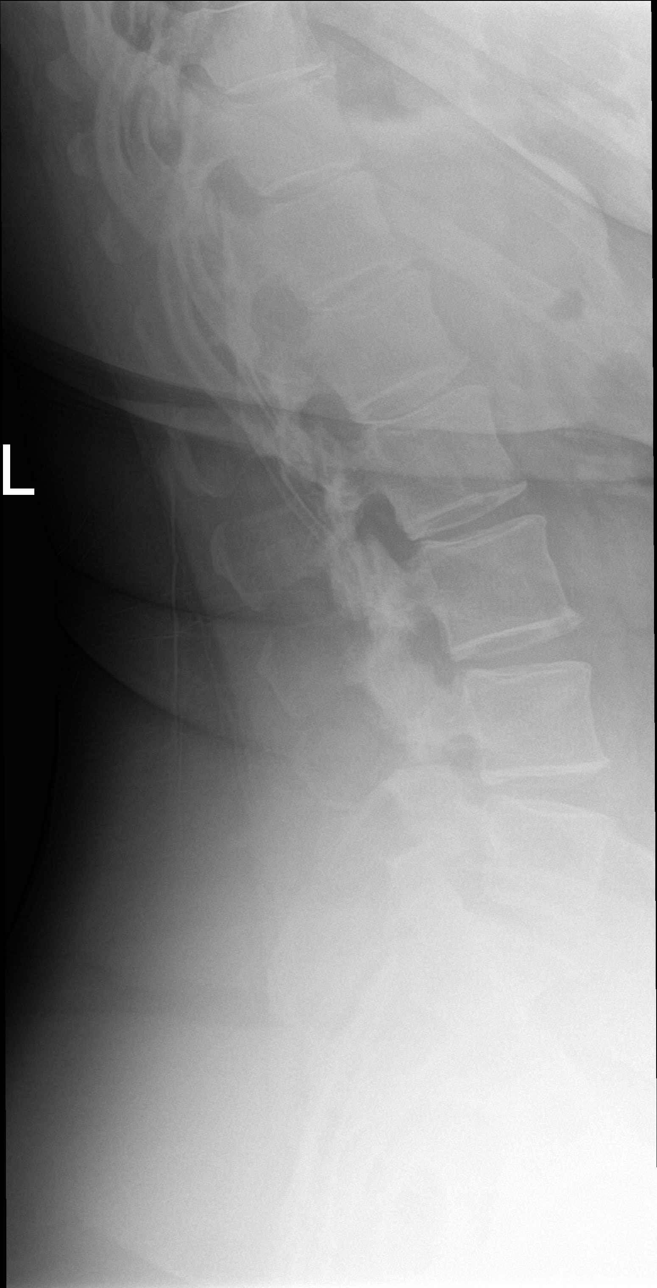

[l-spine lat (2 of 2)]
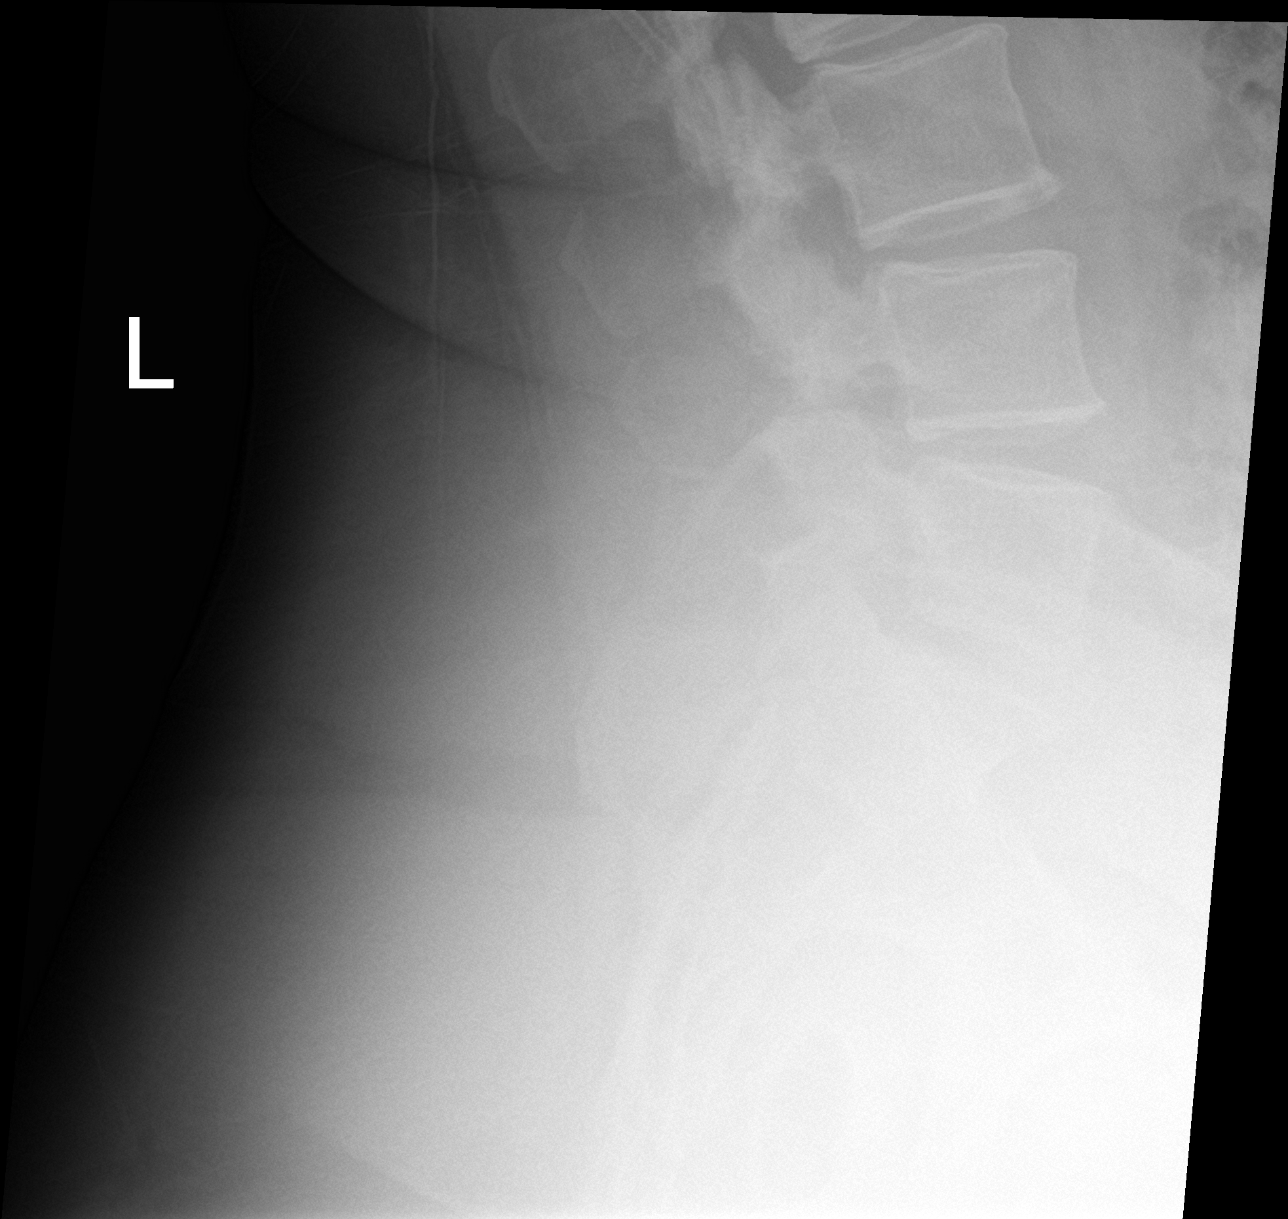

[5 of 5 positions shown; findings below may reference images not displayed]

FINDINGS: Lumbar spine:

Fine detail is limited due to overlying soft tissues. There is no
obvious lumbar spine fracture. There is mild levo rotary scoliosis,
apex L3, unchanged. No AP listhesis is seen.

There is early facet spurring again at the lowest 2 levels and early
anterior marginal endplate spurring L3-5. Intervertebral disc
spaces are maintained.

Bilateral SI joints, 2 views:

Fine detail is limited due to overlying soft tissues. No obvious
fracture is seen or diastasis. Both SI joints are patent with mild
spurring and mild features of chronic bilateral sacroiliitis,
greater on the right. There are enthesopathic changes of the pelvic
wings.
IMPRESSION: Limited image detail as described above. No convincing findings of
lumbar spine or SI joint fracture. Mild degenerative change. Mild
chronic sacroiliitis right-greater-than-left.

Comparison to the prior study reveals no significant interval change

## 2022-08-22 ENCOUNTER — Encounter (HOSPITAL_COMMUNITY): Payer: Self-pay | Admitting: Emergency Medicine

## 2022-08-22 ENCOUNTER — Emergency Department (HOSPITAL_COMMUNITY): Payer: BC Managed Care – PPO

## 2022-08-22 ENCOUNTER — Emergency Department (HOSPITAL_COMMUNITY)
Admission: EM | Admit: 2022-08-22 | Discharge: 2022-08-22 | Disposition: A | Discharge status: Home / Self Care | Payer: BC Managed Care – PPO | Attending: Emergency Medicine | Admitting: Emergency Medicine

## 2022-08-22 ENCOUNTER — Other Ambulatory Visit: Payer: Self-pay

## 2022-08-22 DIAGNOSIS — N9489 Other specified conditions associated with female genital organs and menstrual cycle: Secondary | ICD-10-CM | POA: Diagnosis not present

## 2022-08-22 DIAGNOSIS — O0000 Abdominal pregnancy without intrauterine pregnancy: Secondary | ICD-10-CM | POA: Diagnosis present

## 2022-08-22 DIAGNOSIS — R1084 Generalized abdominal pain: Secondary | ICD-10-CM | POA: Insufficient documentation

## 2022-08-22 LAB — COMPREHENSIVE METABOLIC PANEL
ALT: 27 U/L (ref 0–44)
AST: 21 U/L (ref 15–41)
Albumin: 3.4 g/dL — ABNORMAL LOW (ref 3.5–5.0)
Alkaline Phosphatase: 67 U/L (ref 38–126)
Anion gap: 4 — ABNORMAL LOW (ref 5–15)
BUN: 17 mg/dL (ref 6–20)
CO2: 21 mmol/L — ABNORMAL LOW (ref 22–32)
Calcium: 8.8 mg/dL — ABNORMAL LOW (ref 8.9–10.3)
Chloride: 113 mmol/L — ABNORMAL HIGH (ref 98–111)
Creatinine, Ser: 0.97 mg/dL (ref 0.44–1.00)
GFR, Estimated: >60 mL/min (ref >60)
Glucose, Bld: 90 mg/dL (ref 70–99)
Potassium: 3.4 mmol/L — ABNORMAL LOW (ref 3.5–5.1)
Sodium: 138 mmol/L (ref 135–145)
Total Bilirubin: 0.6 mg/dL (ref 0.3–1.2)
Total Protein: 7.2 g/dL (ref 6.5–8.1)

## 2022-08-22 LAB — TROPONIN I (HIGH SENSITIVITY)
Troponin I (High Sensitivity): 2 ng/L (ref <18)
Troponin I (High Sensitivity): 3 ng/L (ref <18)

## 2022-08-22 LAB — URINALYSIS, ROUTINE W REFLEX MICROSCOPIC
Bilirubin Urine: NEGATIVE
Glucose, UA: NEGATIVE mg/dL
Hgb urine dipstick: NEGATIVE
Ketones, ur: 5 mg/dL — AB
Leukocytes,Ua: NEGATIVE
Nitrite: NEGATIVE
Protein, ur: NEGATIVE mg/dL
Specific Gravity, Urine: 1.021 (ref 1.005–1.030)
pH: 7 (ref 5.0–8.0)

## 2022-08-22 LAB — CBC
HCT: 44 % (ref 36.0–46.0)
Hemoglobin: 14.2 g/dL (ref 12.0–15.0)
MCH: 28.7 pg (ref 26.0–34.0)
MCHC: 32.3 g/dL (ref 30.0–36.0)
MCV: 89.1 fL (ref 80.0–100.0)
Platelets: 286 10*3/uL (ref 150–400)
RBC: 4.94 MIL/uL (ref 3.87–5.11)
RDW: 13.9 % (ref 11.5–15.5)
WBC: 6.9 10*3/uL (ref 4.0–10.5)
nRBC: 0 % (ref 0.0–0.2)

## 2022-08-22 LAB — HCG, QUANTITATIVE, PREGNANCY: hCG, Beta Chain, Quant, S: 202 m[IU]/mL — ABNORMAL HIGH (ref <5)

## 2022-08-22 LAB — LIPASE, BLOOD: Lipase: 38 U/L (ref 11–51)

## 2022-08-22 LAB — PREGNANCY, URINE: Preg Test, Ur: POSITIVE — AB

## 2022-08-22 MED ORDER — HYDROMORPHONE HCL 1 MG/ML IJ SOLN
1.0000 mg | Freq: Once | INTRAMUSCULAR | Status: DC
  Filled 2022-08-22: qty 1

## 2022-08-22 MED ORDER — ONDANSETRON HCL 4 MG/2ML IJ SOLN
4.0000 mg | Freq: Once | INTRAMUSCULAR | Status: AC
  Administered 2022-08-22: 4 mg via INTRAVENOUS
  Filled 2022-08-22: qty 2

## 2022-08-22 MED ORDER — LIDOCAINE VISCOUS HCL 2 % MT SOLN
15.0000 mL | Freq: Once | OROMUCOSAL | Status: AC
  Administered 2022-08-22: 15 mL via ORAL
  Filled 2022-08-22: qty 15

## 2022-08-22 MED ORDER — ALUM & MAG HYDROXIDE-SIMETH 200-200-20 MG/5ML PO SUSP
30.0000 mL | Freq: Once | ORAL | Status: AC
  Administered 2022-08-22: 30 mL via ORAL
  Filled 2022-08-22: qty 30

## 2022-08-22 MED ORDER — LACTATED RINGERS IV BOLUS
1000.0000 mL | Freq: Once | INTRAVENOUS | Status: AC
  Administered 2022-08-22: 1000 mL via INTRAVENOUS

## 2022-08-22 MED ORDER — LIDOCAINE VISCOUS HCL 2 % MT SOLN
15.0000 mL | Freq: Once | OROMUCOSAL | Status: AC
Start: 2022-08-22 — End: 2022-08-22
  Administered 2022-08-22: 15 mL via ORAL
  Filled 2022-08-22: qty 15

## 2022-08-22 MED ORDER — ONDANSETRON 4 MG PO TBDP: 4.0000 mg | ORAL_TABLET | Freq: Three times a day (TID) | ORAL | 0 refills | Status: DC | PRN

## 2022-08-22 MED ORDER — CALCIUM CARBONATE ANTACID 600 MG PO CHEW: 600.0000 mg | CHEWABLE_TABLET | Freq: Two times a day (BID) | ORAL | 0 refills | Status: DC | PRN

## 2022-08-22 MED ORDER — PANTOPRAZOLE SODIUM 40 MG PO TBEC: 40.0000 mg | DELAYED_RELEASE_TABLET | Freq: Every day | ORAL | 0 refills | Status: DC

## 2022-08-22 MED ORDER — MORPHINE SULFATE (PF) 4 MG/ML IV SOLN
4.0000 mg | Freq: Once | INTRAVENOUS | Status: AC
  Administered 2022-08-22: 4 mg via INTRAVENOUS
  Filled 2022-08-22: qty 1

## 2022-08-22 MED ORDER — PANTOPRAZOLE SODIUM 40 MG PO TBEC
40.0000 mg | DELAYED_RELEASE_TABLET | Freq: Once | ORAL | Status: AC
  Administered 2022-08-22: 40 mg via ORAL
  Filled 2022-08-22: qty 1

## 2022-08-22 MED ORDER — DICYCLOMINE HCL 20 MG PO TABS: 20.0000 mg | ORAL_TABLET | Freq: Two times a day (BID) | ORAL | 0 refills | Status: DC

## 2022-08-22 NOTE — ED Triage Notes (Signed)
Pt presents with epigastric abdominal pain that started this am with N/V/D.

## 2022-08-22 NOTE — Discharge Instructions (Signed)
Your work-up today was overall reassuring.  It did show that you likely pregnant.  Ultrasound done today did not show intrauterine pregnancy and likely because it is too early.  If you have worsening vaginal bleeding please return for evaluation as this could be concerning for ectopic pregnancy.  I have spoke with the on-call OB recommends calling his clinic tomorrow to schedule an appointment for Monday to have a repeat hCG count performed.  For any worse abdominal pain return to the emergency room for evaluation otherwise follow-up with your primary care provider.  I have sent several medications into the pharmacy for you to help with managing your symptoms.

## 2022-08-22 NOTE — ED Provider Notes (Signed)
[facility] EMERGENCY DEPARTMENT Provider Note   CSN: [id number] Arrival date & time: 08/22/22  1615     History  Chief Complaint  Patient presents with   Abdominal Pain    Angelica Crawford is a 36 y.o. female.  36 year old female presents today for evaluation of abdominal pain, chest pain, nausea, vomiting.  States pain started this morning and was located substernally.  Initially described as reflux then progressed to sharp and stabbing and radiated to the right side of her chest.  She states since then she has vomited twice, has abdominal discomfort.  Denies fever, chills, hematemesis, blood in stool.  States for the past several days whenever she ate food she would have diarrhea.  States pain is intermittently severe.  Denies shortness of breath, lightheadedness, or palpitations.  The history is provided by the patient. No language interpreter was used.       Home Medications Prior to Admission medications   Medication Sig Start Date End Date Taking? Authorizing Provider  SUMAtriptan (IMITREX) 100 MG tablet Take 100 mg by mouth every 2 (two) hours as needed for migraine.  08/25/19 11/29/19  [provider]  alfuzosin (UROXATRAL) 10 MG 24 hr tablet TAKE 1 TABLET(10 MG) BY MOUTH DAILY WITH BREAKFAST 05/02/22   McKenzie, Mardene Celeste, MD  buPROPion (WELLBUTRIN XL) 150 MG 24 hr tablet Take 150 mg by mouth every morning. 06/12/20   [provider]  EPINEPHrine 0.3 mg/0.3 mL IJ SOAJ injection Inject 0.3 mg into the muscle as needed for anaphylaxis.  06/12/20   [provider]  FLUoxetine (PROZAC) 40 MG capsule Take 80 mg by mouth daily.  08/15/18   [provider]  LORazepam (ATIVAN) 0.5 MG tablet Take 0.5 mg by mouth daily as needed for anxiety.  Patient not taking: Reported on 03/11/2022 10/21/19   [provider]  methocarbamol (ROBAXIN) 500 MG tablet Take 1 tablet (500 mg total) by mouth every 8 (eight) hours as needed for muscle spasms. 05/27/22    Raspet, Erin K, PA-C  ondansetron (ZOFRAN) 4 MG tablet Take 1 tablet (4 mg total) by mouth every 6 (six) hours. 03/19/22   McKenzie, Mardene Celeste, MD  phentermine 37.5 MG capsule Take 37.5 mg by mouth every morning. Patient not taking: Reported on 03/11/2022    [provider]  tamsulosin (FLOMAX) 0.4 MG CAPS capsule TAKE 1 CAPSULE(0.4 MG) BY MOUTH DAILY FOR 14 DAYS 05/02/22   McKenzie, Mardene Celeste, MD  topiramate (TOPAMAX) 100 MG tablet Take 100 mg by mouth daily. 12/19/21   [provider]  zolpidem (AMBIEN) 10 MG tablet Take 10 mg by mouth at bedtime. Patient not taking: Reported on 03/11/2022 07/26/21   [provider]      Allergies    Penicillins, Sulfa antibiotics, Gabapentin, and Salicylic acid    Review of Systems   Review of Systems  Constitutional:  Negative for chills and fever.  Respiratory:  Negative for shortness of breath.   Cardiovascular:  Positive for chest pain. Negative for palpitations and leg swelling.  Gastrointestinal:  Positive for abdominal pain, diarrhea, nausea and vomiting. Negative for blood in stool.  Genitourinary:  Negative for dysuria, flank pain, pelvic pain and vaginal discharge.  Musculoskeletal:  Negative for back pain.  Neurological:  Negative for weakness and light-headedness.  All other systems reviewed and are negative.   Physical Exam Updated Vital Signs BP 137/89   Pulse 88   Temp 98.6 F (37 C) (Oral)   Resp 20  Ht 5\' 5"  (1.651 m)   Wt 109.8 kg   LMP 08/15/2022 (Approximate)   SpO2 100%   BMI 40.27 kg/m  Physical Exam Vitals and nursing note reviewed.  Constitutional:      General: She is not in acute distress.    Appearance: Normal appearance. She is not ill-appearing.  HENT:     Head: Normocephalic and atraumatic.     Nose: Nose normal.  Eyes:     General: No scleral icterus.    Extraocular Movements: Extraocular movements intact.     Conjunctiva/sclera: Conjunctivae normal.  Cardiovascular:     Rate and  Rhythm: Normal rate and regular rhythm.     Pulses: Normal pulses.  Pulmonary:     Effort: Pulmonary effort is normal. No respiratory distress.     Breath sounds: Normal breath sounds. No wheezing or rales.  Abdominal:     General: There is no distension.     Palpations: Abdomen is soft.     Tenderness: There is abdominal tenderness (epigastric, Right upper quadrant). There is no right CVA tenderness, left CVA tenderness or guarding.  Musculoskeletal:        General: Normal range of motion.     Cervical back: Normal range of motion.     Right lower leg: No edema.     Left lower leg: No edema.  Skin:    General: Skin is warm and dry.  Neurological:     General: No focal deficit present.     Mental Status: She is alert and oriented to person, place, and time. Mental status is at baseline.     ED Results / Procedures / Treatments   Labs (all labs ordered are listed, but only abnormal results are displayed) Labs Reviewed  CBC  LIPASE, BLOOD  COMPREHENSIVE METABOLIC PANEL  URINALYSIS, ROUTINE W REFLEX MICROSCOPIC  PREGNANCY, URINE  TROPONIN I (HIGH SENSITIVITY)    EKG EKG Interpretation  Date/Time:  Thursday August 22 2022 17:15:42 EDT Ventricular Rate:  98 PR Interval:  130 QRS Duration: 80 QT Interval:  348 QTC Calculation: 444 R Axis:   45 Text Interpretation: Normal sinus rhythm Normal ECG No previous ECGs available Confirmed by [id number] (Vivien Rossetti) on 08/22/2022 6:13:32 PM  Radiology No results found.  Procedures Procedures    Medications Ordered in ED Medications  lactated ringers bolus 1,000 mL (has no administration in time range)  morphine (PF) 4 MG/ML injection 4 mg (has no administration in time range)  ondansetron (ZOFRAN) injection 4 mg (has no administration in time range)  alum & mag hydroxide-simeth (MAALOX/MYLANTA) 200-200-20 MG/5ML suspension 30 mL (has no administration in time range)    And  lidocaine (XYLOCAINE) 2 % viscous mouth  solution 15 mL (has no administration in time range)    ED Course/ Medical Decision Making/ A&P                           Medical Decision Making Amount and/or Complexity of Data Reviewed Labs: ordered. Radiology: ordered.  Risk OTC drugs. Prescription drug management.   Medical Decision Making / ED Course   This patient presents to the ED for concern of abdominal pain, this involves an extensive number of treatment options, and is a complaint that carries with it a high risk of complications and morbidity.  The differential diagnosis includes ACS, pneumonia, PE, pancreatitis, gastritis, acid reflux, acute cholecystitis  MDM: 36 year old female presents today for evaluation of abdominal pain, chest pain,  nausea, and vomiting.  See HPI for additional details.  Work-up today shows CBC without leukocytosis or anemia.  UA without evidence of UTI.  CMP without acute concerning findings.  hCG elevated to 202.  GERD  pelvic ultrasound and without concerning findings.  Given vaginal spotting Case discussed with on-call OB Dr.Eure who will see patient in clinic on Monday for repeat hCG.  Strict return precautions discussed with patient on raise suspicion for acute pathology such as fever, worsening abdominal pain, vaginal bleeding, pelvic pain, persistent nausea vomiting to the point she is unable to keep any food or drink down.  She has tolerated p.o. intake in the emergency room.  Low suspicion for infectious process given afebrile, without tachycardia, and normal white count.  Findings were discussed with patient.  She voices understanding and is in agreement with plan.  Will provide patient with Maalox, Protonix, Bentyl, and Zofran.  Patient is appropriate for discharge.  Discharged in stable condition.  Case discussed with attending Dr. Elpidio Anis.  Lab Tests: -I ordered, reviewed, and interpreted labs.   The pertinent results include:   Labs Reviewed  COMPREHENSIVE METABOLIC PANEL - Abnormal;  Notable for the following components:      Result Value   Potassium 3.4 (*)    Chloride 113 (*)    CO2 21 (*)    Calcium 8.8 (*)    Albumin 3.4 (*)    Anion gap 4 (*)    All other components within normal limits  URINALYSIS, ROUTINE W REFLEX MICROSCOPIC - Abnormal; Notable for the following components:   APPearance TURBID (*)    Ketones, ur 5 (*)    Bacteria, UA RARE (*)    All other components within normal limits  PREGNANCY, URINE - Abnormal; Notable for the following components:   Preg Test, Ur POSITIVE (*)    All other components within normal limits  HCG, QUANTITATIVE, PREGNANCY - Abnormal; Notable for the following components:   hCG, Beta Chain, Quant, S 202 (*)    All other components within normal limits  LIPASE, BLOOD  CBC  TROPONIN I (HIGH SENSITIVITY)  TROPONIN I (HIGH SENSITIVITY)      EKG  EKG Interpretation  Date/Time:  Thursday August 22 2022 17:15:42 EDT Ventricular Rate:  98 PR Interval:  130 QRS Duration: 80 QT Interval:  348 QTC Calculation: 444 R Axis:   45 Text Interpretation: Normal sinus rhythm Normal ECG No previous ECGs available Confirmed by Vivien Rossetti ([id number]) on 08/22/2022 6:13:32 PM         Imaging Studies ordered: I ordered imaging studies including pelvic ultrasound, chest x-ray I independently visualized and interpreted imaging. I agree with the radiologist interpretation   Medicines ordered and prescription drug management: Meds ordered this encounter  Medications   lactated ringers bolus 1,000 mL   morphine (PF) 4 MG/ML injection 4 mg   ondansetron (ZOFRAN) injection 4 mg   AND Linked Order Group    alum & mag hydroxide-simeth (MAALOX/MYLANTA) 200-200-20 MG/5ML suspension 30 mL    lidocaine (XYLOCAINE) 2 % viscous mouth solution 15 mL   HYDROmorphone (DILAUDID) injection 1 mg   pantoprazole (PROTONIX) 40 MG tablet    Sig: Take 1 tablet (40 mg total) by mouth daily.    Dispense:  30 tablet    Refill:  0    Order  Specific Question:   Supervising Provider    Answer:   MILLER, BRIAN [[id number]]   dicyclomine (BENTYL) 20 MG tablet    Sig: Take 1  tablet (20 mg total) by mouth 2 (two) times daily.    Dispense:  20 tablet    Refill:  0    Order Specific Question:   Supervising Provider    Answer:   Hyacinth Meeker, BRIAN [[id number]]   Calcium Carbonate Antacid 600 MG chewable tablet    Sig: Chew 1 tablet (600 mg total) by mouth 2 (two) times daily as needed for heartburn.    Dispense:  90 tablet    Refill:  0    Order Specific Question:   Supervising Provider    Answer:   MILLER, BRIAN [[id number]]   ondansetron (ZOFRAN-ODT) 4 MG disintegrating tablet    Sig: Take 1 tablet (4 mg total) by mouth every 8 (eight) hours as needed for nausea or vomiting.    Dispense:  20 tablet    Refill:  0    Order Specific Question:   Supervising Provider    Answer:   Eber Hong [[id number]]   AND Linked Order Group    alum & mag hydroxide-simeth (MAALOX/MYLANTA) 200-200-20 MG/5ML suspension 30 mL    lidocaine (XYLOCAINE) 2 % viscous mouth solution 15 mL   pantoprazole (PROTONIX) EC tablet 40 mg    -I have reviewed the patients home medicines and have made adjustments as needed  Cardiac Monitoring: The patient was maintained on a cardiac monitor.  I personally viewed and interpreted the cardiac monitored which showed an underlying rhythm of: Normal sinus rhythm  Reevaluation: After the interventions noted above, I reevaluated the patient and found that they have :improved  Co morbidities that complicate the patient evaluation  Past Medical History:  Diagnosis Date   Back pain    Kidney stones    Seizures (HCC)       Dispostion: Patient is appropriate for discharge.  Discharged in stable condition.  Return precautions discussed.  Final Clinical Impression(s) / ED Diagnoses Final diagnoses:  Generalized abdominal pain  Abdominal pregnancy, unspecified whether intrauterine pregnancy present    Rx / DC Orders ED Discharge Orders           Ordered    pantoprazole (PROTONIX) 40 MG tablet  Daily        08/22/22 2327    dicyclomine (BENTYL) 20 MG tablet  2 times daily        08/22/22 2327    Calcium Carbonate Antacid 600 MG chewable tablet  2 times daily PRN        08/22/22 2327    ondansetron (ZOFRAN-ODT) 4 MG disintegrating tablet  Every 8 hours PRN        08/22/22 2327              Marita Kansas, PA-C 08/22/22 2344    Mardene Sayer, MD 08/23/22 [phone]

## 2022-08-22 NOTE — ED Notes (Signed)
EDP made aware of pt's pain and requested to speak with the doctor.

## 2022-08-22 NOTE — ED Notes (Signed)
Patient transported to Ultrasound 

## 2022-08-23 ENCOUNTER — Telehealth: Payer: Self-pay | Admitting: Adult Health

## 2022-08-23 NOTE — Telephone Encounter (Signed)
Pt aware to go to [facility] on Monday morning for repeat quant. Pt voiced understanding. JSY

## 2022-08-23 NOTE — Telephone Encounter (Signed)
Patient calling stating that she went to the ED yesterday and they stated that she needs a repeat HCG levels on Monday

## 2022-08-26 ENCOUNTER — Other Ambulatory Visit: Payer: BC Managed Care – PPO

## 2022-08-26 DIAGNOSIS — Z3490 Encounter for supervision of normal pregnancy, unspecified, unspecified trimester: Secondary | ICD-10-CM

## 2022-08-27 ENCOUNTER — Other Ambulatory Visit: Payer: Self-pay | Admitting: Adult Health

## 2022-08-27 DIAGNOSIS — Z3490 Encounter for supervision of normal pregnancy, unspecified, unspecified trimester: Secondary | ICD-10-CM

## 2022-08-27 LAB — BETA HCG QUANT (REF LAB): hCG Quant: 838 m[IU]/mL

## 2022-08-27 NOTE — Progress Notes (Signed)
Ck QHCG 08/28/22

## 2022-08-28 ENCOUNTER — Ambulatory Visit
Admission: EM | Admit: 2022-08-28 | Discharge: 2022-08-28 | Disposition: A | Discharge status: Home / Self Care | Payer: BC Managed Care – PPO | Attending: Family Medicine | Admitting: Family Medicine

## 2022-08-28 ENCOUNTER — Encounter: Payer: Self-pay | Admitting: Emergency Medicine

## 2022-08-28 DIAGNOSIS — N390 Urinary tract infection, site not specified: Secondary | ICD-10-CM | POA: Insufficient documentation

## 2022-08-28 LAB — POCT URINALYSIS DIP (MANUAL ENTRY)
Glucose, UA: NEGATIVE mg/dL
Ketones, POC UA: NEGATIVE mg/dL
Nitrite, UA: NEGATIVE
Protein Ur, POC: 100 mg/dL — AB
Spec Grav, UA: >=1.03 — AB (ref 1.010–1.025)
Urobilinogen, UA: 1 E.U./dL
pH, UA: 6 (ref 5.0–8.0)

## 2022-08-28 MED ORDER — NITROFURANTOIN MONOHYD MACRO 100 MG PO CAPS: 100.0000 mg | ORAL_CAPSULE | Freq: Two times a day (BID) | ORAL | 0 refills | Status: DC

## 2022-08-28 NOTE — ED Provider Notes (Signed)
[facility]    CSN: [id number] Arrival date & time: 08/28/22  1806      History   Chief Complaint No chief complaint on file.   HPI Angelica Crawford is a 36 y.o. female.   Patient presenting today with 1 day history of dysuria, urinary frequency.  Denies fever, chills, abdominal pain, nausea, vomiting, vaginal symptoms.  So far not trying anything over-the-counter for symptoms.  Frequent history of urinary tract infections that have felt similar.  Of note, patient is first trimester pregnancy.    Past Medical History:  Diagnosis Date   Back pain    Kidney stones    Seizures (HCC)     There are no problems to display for this patient.   Past Surgical History:  Procedure Laterality Date   EXTRACORPOREAL SHOCK WAVE LITHOTRIPSY Right 03/19/2022   Procedure: EXTRACORPOREAL SHOCK WAVE LITHOTRIPSY (ESWL);  Surgeon: Malen Gauze, MD;  Location: [facility];  Service: Urology;  Laterality: Right;    OB History     Gravida  1   Para  0   Term  0   Preterm  0   AB  0   Living  0      SAB  0   IAB  0   Ectopic  0   Multiple  0   Live Births  0            Home Medications    Prior to Admission medications   Medication Sig Start Date End Date Taking? Authorizing Provider  nitrofurantoin, macrocrystal-monohydrate, (MACROBID) 100 MG capsule Take 1 capsule (100 mg total) by mouth 2 (two) times daily. 08/28/22  Yes Particia Nearing, PA-C  SUMAtriptan (IMITREX) 100 MG tablet Take 100 mg by mouth every 2 (two) hours as needed for migraine.  08/25/19 11/29/19  [provider]  alfuzosin (UROXATRAL) 10 MG 24 hr tablet TAKE 1 TABLET(10 MG) BY MOUTH DAILY WITH BREAKFAST 05/02/22   McKenzie, Mardene Celeste, MD  buPROPion (WELLBUTRIN XL) 150 MG 24 hr tablet Take 150 mg by mouth every morning. 06/12/20   [provider]  Calcium Carbonate Antacid 600 MG chewable tablet Chew 1 tablet (600 mg total) by mouth 2 (two) times daily as needed for  heartburn. 08/22/22   Marita Kansas, PA-C  dicyclomine (BENTYL) 20 MG tablet Take 1 tablet (20 mg total) by mouth 2 (two) times daily. 08/22/22   Marita Kansas, PA-C  EPINEPHrine 0.3 mg/0.3 mL IJ SOAJ injection Inject 0.3 mg into the muscle as needed for anaphylaxis.  06/12/20   [provider]  FLUoxetine (PROZAC) 40 MG capsule Take 80 mg by mouth daily.  08/15/18   [provider]  LORazepam (ATIVAN) 0.5 MG tablet Take 0.5 mg by mouth daily as needed for anxiety.  Patient not taking: Reported on 03/11/2022 10/21/19   [provider]  methocarbamol (ROBAXIN) 500 MG tablet Take 1 tablet (500 mg total) by mouth every 8 (eight) hours as needed for muscle spasms. 05/27/22   Raspet, Erin K, PA-C  ondansetron (ZOFRAN) 4 MG tablet Take 1 tablet (4 mg total) by mouth every 6 (six) hours. 03/19/22   McKenzie, Mardene Celeste, MD  ondansetron (ZOFRAN-ODT) 4 MG disintegrating tablet Take 1 tablet (4 mg total) by mouth every 8 (eight) hours as needed for nausea or vomiting. 08/22/22   Karie Mainland, Amjad, PA-C  pantoprazole (PROTONIX) 40 MG tablet Take 1 tablet (40 mg total) by mouth daily. 08/22/22   Marita Kansas, PA-C  phentermine 37.5 MG capsule Take 37.5 mg by mouth every morning. Patient not taking: Reported on 03/11/2022    [provider]  tamsulosin (FLOMAX) 0.4 MG CAPS capsule TAKE 1 CAPSULE(0.4 MG) BY MOUTH DAILY FOR 14 DAYS 05/02/22   McKenzie, Mardene Celeste, MD  topiramate (TOPAMAX) 100 MG tablet Take 100 mg by mouth daily. 12/19/21   [provider]  zolpidem (AMBIEN) 10 MG tablet Take 10 mg by mouth at bedtime. Patient not taking: Reported on 03/11/2022 07/26/21   [provider]    Family History Family History  Problem Relation Age of Onset   Multiple sclerosis Mother    Cancer Mother    Breast cancer Mother     Social History Social History   Tobacco Use   Smoking status: Never   Smokeless tobacco: Never  Vaping Use   Vaping Use: Never used  Substance Use Topics    Alcohol use: Yes    Comment: occas   Drug use: No     Allergies   Penicillins, Sulfa antibiotics, Gabapentin, and Salicylic acid   Review of Systems Review of Systems Per HPI  Physical Exam Triage Vital Signs ED Triage Vitals  Enc Vitals Group     BP 08/28/22 1854 (!) 133/91     Pulse Rate 08/28/22 1854 100     Resp 08/28/22 1854 16     Temp 08/28/22 1854 97.7 F (36.5 C)     Temp Source 08/28/22 1854 Oral     SpO2 08/28/22 1854 98 %     Weight --      Height --      Head Circumference --      Peak Flow --      Pain Score 08/28/22 1855 5     Pain Loc --      Pain Edu? --      Excl. in GC? --    No data found.  Updated Vital Signs BP (!) 133/91 (BP Location: Right Arm)   Pulse 100   Temp 97.7 F (36.5 C) (Oral)   Resp 16   LMP 07/23/2022 (Approximate)   SpO2 98%   Visual Acuity Right Eye Distance:   Left Eye Distance:   Bilateral Distance:    Right Eye Near:   Left Eye Near:    Bilateral Near:     Physical Exam Vitals and nursing note reviewed.  Constitutional:      Appearance: Normal appearance. She is not ill-appearing.  HENT:     Head: Atraumatic.  Eyes:     Extraocular Movements: Extraocular movements intact.     Conjunctiva/sclera: Conjunctivae normal.  Cardiovascular:     Rate and Rhythm: Normal rate and regular rhythm.     Heart sounds: Normal heart sounds.  Pulmonary:     Effort: Pulmonary effort is normal.     Breath sounds: Normal breath sounds.  Abdominal:     General: Bowel sounds are normal. There is no distension.     Palpations: Abdomen is soft.     Tenderness: There is no abdominal tenderness. There is no right CVA tenderness, left CVA tenderness or guarding.  Musculoskeletal:        General: Normal range of motion.     Cervical back: Normal range of motion and neck supple.  Skin:    General: Skin is warm and dry.  Neurological:     Mental Status: She is alert and oriented to person, place, and time.  Psychiatric:  Mood and Affect: Mood normal.        Thought Content: Thought content normal.        Judgment: Judgment normal.      UC Treatments / Results  Labs (all labs ordered are listed, but only abnormal results are displayed) Labs Reviewed  POCT URINALYSIS DIP (MANUAL ENTRY) - Abnormal; Notable for the following components:      Result Value   Clarity, UA cloudy (*)    Bilirubin, UA small (*)    Spec Grav, UA >=1.030 (*)    Blood, UA large (*)    Protein Ur, POC =100 (*)    Leukocytes, UA Small (1+) (*)    All other components within normal limits  URINE CULTURE    EKG   Radiology No results found.  Procedures Procedures (including critical care time)  Medications Ordered in UC Medications - No data to display  Initial Impression / Assessment and Plan / UC Course  I have reviewed the triage vital signs and the nursing notes.  Pertinent labs & imaging results that were available during my care of the patient were reviewed by me and considered in my medical decision making (see chart for details).     Urinalysis with evidence of urinary tract infection, urine culture pending.  Treat with Macrobid while awaiting results and adjust if needed based on these.  Push fluids, return for worsening symptoms.  Final Clinical Impressions(s) / UC Diagnoses   Final diagnoses:  Acute lower UTI   Discharge Instructions   None    ED Prescriptions     Medication Sig Dispense Auth. Provider   nitrofurantoin, macrocrystal-monohydrate, (MACROBID) 100 MG capsule Take 1 capsule (100 mg total) by mouth 2 (two) times daily. 10 capsule Particia Nearing, [place]      PDMP not reviewed this encounter.   Particia Nearing, [place] 08/28/22 1915

## 2022-08-28 NOTE — ED Triage Notes (Signed)
Painful urination, urinary frequency that started today

## 2022-08-29 LAB — URINE CULTURE

## 2022-09-10 ENCOUNTER — Other Ambulatory Visit: Payer: Self-pay | Admitting: Obstetrics & Gynecology

## 2022-09-10 DIAGNOSIS — O3680X0 Pregnancy with inconclusive fetal viability, not applicable or unspecified: Secondary | ICD-10-CM

## 2022-09-10 LAB — BETA HCG QUANT (REF LAB): hCG Quant: 29044 m[IU]/mL

## 2022-09-12 ENCOUNTER — Ambulatory Visit (INDEPENDENT_AMBULATORY_CARE_PROVIDER_SITE_OTHER): Payer: BC Managed Care – PPO

## 2022-09-12 DIAGNOSIS — Z3A01 Less than 8 weeks gestation of pregnancy: Secondary | ICD-10-CM | POA: Diagnosis not present

## 2022-09-12 DIAGNOSIS — O3680X0 Pregnancy with inconclusive fetal viability, not applicable or unspecified: Secondary | ICD-10-CM

## 2022-09-12 NOTE — Progress Notes (Signed)
[place] 7+2 wks,single IUP with yolk sac,CRL 8.43 mm,FHR 127 bpm,normal ovaries

## 2022-09-30 ENCOUNTER — Encounter: Payer: Self-pay | Admitting: Advanced Practice Midwife

## 2022-10-08 ENCOUNTER — Emergency Department (HOSPITAL_COMMUNITY): Payer: BC Managed Care – PPO

## 2022-10-08 ENCOUNTER — Encounter (HOSPITAL_COMMUNITY): Payer: Self-pay | Admitting: Emergency Medicine

## 2022-10-08 ENCOUNTER — Other Ambulatory Visit: Payer: Self-pay

## 2022-10-08 ENCOUNTER — Emergency Department (HOSPITAL_COMMUNITY)
Admission: EM | Admit: 2022-10-08 | Discharge: 2022-10-08 | Disposition: A | Discharge status: Home / Self Care | Payer: BC Managed Care – PPO | Attending: Emergency Medicine | Admitting: Emergency Medicine

## 2022-10-08 DIAGNOSIS — R109 Unspecified abdominal pain: Secondary | ICD-10-CM

## 2022-10-08 DIAGNOSIS — O26891 Other specified pregnancy related conditions, first trimester: Secondary | ICD-10-CM | POA: Insufficient documentation

## 2022-10-08 DIAGNOSIS — Z3A11 11 weeks gestation of pregnancy: Secondary | ICD-10-CM | POA: Insufficient documentation

## 2022-10-08 LAB — URINALYSIS, ROUTINE W REFLEX MICROSCOPIC
Bilirubin Urine: NEGATIVE
Glucose, UA: NEGATIVE mg/dL
Hgb urine dipstick: NEGATIVE
Ketones, ur: NEGATIVE mg/dL
Leukocytes,Ua: NEGATIVE
Nitrite: NEGATIVE
Protein, ur: NEGATIVE mg/dL
Specific Gravity, Urine: 1.016 (ref 1.005–1.030)
pH: 6 (ref 5.0–8.0)

## 2022-10-08 LAB — BASIC METABOLIC PANEL
Anion gap: 9 (ref 5–15)
BUN: 14 mg/dL (ref 6–20)
CO2: 21 mmol/L — ABNORMAL LOW (ref 22–32)
Calcium: 8.9 mg/dL (ref 8.9–10.3)
Chloride: 105 mmol/L (ref 98–111)
Creatinine, Ser: 0.67 mg/dL (ref 0.44–1.00)
GFR, Estimated: >60 mL/min (ref >60)
Glucose, Bld: 86 mg/dL (ref 70–99)
Potassium: 3.7 mmol/L (ref 3.5–5.1)
Sodium: 135 mmol/L (ref 135–145)

## 2022-10-08 LAB — CBC WITH DIFFERENTIAL/PLATELET
Abs Immature Granulocytes: 0.02 10*3/uL (ref 0.00–0.07)
Basophils Absolute: 0 10*3/uL (ref 0.0–0.1)
Basophils Relative: 0 %
Eosinophils Absolute: 0.1 10*3/uL (ref 0.0–0.5)
Eosinophils Relative: 2 %
HCT: 39.9 % (ref 36.0–46.0)
Hemoglobin: 12.8 g/dL (ref 12.0–15.0)
Immature Granulocytes: 0 %
Lymphocytes Relative: 23 %
Lymphs Abs: 1.9 10*3/uL (ref 0.7–4.0)
MCH: 29.3 pg (ref 26.0–34.0)
MCHC: 32.1 g/dL (ref 30.0–36.0)
MCV: 91.3 fL (ref 80.0–100.0)
Monocytes Absolute: 1 10*3/uL (ref 0.1–1.0)
Monocytes Relative: 12 %
Neutro Abs: 5.3 10*3/uL (ref 1.7–7.7)
Neutrophils Relative %: 63 %
Platelets: 246 10*3/uL (ref 150–400)
RBC: 4.37 MIL/uL (ref 3.87–5.11)
RDW: 14.6 % (ref 11.5–15.5)
WBC: 8.3 10*3/uL (ref 4.0–10.5)
nRBC: 0 % (ref 0.0–0.2)

## 2022-10-08 LAB — HCG, QUANTITATIVE, PREGNANCY: hCG, Beta Chain, Quant, S: 61162 m[IU]/mL — ABNORMAL HIGH (ref <5)

## 2022-10-08 MED ORDER — METOCLOPRAMIDE HCL 5 MG/ML IJ SOLN
10.0000 mg | Freq: Once | INTRAMUSCULAR | Status: AC
  Administered 2022-10-08: 10 mg via INTRAVENOUS
  Filled 2022-10-08: qty 2

## 2022-10-08 MED ORDER — HYDROMORPHONE HCL 1 MG/ML IJ SOLN
1.0000 mg | Freq: Once | INTRAMUSCULAR | Status: AC
  Administered 2022-10-08: 1 mg via INTRAVENOUS
  Filled 2022-10-08: qty 1

## 2022-10-08 MED ORDER — HYDROMORPHONE HCL 1 MG/ML IJ SOLN
0.5000 mg | Freq: Once | INTRAMUSCULAR | Status: AC
  Administered 2022-10-08: 0.5 mg via INTRAVENOUS
  Filled 2022-10-08: qty 0.5

## 2022-10-08 MED ORDER — HYDROMORPHONE HCL 2 MG PO TABS: 2.0000 mg | ORAL_TABLET | Freq: Four times a day (QID) | ORAL | 0 refills | Status: DC | PRN

## 2022-10-08 NOTE — Discharge Instructions (Addendum)
Your symptoms suggest that you are passing a kidney stone although I suspect it is a very small 1 that is not picked up on today's ultrasound.  You are being prescribed a small quantity of pain medicine to help you with your symptoms.  Use caution with this medication as it will make you drowsy, do not drive within 4 hours of taking it.  Strain your urine that you will know if you have passed a stone.  You may need follow-up care with Dr. Alyson Ingles if your symptoms persist.  Although you are a little early in your pregnancy for this to be round ligament pain, this is possible, I have provided you with information about this condition.

## 2022-10-08 NOTE — ED Triage Notes (Signed)
Pt [redacted] weeks pregnant. Hx of stones. Pt c/o pain to left flank yesterday, got up this am and noticed pink colored d/c.pt now c/o "strong ovarian pain" with more pink d/c. Color wnl.

## 2022-10-08 NOTE — ED Provider Notes (Signed)
[facility] EMERGENCY DEPARTMENT Provider Note   CSN: [id number] Arrival date & time: 10/08/22  1322     History  Chief Complaint  Patient presents with   Flank Pain    Angelica Crawford is a 36 y.o. female with a history including chronic back pain, history of kidney stones, most recently passage of a stone about 3 months ago and who is also [redacted] weeks pregnant with her first pregnancy presenting with left flank pain which started yesterday.  She is describing intermittent sharp pain which started in the left flank and is now lower in her left pelvic region.  She denies dysuria.  She has however noticed a pink-tinged discharge with wiping.  She denies fevers or chills, denies nausea or vomiting, she has been urinating more frequently.  She does state that historically she has required Dilaudid tablets to control kidney stone pain.  She is under the care of Dr. Ronne Binning of urology.  The history is provided by the patient.       Home Medications Prior to Admission medications   Medication Sig Start Date End Date Taking? Authorizing Provider  HYDROmorphone (DILAUDID) 2 MG tablet Take 1 tablet (2 mg total) by mouth every 6 (six) hours as needed for severe pain. 10/08/22  Yes Siddhanth Denk, Raynelle Fanning, PA-C  SUMAtriptan (IMITREX) 100 MG tablet Take 100 mg by mouth every 2 (two) hours as needed for migraine.  08/25/19 11/29/19  [provider]  alfuzosin (UROXATRAL) 10 MG 24 hr tablet TAKE 1 TABLET(10 MG) BY MOUTH DAILY WITH BREAKFAST 05/02/22   McKenzie, Mardene Celeste, MD  buPROPion (WELLBUTRIN XL) 150 MG 24 hr tablet Take 150 mg by mouth every morning. 06/12/20   [provider]  Calcium Carbonate Antacid 600 MG chewable tablet Chew 1 tablet (600 mg total) by mouth 2 (two) times daily as needed for heartburn. 08/22/22   Marita Kansas, PA-C  dicyclomine (BENTYL) 20 MG tablet Take 1 tablet (20 mg total) by mouth 2 (two) times daily. 08/22/22   Marita Kansas, PA-C  EPINEPHrine 0.3 mg/0.3 mL IJ SOAJ  injection Inject 0.3 mg into the muscle as needed for anaphylaxis.  06/12/20   [provider]  FLUoxetine (PROZAC) 40 MG capsule Take 80 mg by mouth daily.  08/15/18   [provider]  LORazepam (ATIVAN) 0.5 MG tablet Take 0.5 mg by mouth daily as needed for anxiety.  Patient not taking: Reported on 03/11/2022 10/21/19   [provider]  methocarbamol (ROBAXIN) 500 MG tablet Take 1 tablet (500 mg total) by mouth every 8 (eight) hours as needed for muscle spasms. 05/27/22   Raspet, Noberto Retort, PA-C  nitrofurantoin, macrocrystal-monohydrate, (MACROBID) 100 MG capsule Take 1 capsule (100 mg total) by mouth 2 (two) times daily. 08/28/22   Particia Nearing, PA-C  ondansetron (ZOFRAN) 4 MG tablet Take 1 tablet (4 mg total) by mouth every 6 (six) hours. 03/19/22   McKenzie, Mardene Celeste, MD  ondansetron (ZOFRAN-ODT) 4 MG disintegrating tablet Take 1 tablet (4 mg total) by mouth every 8 (eight) hours as needed for nausea or vomiting. 08/22/22   Karie Mainland, Amjad, PA-C  pantoprazole (PROTONIX) 40 MG tablet Take 1 tablet (40 mg total) by mouth daily. 08/22/22   Marita Kansas, PA-C  phentermine 37.5 MG capsule Take 37.5 mg by mouth every morning. Patient not taking: Reported on 03/11/2022    [provider]  tamsulosin (FLOMAX) 0.4 MG CAPS capsule TAKE 1 CAPSULE(0.4 MG) BY MOUTH DAILY FOR 14 DAYS 05/02/22  McKenzie, Mardene Celeste, MD  topiramate (TOPAMAX) 100 MG tablet Take 100 mg by mouth daily. 12/19/21   [provider]  zolpidem (AMBIEN) 10 MG tablet Take 10 mg by mouth at bedtime. Patient not taking: Reported on 03/11/2022 07/26/21   [provider]      Allergies    Penicillins, Sulfa antibiotics, Gabapentin, and Salicylic acid    Review of Systems   Review of Systems  Constitutional:  Negative for chills and fever.  HENT: Negative.    Eyes: Negative.   Respiratory: Negative.    Gastrointestinal:  Negative for abdominal pain and nausea.  Genitourinary:  Positive for  dysuria, flank pain and vaginal discharge.  Skin: Negative.     Physical Exam Updated Vital Signs BP (!) 119/90   Pulse 84   Temp 98.1 F (36.7 C) (Oral)   Resp 17   LMP 07/23/2022 (Approximate)   SpO2 100%  Physical Exam Vitals and nursing note reviewed.  Constitutional:      Appearance: She is well-developed.  HENT:     Head: Normocephalic and atraumatic.  Eyes:     Conjunctiva/sclera: Conjunctivae normal.  Cardiovascular:     Rate and Rhythm: Normal rate.  Pulmonary:     Effort: Pulmonary effort is normal.     Breath sounds: No wheezing.  Abdominal:     General: Bowel sounds are normal.     Palpations: Abdomen is soft. There is no mass.     Tenderness: There is no abdominal tenderness. There is left CVA tenderness. There is no guarding.  Musculoskeletal:        General: Normal range of motion.     Cervical back: Normal range of motion.  Neurological:     General: No focal deficit present.     Mental Status: She is alert.  Psychiatric:        Mood and Affect: Mood normal.     ED Results / Procedures / Treatments   Labs (all labs ordered are listed, but only abnormal results are displayed) Labs Reviewed  BASIC METABOLIC PANEL - Abnormal; Notable for the following components:      Result Value   CO2 21 (*)    All other components within normal limits  URINALYSIS, ROUTINE W REFLEX MICROSCOPIC  CBC WITH DIFFERENTIAL/PLATELET  HCG, QUANTITATIVE, PREGNANCY    EKG None  Radiology US OB Comp < 14 Wks  Result Date: 10/08/2022 CLINICAL DATA:  vaginal spotting. Spotting. Left flank pain. Last menstrual period 07/23/2022. Estimated due date by last menstrual period 04/29/2023. Gestational age by last menstrual period 10 weeks and 3 days. EXAM: OBSTETRIC <14 WK ULTRASOUND TECHNIQUE: Transabdominal ultrasound was performed for evaluation of the gestation as well as the maternal uterus and adnexal regions. COMPARISON:  Ultrasound Ob 08/22/2022, ultrasound Ob 09/12/2022  FINDINGS: Intrauterine gestational sac: Single Yolk sac:  Not Visualized. Embryo:  Visualized. Cardiac Activity: Visualized. Heart Rate: 148 bpm CRL:   38 mm   10 w 5 d                  [place] EDC: 05/01/2023 Subchorionic hemorrhage:  None visualized. Maternal uterus/adnexae: Right ovary is unremarkable. The left ovary is not visualized. IMPRESSION: 1. Single live intrauterine pregnancy with gestational age by ultrasound of 10 weeks and 5 days concordant with gestational age by last menstrual period of 10 weeks and 3 days. 2. Left ovary not visualized on transabdominal only ultrasound. Electronically Signed   By: Tish Frederickson M.D.   On: 10/08/2022  18:41   US Renal  Result Date: 10/08/2022 CLINICAL DATA:  Eleven weeks pregnant, presenting with left flank pain x1 day. EXAM: RENAL / URINARY TRACT ULTRASOUND COMPLETE COMPARISON:  None Available. FINDINGS: Right Kidney: Renal measurements: 11.8 cm x 5.7 cm x 5.6 cm = volume: 195.43 mL. Echogenicity within normal limits. No mass or hydronephrosis visualized. Left Kidney: Renal measurements: 11.3 cm x 6.0 cm x 5.4 cm = volume: 193.22 mL. Echogenicity within normal limits. No mass or hydronephrosis visualized. Bladder: Appears normal for degree of bladder distention. The bilateral ureteral jets are visualized. Other: None. IMPRESSION: Normal renal ultrasound. Electronically Signed   By: Aram Candela M.D.   On: 10/08/2022 17:42    Procedures Procedures    Medications Ordered in ED Medications  HYDROmorphone (DILAUDID) injection 1 mg (1 mg Intravenous Given 10/08/22 1703)  metoCLOPramide (REGLAN) injection 10 mg (10 mg Intravenous Given 10/08/22 1702)  HYDROmorphone (DILAUDID) injection 0.5 mg (0.5 mg Intravenous Given 10/08/22 1903)    ED Course/ Medical Decision Making/ A&P                           Medical Decision Making Pt currently ll weeks pregnant with history significant for frequent kidney stones presenting with left flank pain which has  now migrated to her left pelvis region.  She has establish care with OB/GYN and has had a prior ultrasound confirming an IUP.  However she is describing some pink-tinged discharge with urination, therefore a pelvic ultrasound was also performed in addition to a renal ultrasound.  There is no obvious stone on this ultrasound however her symptoms suggest probable passage of either a stone small enough to be missed on ultrasound or she has already passed it and has residual renal colic.  She could also have round ligament pain, although she is a little early in her pregnancy for this, but also possible.  Her urinalysis is negative for infection.  She has been prescribed pain medication, asked to strain her urine and to follow-up with Dr. Ronne Binning if symptoms persist.  She has a follow-up appointment with OB/GYN in 3 weeks.  Amount and/or Complexity of Data Reviewed Labs: ordered.    Details: Normal labs today including normal creatinine function.  Normal urinalysis, there is no hemoglobin in her urine.  Her hCG Sharene Butters is also continuing to rise which is reassuring, today's result is 61,162. Radiology: ordered.    Details: Ultrasound results per above.  Risk Prescription drug management.           Final Clinical Impression(s) / ED Diagnoses Final diagnoses:  Flank pain    Rx / DC Orders ED Discharge Orders          Ordered    HYDROmorphone (DILAUDID) 2 MG tablet  Every 6 hours PRN        10/08/22 1904              Victoriano Lain 10/08/22 1921    Terald Sleeper, MD 10/09/22 [phone]

## 2022-10-28 ENCOUNTER — Ambulatory Visit (INDEPENDENT_AMBULATORY_CARE_PROVIDER_SITE_OTHER): Payer: BC Managed Care – PPO

## 2022-10-28 ENCOUNTER — Other Ambulatory Visit: Payer: Self-pay | Admitting: Obstetrics & Gynecology

## 2022-10-28 ENCOUNTER — Encounter: Payer: BC Managed Care – PPO | Admitting: Advanced Practice Midwife

## 2022-10-28 ENCOUNTER — Encounter: Payer: Self-pay | Admitting: Advanced Practice Midwife

## 2022-10-28 ENCOUNTER — Ambulatory Visit: Payer: BC Managed Care – PPO | Admitting: *Deleted

## 2022-10-28 ENCOUNTER — Ambulatory Visit (INDEPENDENT_AMBULATORY_CARE_PROVIDER_SITE_OTHER): Payer: BC Managed Care – PPO | Admitting: Advanced Practice Midwife

## 2022-10-28 ENCOUNTER — Other Ambulatory Visit (HOSPITAL_COMMUNITY)
Admission: RE | Admit: 2022-10-28 | Discharge: 2022-10-28 | Disposition: A | Discharge status: Home / Self Care | Payer: BC Managed Care – PPO | Source: Ambulatory Visit | Attending: Advanced Practice Midwife | Admitting: Advanced Practice Midwife

## 2022-10-28 VITALS — BP 123/84 | HR 77

## 2022-10-28 DIAGNOSIS — Z3A13 13 weeks gestation of pregnancy: Secondary | ICD-10-CM | POA: Insufficient documentation

## 2022-10-28 DIAGNOSIS — Z113 Encounter for screening for infections with a predominantly sexual mode of transmission: Secondary | ICD-10-CM | POA: Insufficient documentation

## 2022-10-28 DIAGNOSIS — Z3401 Encounter for supervision of normal first pregnancy, first trimester: Secondary | ICD-10-CM

## 2022-10-28 DIAGNOSIS — Z124 Encounter for screening for malignant neoplasm of cervix: Secondary | ICD-10-CM

## 2022-10-28 DIAGNOSIS — O0993 Supervision of high risk pregnancy, unspecified, third trimester: Secondary | ICD-10-CM | POA: Insufficient documentation

## 2022-10-28 DIAGNOSIS — Z6841 Body Mass Index (BMI) 40.0 and over, adult: Secondary | ICD-10-CM | POA: Diagnosis not present

## 2022-10-28 DIAGNOSIS — G43E09 Chronic migraine with aura, not intractable, without status migrainosus: Secondary | ICD-10-CM

## 2022-10-28 DIAGNOSIS — F419 Anxiety disorder, unspecified: Secondary | ICD-10-CM | POA: Insufficient documentation

## 2022-10-28 DIAGNOSIS — Z1332 Encounter for screening for maternal depression: Secondary | ICD-10-CM | POA: Diagnosis not present

## 2022-10-28 DIAGNOSIS — Z3682 Encounter for antenatal screening for nuchal translucency: Secondary | ICD-10-CM

## 2022-10-28 DIAGNOSIS — F3289 Other specified depressive episodes: Secondary | ICD-10-CM

## 2022-10-28 DIAGNOSIS — Z3400 Encounter for supervision of normal first pregnancy, unspecified trimester: Secondary | ICD-10-CM | POA: Insufficient documentation

## 2022-10-28 DIAGNOSIS — F32A Depression, unspecified: Secondary | ICD-10-CM | POA: Insufficient documentation

## 2022-10-28 DIAGNOSIS — G43909 Migraine, unspecified, not intractable, without status migrainosus: Secondary | ICD-10-CM | POA: Insufficient documentation

## 2022-10-28 LAB — POCT URINALYSIS DIPSTICK OB
Blood, UA: NEGATIVE
Glucose, UA: NEGATIVE
Ketones, UA: NEGATIVE
Leukocytes, UA: NEGATIVE
Nitrite, UA: NEGATIVE
POC,PROTEIN,UA: NEGATIVE

## 2022-10-28 MED ORDER — BUPROPION HCL ER (XL) 150 MG PO TB24: 150.0000 mg | ORAL_TABLET | Freq: Every morning | ORAL | 0 refills | Status: AC

## 2022-10-28 NOTE — Progress Notes (Signed)
[place] 13+6 wks,measurements c/w dates,CRL 77.89 mm,FHR 150 bpm,normal ovaries,NB present,NT 2 mm,posterior placenta

## 2022-10-28 NOTE — Patient Instructions (Signed)
Angelica Crawford, I greatly value your feedback.  If you receive a survey following your visit with [place] today, we appreciate you taking the time to fill it out.  Thanks, [name], DNP, CNM  [facility] HAS MOVED!!! It is now [facility] ([address] [place], [place] [address]) Entrance located off of [address] Free 24/7 valet parking   Nausea & Vomiting Have saltine crackers or pretzels by your bed and eat a few bites before you raise your head out of bed in the morning Eat small frequent meals throughout the day instead of large meals Drink plenty of fluids throughout the day to stay hydrated, just don't drink a lot of fluids with your meals.  This can make your stomach fill up faster making you feel sick Do not brush your teeth right after you eat Products with real ginger are good for nausea, like ginger ale and ginger hard candy Make sure it says made with real ginger! Sucking on sour candy like lemon heads is also good for nausea If your prenatal vitamins make you nauseated, take them at night so you will sleep through the nausea Sea Bands If you feel like you need medicine for the nausea & vomiting please let us know If you are unable to keep any fluids or food down please let us know   Constipation Drink plenty of fluid, preferably water, throughout the day Eat foods high in fiber such as fruits, vegetables, and grains Exercise, such as walking, is a good way to keep your bowels regular Drink warm fluids, especially warm prune juice, or decaf coffee Eat a 1/2 cup of real oatmeal (not instant), 1/2 cup applesauce, and 1/2-1 cup warm prune juice every day If needed, you may take Colace (docusate sodium) stool softener once or twice a day to help keep the stool soft.  If you still are having problems with constipation, you may take Miralax once daily as needed to help keep your bowels regular.   Home Blood Pressure Monitoring for  Patients   Your provider has recommended that you check your blood pressure (BP) at least once a week at home. If you do not have a blood pressure cuff at home, one will be provided for you. Contact your provider if you have not received your monitor within 1 week.   Helpful Tips for Accurate Home Blood Pressure Checks  Don't smoke, exercise, or drink caffeine 30 minutes before checking your BP Use the restroom before checking your BP (a full bladder can raise your pressure) Relax in a comfortable upright chair Feet on the ground Left arm resting comfortably on a flat surface at the level of your heart Legs uncrossed Back supported Sit quietly and don't talk Place the cuff on your bare arm Adjust snuggly, so that only two fingertips can fit between your skin and the top of the cuff Check 2 readings separated by at least one minute Keep a log of your BP readings For a visual, please reference this diagram: http://ccnc.care/bpdiagram  Provider Name: [facility]     Phone: [phone]  Zone 1: ALL CLEAR  Continue to monitor your symptoms:  BP reading is less than 140 (top number) or less than 90 (bottom number)  No right upper stomach pain No headaches or seeing spots No feeling nauseated or throwing up No swelling in face and hands  Zone 2: CAUTION Call your doctor's office for any of the following:  BP reading is greater than 140 (top number) or greater than 90 (bottom number)  Stomach pain under your ribs in the middle or right side Headaches or seeing spots Feeling nauseated or throwing up Swelling in face and hands  Zone 3: EMERGENCY  Seek immediate medical care if you have any of the following:  BP reading is greater than160 (top number) or greater than 110 (bottom number) Severe headaches not improving with Tylenol Serious difficulty catching your breath Any worsening symptoms from Zone 2    First Trimester of Pregnancy The first trimester of pregnancy is from  week 1 until the end of week 12 (months 1 through 3). A week after a sperm fertilizes an egg, the egg will implant on the wall of the uterus. This embryo will begin to develop into a baby. Genes from you and your partner are forming the baby. The female genes determine whether the baby is a boy or a girl. At 6-8 weeks, the eyes and face are formed, and the heartbeat can be seen on ultrasound. At the end of 12 weeks, all the baby's organs are formed.  Now that you are pregnant, you will want to do everything you can to have a healthy baby. Two of the most important things are to get good prenatal care and to follow your health care provider's instructions. Prenatal care is all the medical care you receive before the baby's birth. This care will help prevent, find, and treat any problems during the pregnancy and childbirth. BODY CHANGES Your body goes through many changes during pregnancy. The changes vary from woman to woman.  You may gain or lose a couple of pounds at first. You may feel sick to your stomach (nauseous) and throw up (vomit). If the vomiting is uncontrollable, call your health care provider. You may tire easily. You may develop headaches that can be relieved by medicines approved by your health care provider. You may urinate more often. Painful urination may mean you have a bladder infection. You may develop heartburn as a result of your pregnancy. You may develop constipation because certain hormones are causing the muscles that push waste through your intestines to slow down. You may develop hemorrhoids or swollen, bulging veins (varicose veins). Your breasts may begin to grow larger and become tender. Your nipples may stick out more, and the tissue that surrounds them (areola) may become darker. Your gums may bleed and may be sensitive to brushing and flossing. Dark spots or blotches (chloasma, mask of pregnancy) may develop on your face. This will likely fade after the baby is  born. Your menstrual periods will stop. You may have a loss of appetite. You may develop cravings for certain kinds of food. You may have changes in your emotions from day to day, such as being excited to be pregnant or being concerned that something may go wrong with the pregnancy and baby. You may have more vivid and strange dreams. You may have changes in your hair. These can include thickening of your hair, rapid growth, and changes in texture. Some women also have hair loss during or after pregnancy, or hair that feels dry or thin. Your hair will most likely return to normal after your baby is born. WHAT TO EXPECT AT YOUR PRENATAL VISITS During a routine prenatal visit: You will be weighed to make sure you and the baby are growing normally. Your blood pressure will be taken. Your abdomen will be measured to track your baby's growth. The fetal  heartbeat will be listened to starting around week 10 or 12 of your pregnancy. Test results from any previous visits will be discussed. Your health care provider may ask you: How you are feeling. If you are feeling the baby move. If you have had any abnormal symptoms, such as leaking fluid, bleeding, severe headaches, or abdominal cramping. If you have any questions. Other tests that may be performed during your first trimester include: Blood tests to find your blood type and to check for the presence of any previous infections. They will also be used to check for low iron levels (anemia) and Rh antibodies. Later in the pregnancy, blood tests for diabetes will be done along with other tests if problems develop. Urine tests to check for infections, diabetes, or protein in the urine. An ultrasound to confirm the proper growth and development of the baby. An amniocentesis to check for possible genetic problems. Fetal screens for spina bifida and Down syndrome. You may need other tests to make sure you and the baby are doing well. HOME CARE  INSTRUCTIONS  Medicines Follow your health care provider's instructions regarding medicine use. Specific medicines may be either safe or unsafe to take during pregnancy. Take your prenatal vitamins as directed. If you develop constipation, try taking a stool softener if your health care provider approves. Diet Eat regular, well-balanced meals. Choose a variety of foods, such as meat or vegetable-based protein, fish, milk and low-fat dairy products, vegetables, fruits, and whole grain breads and cereals. Your health care provider will help you determine the amount of weight gain that is right for you. Avoid raw meat and uncooked cheese. These carry germs that can cause birth defects in the baby. Eating four or five small meals rather than three large meals a day may help relieve nausea and vomiting. If you start to feel nauseous, eating a few soda crackers can be helpful. Drinking liquids between meals instead of during meals also seems to help nausea and vomiting. If you develop constipation, eat more high-fiber foods, such as fresh vegetables or fruit and whole grains. Drink enough fluids to keep your urine clear or pale yellow. Activity and Exercise Exercise only as directed by your health care provider. Exercising will help you: Control your weight. Stay in shape. Be prepared for labor and delivery. Experiencing pain or cramping in the lower abdomen or low back is a good sign that you should stop exercising. Check with your health care provider before continuing normal exercises. Try to avoid standing for long periods of time. Move your legs often if you must stand in one place for a long time. Avoid heavy lifting. Wear low-heeled shoes, and practice good posture. You may continue to have sex unless your health care provider directs you otherwise. Relief of Pain or Discomfort Wear a good support bra for breast tenderness.   Take warm sitz baths to soothe any pain or discomfort caused by  hemorrhoids. Use hemorrhoid cream if your health care provider approves.   Rest with your legs elevated if you have leg cramps or low back pain. If you develop varicose veins in your legs, wear support hose. Elevate your feet for 15 minutes, 3-4 times a day. Limit salt in your diet. Prenatal Care Schedule your prenatal visits by the twelfth week of pregnancy. They are usually scheduled monthly at first, then more often in the last 2 months before delivery. Write down your questions. Take them to your prenatal visits. Keep all your prenatal visits as directed  by your health care provider. Safety Wear your seat belt at all times when driving. Make a list of emergency phone numbers, including numbers for family, friends, the hospital, and police and fire departments. General Tips Ask your health care provider for a referral to a local prenatal education class. Begin classes no later than at the beginning of month 6 of your pregnancy. Ask for help if you have counseling or nutritional needs during pregnancy. Your health care provider can offer advice or refer you to specialists for help with various needs. Do not use hot tubs, steam rooms, or saunas. Do not douche or use tampons or scented sanitary pads. Do not cross your legs for long periods of time. Avoid cat litter boxes and soil used by cats. These carry germs that can cause birth defects in the baby and possibly loss of the fetus by miscarriage or stillbirth. Avoid all smoking, herbs, alcohol, and medicines not prescribed by your health care provider. Chemicals in these affect the formation and growth of the baby. Schedule a dentist appointment. At home, brush your teeth with a soft toothbrush and be gentle when you floss. SEEK MEDICAL CARE IF:  You have dizziness. You have mild pelvic cramps, pelvic pressure, or nagging pain in the abdominal area. You have persistent nausea, vomiting, or diarrhea. You have a bad smelling vaginal  discharge. You have pain with urination. You notice increased swelling in your face, hands, legs, or ankles. SEEK IMMEDIATE MEDICAL CARE IF:  You have a fever. You are leaking fluid from your vagina. You have spotting or bleeding from your vagina. You have severe abdominal cramping or pain. You have rapid weight gain or loss. You vomit blood or material that looks like coffee grounds. You are exposed to [place] measles and have never had them. You are exposed to fifth disease or chickenpox. You develop a severe headache. You have shortness of breath. You have any kind of trauma, such as from a fall or a car accident. Document Released: 11/26/2001 Document Revised: 04/18/2014 Document Reviewed: 10/12/2013 [facility] Patient Information 2015 [place], [place]. This information is not intended to replace advice given to you by your health care provider. Make sure you discuss any questions you have with your health care provider.  Coronavirus (COVID-19) Are you at risk?  Are you at risk for the Coronavirus (COVID-19)?  To be considered HIGH RISK for Coronavirus (COVID-19), you have to meet the following criteria:  Traveled to [place], [place], [place], [place] or [place];  and have fever, cough, and shortness of breath within the last 2 weeks of travel OR Been in close contact with a person diagnosed with COVID-19 within the last 2 weeks and have fever, cough, and shortness of breath IF YOU DO NOT MEET THESE CRITERIA, YOU ARE CONSIDERED LOW RISK FOR COVID-19.  What to do if you are HIGH RISK for COVID-19?  If you are having a medical emergency, call 911. Seek medical care right away. Before you go to a doctor's office, urgent care or emergency department, call ahead and tell them about your recent travel, contact with someone diagnosed with COVID-19, and your symptoms. You should receive instructions from your physician's office regarding next steps of care.  When you arrive at healthcare provider,  tell the healthcare staff immediately you have returned from visiting [place], [place], [place], [place] or [place]; in the last two weeks or you have been in close contact with a person diagnosed with COVID-19 in the last 2 weeks.  Tell the health care staff about your symptoms: fever, cough and shortness of breath. After you have been seen by a medical provider, you will be either: Tested for (COVID-19) and discharged home on quarantine except to seek medical care if symptoms worsen, and asked to  Stay home and avoid contact with others until you get your results (4-5 days)  Avoid travel on public transportation if possible (such as bus, train, or airplane) or Sent to the Emergency Department by EMS for evaluation, COVID-19 testing, and possible admission depending on your condition and test results.  What to do if you are LOW RISK for COVID-19?  Reduce your risk of any infection by using the same precautions used for avoiding the common cold or flu:  Wash your hands often with soap and warm water for at least 20 seconds.  If soap and water are not readily available, use an alcohol-based hand sanitizer with at least 60% alcohol.  If coughing or sneezing, cover your mouth and nose by coughing or sneezing into the elbow areas of your shirt or coat, into a tissue or into your sleeve (not your hands). Avoid shaking hands with others and consider head nods or verbal greetings only. Avoid touching your eyes, nose, or mouth with unwashed hands.  Avoid close contact with people who are sick. Avoid places or events with large numbers of people in one location, like concerts or sporting events. Carefully consider travel plans you have or are making. If you are planning any travel outside or inside the [place], visit the CDC's Travelers' Health webpage for the latest health notices. If you have some symptoms but not all symptoms, continue to monitor at home and seek medical attention if your symptoms worsen. If  you are having a medical emergency, call 911.   ADDITIONAL HEALTHCARE OPTIONS FOR [place] / e-Visit: [email or web address]         [facility]: [facility]: [id number]                   [facility]: [phone]     Safe Medications in Pregnancy   Acne: Benzoyl Peroxide Salicylic Acid  Backache/Headache: Tylenol: 2 regular strength every 4 hours OR              2 Extra strength every 6 hours  Colds/Coughs/Allergies: Benadryl (alcohol free) 25 mg every 6 hours as needed Breath right strips Claritin Cepacol throat lozenges Chloraseptic throat spray Cold-Eeze- up to three times per day Cough drops, alcohol free Flonase (by prescription only) Guaifenesin Mucinex Robitussin DM (plain only, alcohol free) Saline nasal spray/drops Sudafed (pseudoephedrine) & Actifed ** use only after [redacted] weeks gestation and if you do not have high blood pressure Tylenol Vicks Vaporub Zinc lozenges Zyrtec   Constipation: Colace Ducolax suppositories Fleet enema Glycerin suppositories Metamucil Milk of magnesia Miralax Senokot Smooth move tea  Diarrhea: Kaopectate Imodium A-D  *NO pepto Bismol  Hemorrhoids: Anusol Anusol HC Preparation H Tucks  Indigestion: Tums Maalox Mylanta Zantac  Pepcid  Insomnia: Benadryl (alcohol free) 36m every 6 hours as needed Tylenol PM Unisom, no Gelcaps  Leg Cramps: Tums MagGel  Nausea/Vomiting:  Bonine Dramamine Emetrol Ginger extract Sea bands Meclizine  Nausea medication to take during pregnancy:  Unisom (doxylamine succinate 25 mg tablets) Take one tablet daily at bedtime. If symptoms are not adequately controlled, the dose can be increased to a maximum recommended dose of two tablets daily (1/2 tablet in  the morning, 1/2 tablet mid-afternoon and one at bedtime). Vitamin B6 160m tablets. Take one  tablet twice a day (up to 200 mg per day).  Skin Rashes: Aveeno products Benadryl cream or 250mevery 6 hours as needed Calamine Lotion 1% cortisone cream  Yeast infection: Gyne-lotrimin 7 Monistat 7   **If taking multiple medications, please check labels to avoid duplicating the same active ingredients **take medication as directed on the label ** Do not exceed 4000 mg of tylenol in 24 hours **Do not take medications that contain aspirin or ibuprofen

## 2022-10-28 NOTE — Progress Notes (Signed)
INITIAL OBSTETRICAL VISIT Patient name: Angelica Crawford MRN [id number]  Date of birth: [birth year] Chief Complaint:   Initial Prenatal Visit  History of Present Illness:   Angelica Crawford is a 36 y.o. G66P0000 Caucasian female at [redacted]w[redacted]d by LMP c/w u/s at 10 weeks with an Estimated Date of Delivery: 04/29/23 being seen today for her initial obstetrical visit.   Her obstetrical history is significant for first baby.   Today she reports no complaints.     10/28/2022    1:45 PM  Depression screen PHQ 2/9  Decreased Interest 0  Down, Depressed, Hopeless 0  PHQ - 2 Score 0  Altered sleeping 0  Tired, decreased energy 2  Change in appetite 2  Feeling bad or failure about yourself  0  Trouble concentrating 0  Moving slowly or fidgety/restless 0  Suicidal thoughts 0  PHQ-9 Score 4    Current Outpatient Medications:    FLUoxetine (PROZAC) 40 MG capsule, Take 80 mg by mouth daily. , Disp: , Rfl:    ondansetron (ZOFRAN-ODT) 4 MG disintegrating tablet, Take 1 tablet (4 mg total) by mouth every 8 (eight) hours as needed for nausea or vomiting., Disp: 20 tablet, Rfl: 0   Prenatal Vit-Fe Fumarate-FA (PRENATAL VITAMIN PO), Take by mouth., Disp: , Rfl:    buPROPion (WELLBUTRIN XL) 150 MG 24 hr tablet, Take 1 tablet (150 mg total) by mouth every morning., Disp: 30 tablet, Rfl: 0   EPINEPHrine 0.3 mg/0.3 mL IJ SOAJ injection, Inject 0.3 mg into the muscle as needed for anaphylaxis.  (Patient not taking: Reported on 10/28/2022), Disp: , Rfl:      Patient's last menstrual period was 07/23/2022 (approximate). Last pap ? Review of Systems:   Pertinent items are noted in HPI Denies cramping/contractions, leakage of fluid, vaginal bleeding, abnormal vaginal discharge w/ itching/odor/irritation, headaches, visual changes, shortness of breath, chest pain, abdominal pain, severe nausea/vomiting, or problems with urination or bowel movements unless otherwise stated above.  Pertinent History Reviewed:   Reviewed past medical,surgical, social, obstetrical and family history.  Reviewed problem list, medications and allergies. OB History  Gravida Para Term Preterm AB Living  1 0 0 0 0 0  SAB IAB Ectopic Multiple Live Births  0 0 0 0 0    # Outcome Date GA Lbr Len/2nd Weight Sex Delivery Anes PTL Lv  1 Current            Physical Assessment:   Vitals:   10/28/22 1428  BP: 123/84  Pulse: 77  There is no height or weight on file to calculate BMI.       Physical Examination:  General appearance - well appearing, and in no distress  Mental status - alert, oriented to person, place, and time  Psych:  She has a normal mood and affect  Skin - warm and dry, normal color, no suspicious lesions noted  Chest - effort normal  Heart - normal rate and regular rhythm  Abdomen - soft, nontender  Extremities:  No swelling or varicosities noted  Pelvic - VULVA: normal appearing vulva with no masses, tenderness or lesions  VAGINA: normal appearing vagina with normal color and discharge, no lesions  CERVIX: normal appearing cervix without discharge or lesions, no CMT  Thin prep pap is done w/ HR HPV cotesting  TODAY'S NT   Angelica Crawford 13+6 wks,measurements c/w dates,CRL 77.89 mm,FHR 150 bpm,normal ovaries,NB present,NT 2 mm,posterior placenta      Results for orders placed or performed in visit on  10/28/22 (from the past 24 hour(s))  POC Urinalysis Dipstick OB   Collection Time: 10/28/22  2:51 PM  Result Value Ref Range   Color, UA     Clarity, UA     Glucose, UA Negative Negative   Bilirubin, UA     Ketones, UA neg    Spec Grav, UA     Blood, UA neg    pH, UA     POC,PROTEIN,UA Negative Negative, Trace, Small (1+), Moderate (2+), Large (3+), 4+   Urobilinogen, UA     Nitrite, UA neg    Leukocytes, UA Negative Negative   Appearance     Odor      Assessment & Plan:  1) low-Risk Pregnancy G1P0000 at [redacted]w[redacted]d with an Estimated Date of Delivery: 04/29/23   2) Initial OB visit  3)  anxiety/depression;  continue prozac,Offered therapy referral, declined  4)  morbid obesity:  Growth Angelica Crawford q 4 weeks starting 24 weeks, testing at 36 weeks, IOL 39-40 per MFM  allergic to ASA  Meds:  Meds ordered this encounter  Medications   buPROPion (WELLBUTRIN XL) 150 MG 24 hr tablet    Sig: Take 1 tablet (150 mg total) by mouth every morning.    Dispense:  30 tablet    Refill:  0    Order Specific Question:   Supervising Provider    Answer:   Duane Lope H [[id number]]    Initial labs obtained Continue prenatal vitamins Reviewed n/v relief measures and warning s/s to report Reviewed recommended weight gain based on pre-gravid BMI Encouraged well-balanced diet Genetic & carrier screening discussed: requests Panorama, NT/IT, and Horizon , declines AFP Ultrasound discussed; fetal survey: requested [facility]NC completed> form faxed if has or is planning to apply for medicaid The nature of [facility] [employer] with multiple MDs and other Advanced Practice Providers was explained to patient; also emphasized that fellows, residents, and students are part of our team. Has home bp cuff. Check bp weekly, let Angelica Crawford know if >140/90.        Angelica Crawford 3:25 PM

## 2022-10-30 LAB — CBC/D/PLT+RPR+RH+ABO+RUBIGG...
Antibody Screen: NEGATIVE
Basophils Absolute: 0.1 10*3/uL (ref 0.0–0.2)
Basos: 1 %
EOS (ABSOLUTE): 0.1 10*3/uL (ref 0.0–0.4)
Eos: 2 %
HCV Ab: NONREACTIVE
HIV Screen 4th Generation wRfx: NONREACTIVE
Hematocrit: 43.4 % (ref 34.0–46.6)
Hemoglobin: 14.2 g/dL (ref 11.1–15.9)
Hepatitis B Surface Ag: NEGATIVE
Immature Grans (Abs): 0 10*3/uL (ref 0.0–0.1)
Immature Granulocytes: 0 %
Lymphocytes Absolute: 2.2 10*3/uL (ref 0.7–3.1)
Lymphs: 27 %
MCH: 29.3 pg (ref 26.6–33.0)
MCHC: 32.7 g/dL (ref 31.5–35.7)
MCV: 90 fL (ref 79–97)
Monocytes Absolute: 0.6 10*3/uL (ref 0.1–0.9)
Monocytes: 8 %
Neutrophils Absolute: 5.2 10*3/uL (ref 1.4–7.0)
Neutrophils: 62 %
Platelets: 313 10*3/uL (ref 150–450)
RBC: 4.84 x10E6/uL (ref 3.77–5.28)
RDW: 13.5 % (ref 11.7–15.4)
RPR Ser Ql: REACTIVE — AB
Rh Factor: POSITIVE
Rubella Antibodies, IGG: 13 index (ref >0.99)
WBC: 8.3 10*3/uL (ref 3.4–10.8)

## 2022-10-30 LAB — RPR, QUANT+TP ABS (REFLEX)
Rapid Plasma Reagin, Quant: 1:1 {titer} — ABNORMAL HIGH
T Pallidum Abs: NONREACTIVE

## 2022-10-30 LAB — INTEGRATED 1
Crown Rump Length: 77.9 mm
Gest. Age on Collection Date: 13.6 wk
Maternal Age at EDD: 38.8 a
Nuchal Translucency (NT): 2 mm
Number of Fetuses: 1
PAPP-A Value: 504.7 ng/mL
Weight: 262 [lb_av]

## 2022-10-30 LAB — HEMOGLOBIN A1C
Est. average glucose Bld gHb Est-mCnc: 103 mg/dL
Hgb A1c MFr Bld: 5.2 % (ref 4.8–5.6)

## 2022-10-30 LAB — URINE CULTURE

## 2022-10-30 LAB — HCV INTERPRETATION

## 2022-10-31 ENCOUNTER — Encounter: Payer: Self-pay | Admitting: Advanced Practice Midwife

## 2022-10-31 LAB — CYTOLOGY - PAP
Chlamydia: NEGATIVE
Comment: NEGATIVE
Comment: NEGATIVE
Comment: NORMAL
Diagnosis: NEGATIVE
High risk HPV: NEGATIVE
Neisseria Gonorrhea: NEGATIVE

## 2022-11-02 LAB — PANORAMA PRENATAL TEST FULL PANEL:PANORAMA TEST PLUS 5 ADDITIONAL MICRODELETIONS: FETAL FRACTION: 2.7

## 2022-11-08 LAB — HORIZON CUSTOM: REPORT SUMMARY: POSITIVE — AB

## 2022-11-09 ENCOUNTER — Ambulatory Visit
Admission: EM | Admit: 2022-11-09 | Discharge: 2022-11-09 | Discharge status: Against Medical Advice | Payer: BC Managed Care – PPO

## 2022-11-11 ENCOUNTER — Encounter: Payer: Self-pay | Admitting: Advanced Practice Midwife

## 2022-11-11 DIAGNOSIS — Z141 Cystic fibrosis carrier: Secondary | ICD-10-CM | POA: Insufficient documentation

## 2022-11-20 ENCOUNTER — Ambulatory Visit (INDEPENDENT_AMBULATORY_CARE_PROVIDER_SITE_OTHER): Payer: BC Managed Care – PPO | Admitting: Advanced Practice Midwife

## 2022-11-20 VITALS — BP 134/79 | HR 85 | Wt 273.0 lb

## 2022-11-20 DIAGNOSIS — Z3402 Encounter for supervision of normal first pregnancy, second trimester: Secondary | ICD-10-CM

## 2022-11-20 DIAGNOSIS — Z3A17 17 weeks gestation of pregnancy: Secondary | ICD-10-CM

## 2022-11-20 NOTE — Patient Instructions (Signed)
[place], thank you for choosing our office today! We appreciate the opportunity to meet your healthcare needs. You may receive a short survey by mail, e-mail, or through [employer]. If you are happy with your care we would appreciate if you could take just a few minutes to complete the survey questions. We read all of your comments and take your feedback very seriously. Thank you again for choosing our office.  Center for [employer] Team at [facility] at [facility] ([address] [place], [place] [address]) Entrance C, located off of [address] Free 24/7 valet parking  Go to [email or web address] to register for FREE online childbirth classes  Call the office [phone]) or go to [facility] if: You begin to severe cramping Your water breaks.  Sometimes it is a big gush of fluid, sometimes it is just a trickle that keeps getting your panties wet or running down your legs You have vaginal bleeding.  It is normal to have a small amount of spotting if your cervix was checked.   [facility] Pediatricians/Family Doctors [facility]): [address]. Colette Ribas, [phone]           [facility]: [address], [phone]                [facility]): [address], [phone] (call to ask if accepting patients) [facility] Department: [address], [place], [phone]    [facility] Pediatricians/Family Doctors [facility]): [phone], [phone] [facility]: [address] [place], [phone] [facility]: [address], [phone]  [facility] Doctors  [facility]): [phone] [facility]: [address], [phone]   [facility] Doctors [facility]: [address], [phone]  [facility] Doctors  [facility]: [phone], [phone]  Home Blood Pressure Monitoring for Patients   Your provider has recommended that you check your blood pressure (BP) at least once a week at home. If you do not have a blood pressure cuff at home, one will be provided for you. Contact your provider if you have not received your monitor within 1 week.   Helpful Tips for Accurate Home Blood Pressure Checks  Don't smoke, exercise, or drink caffeine 30 minutes before checking your BP Use the restroom before checking your BP (a full bladder can raise your pressure) Relax in a comfortable upright chair Feet on the ground Left arm resting comfortably on a flat surface at the level of your heart Legs uncrossed Back supported Sit quietly and don't talk Place the cuff on your bare arm Adjust snuggly, so that only two fingertips can fit between your skin and the top of the cuff Check 2 readings separated by at least one minute Keep a log of your BP readings For a visual, please reference this diagram: http://ccnc.care/bpdiagram  Provider Name: [facility]     Phone: [phone]  Zone 1: ALL CLEAR  Continue to monitor your symptoms:  BP reading is less than 140 (top number) or less than 90 (bottom number)  No right upper stomach pain No headaches or seeing spots No feeling nauseated or throwing up No swelling in face and hands  Zone 2: CAUTION Call your doctor's office for any of the following:  BP reading is greater than 140 (top number) or greater than  90 (bottom number)  Stomach pain under your ribs in the middle or right side Headaches or seeing spots Feeling nauseated or throwing up Swelling in face and hands  Zone 3: EMERGENCY  Seek immediate medical care if you have any of the following:  BP reading is greater than160 (top number) or greater than 110 (bottom number) Severe headaches not improving with Tylenol Serious difficulty catching your breath Any worsening symptoms from  Zone 2     Second Trimester of Pregnancy The second trimester is from week 14 through week 27 (months 4 through 6). The second trimester is often a time when you feel your best. Your body has adjusted to being pregnant, and you begin to feel better physically. Usually, morning sickness has lessened or quit completely, you may have more energy, and you may have an increase in appetite. The second trimester is also a time when the fetus is growing rapidly. At the end of the sixth month, the fetus is about 9 inches long and weighs about 1 pounds. You will likely begin to feel the baby move (quickening) between 16 and 20 weeks of pregnancy. Body changes during your second trimester Your body continues to go through many changes during your second trimester. The changes vary from woman to woman. Your weight will continue to increase. You will notice your lower abdomen bulging out. You may begin to get stretch marks on your hips, abdomen, and breasts. You may develop headaches that can be relieved by medicines. The medicines should be approved by your health care provider. You may urinate more often because the fetus is pressing on your bladder. You may develop or continue to have heartburn as a result of your pregnancy. You may develop constipation because certain hormones are causing the muscles that push waste through your intestines to slow down. You may develop hemorrhoids or swollen, bulging veins (varicose veins). You may have back pain. This is caused by: Weight gain. Pregnancy hormones that are relaxing the joints in your pelvis. A shift in weight and the muscles that support your balance. Your breasts will continue to grow and they will continue to become tender. Your gums may bleed and may be sensitive to brushing and flossing. Dark spots or blotches (chloasma, mask of pregnancy) may develop on your face. This will likely fade after the baby is born. A dark line from your belly button to  the pubic area (linea nigra) may appear. This will likely fade after the baby is born. You may have changes in your hair. These can include thickening of your hair, rapid growth, and changes in texture. Some women also have hair loss during or after pregnancy, or hair that feels dry or thin. Your hair will most likely return to normal after your baby is born.  What to expect at prenatal visits During a routine prenatal visit: You will be weighed to make sure you and the fetus are growing normally. Your blood pressure will be taken. Your abdomen will be measured to track your baby's growth. The fetal heartbeat will be listened to. Any test results from the previous visit will be discussed.  Your health care provider may ask you: How you are feeling. If you are feeling the baby move. If you have had any abnormal symptoms, such as leaking fluid, bleeding, severe headaches, or abdominal cramping. If you are using any tobacco products, including cigarettes, chewing tobacco, and electronic cigarettes. If you have any questions.  Other tests that may be performed during   your second trimester include: Blood tests that check for: Low iron levels (anemia). High blood sugar that affects pregnant women (gestational diabetes) between 24 and 28 weeks. Rh antibodies. This is to check for a protein on red blood cells (Rh factor). Urine tests to check for infections, diabetes, or protein in the urine. An ultrasound to confirm the proper growth and development of the baby. An amniocentesis to check for possible genetic problems. Fetal screens for spina bifida and Down syndrome. HIV (human immunodeficiency virus) testing. Routine prenatal testing includes screening for HIV, unless you choose not to have this test.  Follow these instructions at home: Medicines Follow your health care provider's instructions regarding medicine use. Specific medicines may be either safe or unsafe to take during  pregnancy. Take a prenatal vitamin that contains at least 600 micrograms (mcg) of folic acid. If you develop constipation, try taking a stool softener if your health care provider approves. Eating and drinking Eat a balanced diet that includes fresh fruits and vegetables, whole grains, good sources of protein such as meat, eggs, or tofu, and low-fat dairy. Your health care provider will help you determine the amount of weight gain that is right for you. Avoid raw meat and uncooked cheese. These carry germs that can cause birth defects in the baby. If you have low calcium intake from food, talk to your health care provider about whether you should take a daily calcium supplement. Limit foods that are high in fat and processed sugars, such as fried and sweet foods. To prevent constipation: Drink enough fluid to keep your urine clear or pale yellow. Eat foods that are high in fiber, such as fresh fruits and vegetables, whole grains, and beans. Activity Exercise only as directed by your health care provider. Most women can continue their usual exercise routine during pregnancy. Try to exercise for 30 minutes at least 5 days a week. Stop exercising if you experience uterine contractions. Avoid heavy lifting, wear low heel shoes, and practice good posture. A sexual relationship may be continued unless your health care provider directs you otherwise. Relieving pain and discomfort Wear a good support bra to prevent discomfort from breast tenderness. Take warm sitz baths to soothe any pain or discomfort caused by hemorrhoids. Use hemorrhoid cream if your health care provider approves. Rest with your legs elevated if you have leg cramps or low back pain. If you develop varicose veins, wear support hose. Elevate your feet for 15 minutes, 3-4 times a day. Limit salt in your diet. Prenatal Care Write down your questions. Take them to your prenatal visits. Keep all your prenatal visits as told by your health  care provider. This is important. Safety Wear your seat belt at all times when driving. Make a list of emergency phone numbers, including numbers for family, friends, the hospital, and police and fire departments. General instructions Ask your health care provider for a referral to a local prenatal education class. Begin classes no later than the beginning of month 6 of your pregnancy. Ask for help if you have counseling or nutritional needs during pregnancy. Your health care provider can offer advice or refer you to specialists for help with various needs. Do not use hot tubs, steam rooms, or saunas. Do not douche or use tampons or scented sanitary pads. Do not cross your legs for long periods of time. Avoid cat litter boxes and soil used by cats. These carry germs that can cause birth defects in the baby and possibly loss of the   fetus by miscarriage or stillbirth. Avoid all smoking, herbs, alcohol, and unprescribed drugs. Chemicals in these products can affect the formation and growth of the baby. Do not use any products that contain nicotine or tobacco, such as cigarettes and e-cigarettes. If you need help quitting, ask your health care provider. Visit your dentist if you have not gone yet during your pregnancy. Use a soft toothbrush to brush your teeth and be gentle when you floss. Contact a health care provider if: You have dizziness. You have mild pelvic cramps, pelvic pressure, or nagging pain in the abdominal area. You have persistent nausea, vomiting, or diarrhea. You have a bad smelling vaginal discharge. You have pain when you urinate. Get help right away if: You have a fever. You are leaking fluid from your vagina. You have spotting or bleeding from your vagina. You have severe abdominal cramping or pain. You have rapid weight gain or weight loss. You have shortness of breath with chest pain. You notice sudden or extreme swelling of your face, hands, ankles, feet, or legs. You  have not felt your baby move in over an hour. You have severe headaches that do not go away when you take medicine. You have vision changes. Summary The second trimester is from week 14 through week 27 (months 4 through 6). It is also a time when the fetus is growing rapidly. Your body goes through many changes during pregnancy. The changes vary from woman to woman. Avoid all smoking, herbs, alcohol, and unprescribed drugs. These chemicals affect the formation and growth your baby. Do not use any tobacco products, such as cigarettes, chewing tobacco, and e-cigarettes. If you need help quitting, ask your health care provider. Contact your health care provider if you have any questions. Keep all prenatal visits as told by your health care provider. This is important. This information is not intended to replace advice given to you by your health care provider. Make sure you discuss any questions you have with your health care provider. Document Released: 11/26/2001 Document Revised: 05/09/2016 Document Reviewed: 02/02/2013 Elsevier Interactive Patient Education  2017 [employer] Inc[employer]  

## 2022-11-20 NOTE — Progress Notes (Signed)
   LOW-RISK PREGNANCY VISIT Patient name: Angelica Crawford MRN [id number]  Date of birth: Jun 25, 1986 Chief Complaint:   Routine Prenatal Visit  History of Present Illness:   Angelica Crawford is a 36 y.o. G45P0000 female at [redacted]w[redacted]d with an Estimated Date of Delivery: 04/29/23 being seen today for ongoing management of a low-risk pregnancy.  Today she reports no complaints. Contractions: Not present. Vag. Bleeding: None.   . denies leaking of fluid. Review of Systems:   Pertinent items are noted in HPI Denies abnormal vaginal discharge w/ itching/odor/irritation, headaches, visual changes, shortness of breath, chest pain, abdominal pain, severe nausea/vomiting, or problems with urination or bowel movements unless otherwise stated above. Pertinent History Reviewed:  Reviewed past medical,surgical, social, obstetrical and family history.  Reviewed problem list, medications and allergies. Physical Assessment:   Vitals:   11/20/22 1607  BP: 134/79  Pulse: 85  Weight: 273 lb (123.8 kg)  Body mass index is 45.43 kg/m.        Physical Examination:   General appearance: Well appearing, and in no distress  Mental status: Alert, oriented to person, place, and time  Skin: Warm & dry  Cardiovascular: Normal heart rate noted  Respiratory: Normal respiratory effort, no distress  Abdomen: Soft, gravid, nontender  Pelvic: Cervical exam deferred         Extremities: Edema: None  Fetal Status: Fetal Heart Rate (bpm): 146        No results found for this or any previous visit (from the past 24 hour(s)).  Assessment & Plan:  1) Low-risk pregnancy G1P0000 at [redacted]w[redacted]d with an Estimated Date of Delivery: 04/29/23   2) CF carrier, FOB will get screened   Meds: No orders of the defined types were placed in this encounter.  Labs/procedures today: 2nd IT; Panorama was redrawn by [employer]  Plan:  Continue routine obstetrical care   Reviewed: Preterm labor symptoms and general obstetric precautions including  but not limited to vaginal bleeding, contractions, leaking of fluid and fetal movement were reviewed in detail with the patient.  All questions were answered. Has home bp cuff. Check bp weekly, let us know if >140/90.   Follow-up: Return for As scheduled.(Anatomy u/s)  Orders Placed This Encounter  Procedures   INTEGRATED 2   Arabella Merles [facility] 11/20/2022 4:31 PM

## 2022-11-22 LAB — INTEGRATED 2
AFP MoM: 1.05
Alpha-Fetoprotein: 23.1 ng/mL
Crown Rump Length: 77.9 mm
DIA MoM: 0.57
DIA Value: 63.5 pg/mL
Estriol, Unconjugated: 0.9 ng/mL
Gest. Age on Collection Date: 13.6 weeks
Gestational Age: 16.9 weeks
Maternal Age at EDD: 38.8 yr
Nuchal Translucency (NT): 2 mm
Nuchal Translucency MoM: 1.1
Number of Fetuses: 1
PAPP-A MoM: 0.69
PAPP-A Value: 504.7 ng/mL
Test Results:: NEGATIVE
Weight: 262 [lb_av]
Weight: 262 [lb_av]
hCG MoM: 0.47
hCG Value: 9.5 IU/mL
uE3 MoM: 0.96

## 2022-12-06 ENCOUNTER — Other Ambulatory Visit: Payer: Self-pay | Admitting: Advanced Practice Midwife

## 2022-12-06 DIAGNOSIS — Z363 Encounter for antenatal screening for malformations: Secondary | ICD-10-CM

## 2022-12-11 ENCOUNTER — Ambulatory Visit (INDEPENDENT_AMBULATORY_CARE_PROVIDER_SITE_OTHER): Payer: BC Managed Care – PPO | Admitting: Advanced Practice Midwife

## 2022-12-11 ENCOUNTER — Ambulatory Visit (INDEPENDENT_AMBULATORY_CARE_PROVIDER_SITE_OTHER): Payer: BC Managed Care – PPO

## 2022-12-11 VITALS — BP 122/76 | HR 90 | Wt 278.0 lb

## 2022-12-11 DIAGNOSIS — Z141 Cystic fibrosis carrier: Secondary | ICD-10-CM

## 2022-12-11 DIAGNOSIS — Z3A20 20 weeks gestation of pregnancy: Secondary | ICD-10-CM

## 2022-12-11 DIAGNOSIS — Z363 Encounter for antenatal screening for malformations: Secondary | ICD-10-CM

## 2022-12-11 DIAGNOSIS — Z3402 Encounter for supervision of normal first pregnancy, second trimester: Secondary | ICD-10-CM

## 2022-12-11 DIAGNOSIS — O99212 Obesity complicating pregnancy, second trimester: Secondary | ICD-10-CM

## 2022-12-11 NOTE — Patient Instructions (Signed)
Angelica Crawford, thank you for choosing our office today! We appreciate the opportunity to meet your healthcare needs. You may receive a short survey by mail, e-mail, or through MyChart. If you are happy with your care we would appreciate if you could take just a few minutes to complete the survey questions. We read all of your comments and take your feedback very seriously. Thank you again for choosing our office.  [facility] Team at [facility] ([address] [place], [place] [address]) Entrance C, located off of [address] Free 24/7 valet parking  Go to [email or web address] to register for FREE online childbirth classes  Call the office ([phone]) or go to [facility] if: You begin to severe cramping Your water breaks.  Sometimes it is a big gush of fluid, sometimes it is just a trickle that keeps getting your panties wet or running down your legs You have vaginal bleeding.  It is normal to have a small amount of spotting if your cervix was checked.   [place] [facility]/Family Doctors [facility] ([facility]): [address], [phone]           [facility]: [address], [phone]                [facility] ([facility]): [address], [phone] (call to ask if accepting patients) [facility]: [address], [place], [phone]    [place] [facility]/Family Doctors [facility] ([facility]): [address], [phone] [facility]: [address], [phone] [facility]: [address], [phone]  [place] [facility]  [facility] ([facility]): [phone] [facility]: [address], [phone]   [place] [facility]: [address], [phone]  [place] [facility]  [facility]: [address], [phone]  Home Blood Pressure Monitoring for Patients   Your provider has recommended that you check your blood pressure (BP) at least once a week at home. If you do not have a blood pressure cuff at home, one will be provided for you. Contact your provider if you have not received your monitor within 1 week.   Helpful Tips for Accurate Home Blood Pressure Checks  Don't smoke, exercise, or drink caffeine 30 minutes before checking your BP Use the restroom before checking your BP (a full bladder can raise your pressure) Relax in a comfortable upright chair Feet on the ground Left arm resting comfortably on a flat surface at the level of your heart Legs uncrossed Back supported Sit quietly and don't talk Place the cuff on your bare arm Adjust snuggly, so that only two fingertips can fit between your skin and the top of the cuff Check 2 readings separated by at least one minute Keep a log of your BP readings For a visual, please reference this diagram: http://[facility].care/bpdiagram  Provider Name: [facility]     Phone: [phone]  Zone 1: ALL CLEAR  Continue to monitor your symptoms:  BP reading is less than 140 (top number) or less than 90 (bottom number)  No right upper stomach pain No headaches or seeing spots No feeling nauseated or throwing up No swelling in face and hands  Zone 2: CAUTION Call your doctor's office for any of the following:  BP reading is greater than 140 (top number) or greater than   90 (bottom number)  Stomach pain under your ribs in the middle or right side Headaches or seeing spots Feeling nauseated or throwing up Swelling in face and hands  Zone 3: EMERGENCY  Seek immediate medical care if you have any of the following:  BP reading is greater than160 (top number) or greater than 110 (bottom number) Severe headaches not improving with Tylenol Serious difficulty catching your breath Any worsening symptoms from  Zone 2     Second Trimester of Pregnancy The second trimester is from week 14 through week 27 (months 4 through 6). The second trimester is often a time when you feel your best. Your body has adjusted to being pregnant, and you begin to feel better physically. Usually, morning sickness has lessened or quit completely, you may have more energy, and you may have an increase in appetite. The second trimester is also a time when the fetus is growing rapidly. At the end of the sixth month, the fetus is about 9 inches long and weighs about 1 pounds. You will likely begin to feel the baby move (quickening) between 16 and 20 weeks of pregnancy. Body changes during your second trimester Your body continues to go through many changes during your second trimester. The changes vary from woman to woman. Your weight will continue to increase. You will notice your lower abdomen bulging out. You may begin to get stretch marks on your hips, abdomen, and breasts. You may develop headaches that can be relieved by medicines. The medicines should be approved by your health care provider. You may urinate more often because the fetus is pressing on your bladder. You may develop or continue to have heartburn as a result of your pregnancy. You may develop constipation because certain hormones are causing the muscles that push waste through your intestines to slow down. You may develop hemorrhoids or swollen, bulging veins (varicose veins). You may have back pain. This is caused by: Weight gain. Pregnancy hormones that are relaxing the joints in your pelvis. A shift in weight and the muscles that support your balance. Your breasts will continue to grow and they will continue to become tender. Your gums may bleed and may be sensitive to brushing and flossing. Dark spots or blotches (chloasma, mask of pregnancy) may develop on your face. This will likely fade after the baby is born. A dark line from your belly button to  the pubic area (linea nigra) may appear. This will likely fade after the baby is born. You may have changes in your hair. These can include thickening of your hair, rapid growth, and changes in texture. Some women also have hair loss during or after pregnancy, or hair that feels dry or thin. Your hair will most likely return to normal after your baby is born.  What to expect at prenatal visits During a routine prenatal visit: You will be weighed to make sure you and the fetus are growing normally. Your blood pressure will be taken. Your abdomen will be measured to track your baby's growth. The fetal heartbeat will be listened to. Any test results from the previous visit will be discussed.  Your health care provider may ask you: How you are feeling. If you are feeling the baby move. If you have had any abnormal symptoms, such as leaking fluid, bleeding, severe headaches, or abdominal cramping. If you are using any tobacco products, including cigarettes, chewing tobacco, and electronic cigarettes. If you have any questions.  Other tests that may be performed during   your second trimester include: Blood tests that check for: Low iron levels (anemia). High blood sugar that affects pregnant women (gestational diabetes) between 24 and 28 weeks. Rh antibodies. This is to check for a protein on red blood cells (Rh factor). Urine tests to check for infections, diabetes, or protein in the urine. An ultrasound to confirm the proper growth and development of the baby. An amniocentesis to check for possible genetic problems. Fetal screens for spina bifida and Down syndrome. HIV (human immunodeficiency virus) testing. Routine prenatal testing includes screening for HIV, unless you choose not to have this test.  Follow these instructions at home: Medicines Follow your health care provider's instructions regarding medicine use. Specific medicines may be either safe or unsafe to take during  pregnancy. Take a prenatal vitamin that contains at least 600 micrograms (mcg) of folic acid. If you develop constipation, try taking a stool softener if your health care provider approves. Eating and drinking Eat a balanced diet that includes fresh fruits and vegetables, whole grains, good sources of protein such as meat, eggs, or tofu, and low-fat dairy. Your health care provider will help you determine the amount of weight gain that is right for you. Avoid raw meat and uncooked cheese. These carry germs that can cause birth defects in the baby. If you have low calcium intake from food, talk to your health care provider about whether you should take a daily calcium supplement. Limit foods that are high in fat and processed sugars, such as fried and sweet foods. To prevent constipation: Drink enough fluid to keep your urine clear or pale yellow. Eat foods that are high in fiber, such as fresh fruits and vegetables, whole grains, and beans. Activity Exercise only as directed by your health care provider. Most women can continue their usual exercise routine during pregnancy. Try to exercise for 30 minutes at least 5 days a week. Stop exercising if you experience uterine contractions. Avoid heavy lifting, wear low heel shoes, and practice good posture. A sexual relationship may be continued unless your health care provider directs you otherwise. Relieving pain and discomfort Wear a good support bra to prevent discomfort from breast tenderness. Take warm sitz baths to soothe any pain or discomfort caused by hemorrhoids. Use hemorrhoid cream if your health care provider approves. Rest with your legs elevated if you have leg cramps or low back pain. If you develop varicose veins, wear support hose. Elevate your feet for 15 minutes, 3-4 times a day. Limit salt in your diet. Prenatal Care Write down your questions. Take them to your prenatal visits. Keep all your prenatal visits as told by your health  care provider. This is important. Safety Wear your seat belt at all times when driving. Make a list of emergency phone numbers, including numbers for family, friends, the hospital, and police and fire departments. General instructions Ask your health care provider for a referral to a local prenatal education class. Begin classes no later than the beginning of month 6 of your pregnancy. Ask for help if you have counseling or nutritional needs during pregnancy. Your health care provider can offer advice or refer you to specialists for help with various needs. Do not use hot tubs, steam rooms, or saunas. Do not douche or use tampons or scented sanitary pads. Do not cross your legs for long periods of time. Avoid cat litter boxes and soil used by cats. These carry germs that can cause birth defects in the baby and possibly loss of the   fetus by miscarriage or stillbirth. Avoid all smoking, herbs, alcohol, and unprescribed drugs. Chemicals in these products can affect the formation and growth of the baby. Do not use any products that contain nicotine or tobacco, such as cigarettes and e-cigarettes. If you need help quitting, ask your health care provider. Visit your dentist if you have not gone yet during your pregnancy. Use a soft toothbrush to brush your teeth and be gentle when you floss. Contact a health care provider if: You have dizziness. You have mild pelvic cramps, pelvic pressure, or nagging pain in the abdominal area. You have persistent nausea, vomiting, or diarrhea. You have a bad smelling vaginal discharge. You have pain when you urinate. Get help right away if: You have a fever. You are leaking fluid from your vagina. You have spotting or bleeding from your vagina. You have severe abdominal cramping or pain. You have rapid weight gain or weight loss. You have shortness of breath with chest pain. You notice sudden or extreme swelling of your face, hands, ankles, feet, or legs. You  have not felt your baby move in over an hour. You have severe headaches that do not go away when you take medicine. You have vision changes. Summary The second trimester is from week 14 through week 27 (months 4 through 6). It is also a time when the fetus is growing rapidly. Your body goes through many changes during pregnancy. The changes vary from woman to woman. Avoid all smoking, herbs, alcohol, and unprescribed drugs. These chemicals affect the formation and growth your baby. Do not use any tobacco products, such as cigarettes, chewing tobacco, and e-cigarettes. If you need help quitting, ask your health care provider. Contact your health care provider if you have any questions. Keep all prenatal visits as told by your health care provider. This is important. This information is not intended to replace advice given to you by your health care provider. Make sure you discuss any questions you have with your health care provider. Document Released: 11/26/2001 Document Revised: 05/09/2016 Document Reviewed: 02/02/2013 Elsevier Interactive Patient Education  2017 [employer]  

## 2022-12-11 NOTE — Progress Notes (Signed)
[place] 20+1 wks,breech,posterior placenta gr 0,normal ovaries,cx 3.9 cm,SVP of fluid 5.4 cm,FHR 150 bpm,EFW 313 g 27%,limited view of spine because of fetal position,please have pt come back for additional images,no obvious abnormalities

## 2022-12-11 NOTE — Progress Notes (Signed)
   LOW-RISK PREGNANCY VISIT Patient name: Angelica Crawford MRN [id number]  Date of birth: 10/03/1986 Chief Complaint:   Routine Prenatal Visit  History of Present Illness:   Angelica Crawford is a 36 y.o. G81P0000 female at [redacted]w[redacted]d with an Estimated Date of Delivery: 04/29/23 being seen today for ongoing management of a low-risk pregnancy.  Today she reports no complaints. Contractions: Not present. Vag. Bleeding: None.  Movement: Absent. denies leaking of fluid. Review of Systems:   Pertinent items are noted in HPI Denies abnormal vaginal discharge w/ itching/odor/irritation, headaches, visual changes, shortness of breath, chest pain, abdominal pain, severe nausea/vomiting, or problems with urination or bowel movements unless otherwise stated above. Pertinent History Reviewed:  Reviewed past medical,surgical, social, obstetrical and family history.  Reviewed problem list, medications and allergies. Physical Assessment:   Vitals:   12/11/22 1553  BP: 122/76  Pulse: 90  Weight: 278 lb (126.1 kg)  Body mass index is 46.26 kg/m.        Physical Examination:   General appearance: Well appearing, and in no distress  Mental status: Alert, oriented to person, place, and time  Skin: Warm & dry  Cardiovascular: Normal heart rate noted  Respiratory: Normal respiratory effort, no distress  Abdomen: Soft, gravid, nontender  Pelvic: Cervical exam deferred         Extremities: Edema: None  Fetal Status: Fetal Heart Rate (bpm): 150 u/s   Movement: Absent    Anatomy u/s: [place] 20+1 wks,breech,posterior placenta gr 0,normal ovaries,cx 3.9 cm,SVP of fluid 5.4 cm,FHR 150 bpm,EFW 313 g 27%,limited view of spine because of fetal position,please have pt come back for additional images,no obvious abnormalities   No results found for this or any previous visit (from the past 24 hour(s)).  Assessment & Plan:  1) Low-risk pregnancy G1P0000 at [redacted]w[redacted]d with an Estimated Date of Delivery: 04/29/23   2) +CF carrier,  waiting for [employer] to set up screening for FOB  3) Morbid obesity, growth u/s q 4wks, weekly BPP @ 36wks, IOL 39-40wk   Meds: No orders of the defined types were placed in this encounter.  Labs/procedures today: anatomy u/s (incomplete spine views)  Plan:  Continue routine obstetrical care   Reviewed: Preterm labor symptoms and general obstetric precautions including but not limited to vaginal bleeding, contractions, leaking of fluid and fetal movement were reviewed in detail with the patient.  All questions were answered. Has home bp cuff. Check bp weekly, let us know if >140/90.   Follow-up: Return in about 4 weeks (around 01/08/2023) for LROB, [place]: EFW (and spine images).  Orders Placed This Encounter  Procedures   US OB Follow Up   Arabella Merles [facility] 12/11/2022 4:11 PM

## 2023-01-08 ENCOUNTER — Ambulatory Visit (INDEPENDENT_AMBULATORY_CARE_PROVIDER_SITE_OTHER): Payer: BC Managed Care – PPO

## 2023-01-08 ENCOUNTER — Ambulatory Visit (INDEPENDENT_AMBULATORY_CARE_PROVIDER_SITE_OTHER): Payer: BC Managed Care – PPO | Admitting: Advanced Practice Midwife

## 2023-01-08 VITALS — BP 126/81 | HR 83 | Wt 291.0 lb

## 2023-01-08 DIAGNOSIS — Z141 Cystic fibrosis carrier: Secondary | ICD-10-CM

## 2023-01-08 DIAGNOSIS — O99212 Obesity complicating pregnancy, second trimester: Secondary | ICD-10-CM | POA: Diagnosis not present

## 2023-01-08 DIAGNOSIS — Z3402 Encounter for supervision of normal first pregnancy, second trimester: Secondary | ICD-10-CM

## 2023-01-08 DIAGNOSIS — Z3A24 24 weeks gestation of pregnancy: Secondary | ICD-10-CM

## 2023-01-08 NOTE — Progress Notes (Signed)
[place] 82+8 wks,cephalic,cx 4.1 cm,posterior placenta gr 0,SVP of fluid 6.1 cm,FHR 146 bpm,EFW 623 g 24%

## 2023-01-08 NOTE — Progress Notes (Signed)
   LOW-RISK PREGNANCY VISIT Patient name: Angelica Crawford MRN [id number]  Date of birth: September 29, 1986 Chief Complaint:   Routine Prenatal Visit  History of Present Illness:   Angelica Crawford is a 37 y.o. G62P0000 female at [redacted]w[redacted]d with an Estimated Date of Delivery: 04/29/23 being seen today for ongoing management of a low-risk pregnancy.  Today she reports  feeling very fatigued; questions about when to begin maternity leave . Contractions: Not present. Vag. Bleeding: None.  Movement: Present. denies leaking of fluid. Review of Systems:   Pertinent items are noted in HPI Denies abnormal vaginal discharge w/ itching/odor/irritation, headaches, visual changes, shortness of breath, chest pain, abdominal pain, severe nausea/vomiting, or problems with urination or bowel movements unless otherwise stated above. Pertinent History Reviewed:  Reviewed past medical,surgical, social, obstetrical and family history.  Reviewed problem list, medications and allergies. Physical Assessment:   Vitals:   01/08/23 1527  BP: 126/81  Pulse: 83  Weight: 291 lb (132 kg)  Body mass index is 48.42 kg/m.        Physical Examination:   General appearance: Well appearing, and in no distress  Mental status: Alert, oriented to person, place, and time  Skin: Warm & dry  Cardiovascular: Normal heart rate noted  Respiratory: Normal respiratory effort, no distress  Abdomen: Soft, gravid, nontender  Pelvic: Cervical exam deferred         Extremities: Edema: None  Fetal Status: Fetal Heart Rate (bpm): 146 u/s   Movement: Present    Growth u/s: [place] 61+6 wks,cephalic,cx 4.1 cm,posterior placenta gr 0,SVP of fluid 6.1 cm,FHR 146 bpm,EFW 623 g 24%   No results found for this or any previous visit (from the past 24 hour(s)).  Assessment & Plan:  1) Low-risk pregnancy G1P0000 at [redacted]w[redacted]d with an Estimated Date of Delivery: 04/29/23   2) Fatigue, Hgb 14.2 at new ob visit; will check w PN2 next visit; may want to start  maternity leave prior to delivery- issue reviewed in terms of paid vs unpaid leave; encouraged to have open communication w employer to determine best plan  3) Obesity, will continue q 4wk growth scans w IOL 39-40wk   Meds: No orders of the defined types were placed in this encounter.  Labs/procedures today: ultrasound  Plan:  Continue routine obstetrical care   Reviewed: Preterm labor symptoms and general obstetric precautions including but not limited to vaginal bleeding, contractions, leaking of fluid and fetal movement were reviewed in detail with the patient.  All questions were answered. Has home bp cuff. Check bp weekly, let us know if >140/90.   Follow-up: Return in about 4 weeks (around 02/05/2023) for [place], [place]: EFW, PN2.  Orders Placed This Encounter  Procedures   US OB Follow Up   Myrtis Ser [facility] 01/08/2023 3:50 PM

## 2023-01-08 NOTE — Patient Instructions (Signed)
[place], I greatly value your feedback.  If you receive a survey following your visit with [place] today, we appreciate you taking the time to fill it out.  Thanks, Derrill Memo, CNM   You will have your sugar test next visit.  Please do not eat or drink anything after midnight the night before you come, not even water.  You will be here for at least two hours.  Please make an appointment online for the bloodwork at [email or web address] for 8:30am (or as close to this as possible). Make sure you select the [facility] service center. The day of the appointment, check in with our office first, then you will go to [place] to start the sugar test.    [place]!!! It is now [place] at [facility] ([place], [place] [address]) Entrance C, located off of [address] parking  Go to [email or web address] to register for FREE online childbirth classes   Call the office [phone]) or go to [facility] if: You begin to have strong, frequent contractions Your water breaks.  Sometimes it is a big gush of fluid, sometimes it is just a trickle that keeps getting your panties wet or running down your legs You have vaginal bleeding.  It is normal to have a small amount of spotting if your cervix was checked.  You don't feel your baby moving like normal.  If you don't, get you something to eat and drink and lay down and focus on feeling your baby move.   If your baby is still not moving like normal, you should call the office or go to [facility]: [place] [phone]                [place] [phone] (usually not accepting new patients unless you have family there already, you are always welcome to call and ask)      [facility] Department [phone]       [facility] Pediatricians/Family Doctors:  [facility]: [phone] [facility]: [phone] [facility]: [place] Doctors:  [facility]: [place]: [place]: [place]: [phone]   Home Blood Pressure Monitoring for Patients   Your provider has recommended that you check your blood pressure (BP) at least once a week at home. If you do not have a blood pressure cuff at home, one will be provided for you. Contact your provider if you have not received your monitor within 1 week.   Helpful Tips for Accurate Home Blood Pressure Checks  Don't smoke, exercise, or drink caffeine 30 minutes before checking your BP Use the restroom before checking your BP (a full bladder can raise your pressure) Relax in a comfortable upright chair Feet on the ground Left arm resting comfortably on a flat surface at the level of your heart Legs uncrossed Back supported Sit quietly and don't talk Place the cuff on your bare arm Adjust snuggly, so that only two fingertips can fit between your skin and the top of the cuff Check 2 readings separated by at least one minute Keep a log of your BP readings For a visual, please reference this diagram: http://ccnc.care/bpdiagram  Provider Name: [facility]     Phone: [phone]  Zone 1: ALL CLEAR  Continue to monitor your symptoms:  BP reading is less than 140 (top number) or less than 90 (bottom number)  No right upper stomach pain No headaches or seeing spots No feeling nauseated or throwing up No swelling in face and hands  Zone 2: CAUTION Call your doctor's office for any of the following:  BP reading is greater than 140 (top number) or greater than 90 (bottom number)  Stomach pain under your ribs in the middle or right side Headaches or seeing spots Feeling nauseated or throwing up Swelling in face and hands  Zone 3: EMERGENCY  Seek  immediate medical care if you have any of the following:  BP reading is greater than160 (top number) or greater than 110 (bottom number) Severe headaches not improving with Tylenol Serious difficulty catching your breath Any worsening symptoms from Zone 2   Second Trimester of Pregnancy The second trimester is from week 13 through week 28, months 4 through 6. The second trimester is often a time when you feel your best. Your body has also adjusted to being pregnant, and you begin to feel better physically. Usually, morning sickness has lessened or quit completely, you may have more energy, and you may have an increase in appetite. The second trimester is also a time when the fetus is growing rapidly. At the end of the sixth month, the fetus is about 9 inches long and weighs about 1 pounds. You will likely begin to feel the baby move (quickening) between 18 and 20 weeks of the pregnancy. BODY CHANGES Your body goes through many changes during pregnancy. The changes vary from woman to woman.  Your weight will continue to increase. You will notice your lower abdomen bulging out. You may begin to get stretch marks on your hips, abdomen, and breasts. You may develop headaches that can be relieved by medicines approved by your health care provider. You may urinate more often because the fetus is pressing on your bladder. You may develop or continue to have heartburn as a result of your pregnancy. You may develop constipation because certain hormones are causing the muscles that push waste through your intestines to slow down. You may develop hemorrhoids or swollen, bulging veins (varicose veins). You may have back pain because of the weight gain and pregnancy hormones relaxing your joints between the bones in your pelvis and as a result of a shift in weight and the muscles that support your balance. Your breasts will continue to grow and be tender. Your gums may bleed and may be sensitive to brushing  and flossing. Dark spots or blotches (chloasma, mask of pregnancy) may develop on your face. This will likely fade after the baby is born. A dark line from your belly button to the pubic area (linea nigra) may appear. This will likely fade after the baby is born. You may have changes in your hair. These can include thickening of your hair, rapid growth, and changes in texture. Some women also have hair loss during or after pregnancy, or hair that feels dry or thin. Your hair will most likely return to normal after your baby is born. WHAT TO EXPECT AT YOUR PRENATAL VISITS During a routine prenatal visit: You will be weighed to make sure you and the fetus are growing normally. Your blood pressure will be taken. Your abdomen will be measured to track your baby's growth. The fetal heartbeat will be listened to. Any test results from the previous visit will be discussed. Your health care provider  may ask you: How you are feeling. If you are feeling the baby move. If you have had any abnormal symptoms, such as leaking fluid, bleeding, severe headaches, or abdominal cramping. If you have any questions. Other tests that may be performed during your second trimester include: Blood tests that check for: Low iron levels (anemia). Gestational diabetes (between 24 and 28 weeks). Rh antibodies. Urine tests to check for infections, diabetes, or protein in the urine. An ultrasound to confirm the proper growth and development of the baby. An amniocentesis to check for possible genetic problems. Fetal screens for spina bifida and Down syndrome. HOME CARE INSTRUCTIONS  Avoid all smoking, herbs, alcohol, and unprescribed drugs. These chemicals affect the formation and growth of the baby. Follow your health care provider's instructions regarding medicine use. There are medicines that are either safe or unsafe to take during pregnancy. Exercise only as directed by your health care provider. Experiencing  uterine cramps is a good sign to stop exercising. Continue to eat regular, healthy meals. Wear a good support bra for breast tenderness. Do not use hot tubs, steam rooms, or saunas. Wear your seat belt at all times when driving. Avoid raw meat, uncooked cheese, cat litter boxes, and soil used by cats. These carry germs that can cause birth defects in the baby. Take your prenatal vitamins. Try taking a stool softener (if your health care provider approves) if you develop constipation. Eat more high-fiber foods, such as fresh vegetables or fruit and whole grains. Drink plenty of fluids to keep your urine clear or pale yellow. Take warm sitz baths to soothe any pain or discomfort caused by hemorrhoids. Use hemorrhoid cream if your health care provider approves. If you develop varicose veins, wear support hose. Elevate your feet for 15 minutes, 3-4 times a day. Limit salt in your diet. Avoid heavy lifting, wear low heel shoes, and practice good posture. Rest with your legs elevated if you have leg cramps or low back pain. Visit your dentist if you have not gone yet during your pregnancy. Use a soft toothbrush to brush your teeth and be gentle when you floss. A sexual relationship may be continued unless your health care provider directs you otherwise. Continue to go to all your prenatal visits as directed by your health care provider. SEEK MEDICAL CARE IF:  You have dizziness. You have mild pelvic cramps, pelvic pressure, or nagging pain in the abdominal area. You have persistent nausea, vomiting, or diarrhea. You have a bad smelling vaginal discharge. You have pain with urination. SEEK IMMEDIATE MEDICAL CARE IF:  You have a fever. You are leaking fluid from your vagina. You have spotting or bleeding from your vagina. You have severe abdominal cramping or pain. You have rapid weight gain or loss. You have shortness of breath with chest pain. You notice sudden or extreme swelling of your face,  hands, ankles, feet, or legs. You have not felt your baby move in over an hour. You have severe headaches that do not go away with medicine. You have vision changes. Document Released: 11/26/2001 Document Revised: 12/07/2013 Document Reviewed: 02/02/2013 [facility] Patient Information 2015 [place], [place]. This information is not intended to replace advice given to you by your health care provider. Make sure you discuss any questions you have with your health care provider.

## 2023-02-05 ENCOUNTER — Ambulatory Visit (INDEPENDENT_AMBULATORY_CARE_PROVIDER_SITE_OTHER): Payer: BC Managed Care – PPO | Admitting: Advanced Practice Midwife

## 2023-02-05 ENCOUNTER — Encounter: Payer: Self-pay | Admitting: Advanced Practice Midwife

## 2023-02-05 ENCOUNTER — Ambulatory Visit (INDEPENDENT_AMBULATORY_CARE_PROVIDER_SITE_OTHER): Payer: BC Managed Care – PPO

## 2023-02-05 ENCOUNTER — Other Ambulatory Visit: Payer: BC Managed Care – PPO

## 2023-02-05 VITALS — BP 129/78 | HR 84 | Wt 302.0 lb

## 2023-02-05 DIAGNOSIS — Z3A27 27 weeks gestation of pregnancy: Secondary | ICD-10-CM | POA: Diagnosis not present

## 2023-02-05 DIAGNOSIS — Z141 Cystic fibrosis carrier: Secondary | ICD-10-CM

## 2023-02-05 DIAGNOSIS — Z3403 Encounter for supervision of normal first pregnancy, third trimester: Secondary | ICD-10-CM

## 2023-02-05 DIAGNOSIS — O99213 Obesity complicating pregnancy, third trimester: Secondary | ICD-10-CM

## 2023-02-05 DIAGNOSIS — Z3A28 28 weeks gestation of pregnancy: Secondary | ICD-10-CM

## 2023-02-05 DIAGNOSIS — Z131 Encounter for screening for diabetes mellitus: Secondary | ICD-10-CM

## 2023-02-05 NOTE — Progress Notes (Signed)
[place] [id number] wks,cephalic,posterior placenta gr 0,normal ovaries,cx 3.5 cm,AFI 19.6 cm,FHR 144 bpm,EFW 1077 g 17%,FL 10.6%

## 2023-02-05 NOTE — Progress Notes (Signed)
   LOW-RISK PREGNANCY VISIT Patient name: Angelica Crawford MRN [id number]  Date of birth: [birth year] Chief Complaint:   Routine Prenatal Visit  History of Present Illness:   Angelica Crawford is a 37 y.o. G9P0000 female at 51w1dwith an Estimated Date of Delivery: 04/29/23 being seen today for ongoing management of a low-risk pregnancy.  Today she reports  doing well other than tired . denies leaking of fluid. Review of Systems:   Pertinent items are noted in HPI Denies abnormal vaginal discharge w/ itching/odor/irritation, headaches, visual changes, shortness of breath, chest pain, abdominal pain, severe nausea/vomiting, or problems with urination or bowel movements unless otherwise stated above. Pertinent History Reviewed:  Reviewed past medical,surgical, social, obstetrical and family history.  Reviewed problem list, medications and allergies. Physical Assessment:   Vitals:   02/05/23 0921  BP: 129/78  Pulse: 84  Weight: (!) 302 lb (137 kg)  Body mass index is 50.26 kg/m.        Physical Examination:   General appearance: Well appearing, and in no distress  Mental status: Alert, oriented to person, place, and time  Skin: Warm & dry  Cardiovascular: Normal heart rate noted  Respiratory: Normal respiratory effort, no distress  Abdomen: Soft, gravid, nontender  Pelvic: Cervical exam deferred         Extremities: Edema: Trace  Fetal Status: Fetal Heart Rate (bpm): 144 u/s   Movement: Present    Growth u/s: [id number],cephalic,posterior placenta gr 0,normal ovaries,cx 3.5 cm,AFI 19.6 cm,FHR 144 bpm,EFW 1077 g 17%,FL 10.6%   No results found for this or any previous visit (from the past 24 hour(s)).  Assessment & Plan:  1) Low-risk pregnancy G1P0000 at 256w1dith an Estimated Date of Delivery: 04/29/23   2) Anx/dep, doing well on Wellbutrin/Prozac  3) Obesity, growth 17th%; will continue q 4wk scans (next one scheduled); ante testing @ 36wks   Meds: No orders of the defined  types were placed in this encounter.  Labs/procedures today: PN2  Plan:  Continue routine obstetrical care   Reviewed: Preterm labor symptoms and general obstetric precautions including but not limited to vaginal bleeding, contractions, leaking of fluid and fetal movement were reviewed in detail with the patient.  All questions were answered. Has home bp cuff. Check bp weekly, let us[place]ow if >140/90.   Follow-up: Return for 2wk LROB (wants virtual), then 4wk LROB w growth u/s.  Orders Placed This Encounter  Procedures   [place] Follow Up   [facility]/21/2024 9:46 AM

## 2023-02-05 NOTE — Patient Instructions (Signed)
Caitlyne, thank you for choosing our office today! We appreciate the opportunity to meet your healthcare needs. You may receive a short survey by mail, e-mail, or through [employer]. If you are happy with your care we would appreciate if you could take just a few minutes to complete the survey questions. We read all of your comments and take your feedback very seriously. Thank you again for choosing our office.  Center for [employer] Team at [place] at [facility] ([place], [place] [address]) Entrance C, located off of [place] parking   CLASSES: Go to [email or web address] to register for classes (childbirth, breastfeeding, waterbirth, infant CPR, daddy bootcamp, etc.)  Call the office [phone]) or go to [facility] if: You begin to have strong, frequent contractions Your water breaks.  Sometimes it is a big gush of fluid, sometimes it is just a trickle that keeps getting your panties wet or running down your legs You have vaginal bleeding.  It is normal to have a small amount of spotting if your cervix was checked.  You don't feel your baby moving like normal.  If you don't, get you something to eat and drink and lay down and focus on feeling your baby move.   If your baby is still not moving like normal, you should call the office or go to [facility].  Call the office [phone]) or go to [facility] hospital for these signs of pre-eclampsia: Severe headache that does not go away with Tylenol Visual changes- seeing spots, double, blurred vision Pain under your right breast or upper abdomen that does not go away with Tums or heartburn medicine Nausea and/or vomiting Severe swelling in your hands, feet, and face   Tdap Vaccine It is recommended that you get the Tdap vaccine during the third trimester of EACH pregnancy to help protect your baby from getting pertussis (whooping cough) 27-36 weeks is the BEST time to do  this so that you can pass the protection on to your baby. During pregnancy is better than after pregnancy, but if you are unable to get it during pregnancy it will be offered at the hospital.  You can get this vaccine with [place], at the health department, your family doctor, or some local pharmacies Everyone who will be around your baby should also be up-to-date on their vaccines before the baby comes. Adults (who are not pregnant) only need 1 dose of Tdap during adulthood.   [facility] [place] Pediatricians/Family Doctors [facility]): [address] [place], [facility]: [address] [place], [phone]                [facility]): [place], [phone] (call to ask if accepting patients) [facility] Department: [address], [place], [place] Pediatricians/Family Doctors [facility]): [place]. [place], [address], [facility]: [address] [place], [place] [facility] [place]: [place], [facility]): [phone] [facility]: [address] [place]: [address] [place] [facility]: [phone], [phone]  Home Blood Pressure Monitoring for Patients   Your provider has recommended that you check your  blood pressure (BP) at least once a week at home. If you do not have a blood pressure cuff at home, one will be provided for you. Contact your provider if you have not received your monitor within 1 week.   Helpful Tips for Accurate Home Blood Pressure Checks  Don't smoke, exercise, or drink caffeine 30 minutes before checking your BP Use the restroom before checking your BP (a full bladder can raise your  pressure) Relax in a comfortable upright chair Feet on the ground Left arm resting comfortably on a flat surface at the level of your heart Legs uncrossed Back supported Sit quietly and don't talk Place the cuff on your bare arm Adjust snuggly, so that only two fingertips can fit between your skin and the top of the cuff Check 2 readings separated by at least one minute Keep a log of your BP readings For a visual, please reference this diagram: http://ccnc.care/bpdiagram  Provider Name: [facility]     Phone: [phone]  Zone 1: ALL CLEAR  Continue to monitor your symptoms:  BP reading is less than 140 (top number) or less than 90 (bottom number)  No right upper stomach pain No headaches or seeing spots No feeling nauseated or throwing up No swelling in face and hands  Zone 2: CAUTION Call your doctor's office for any of the following:  BP reading is greater than 140 (top number) or greater than 90 (bottom number)  Stomach pain under your ribs in the middle or right side Headaches or seeing spots Feeling nauseated or throwing up Swelling in face and hands  Zone 3: EMERGENCY  Seek immediate medical care if you have any of the following:  BP reading is greater than160 (top number) or greater than 110 (bottom number) Severe headaches not improving with Tylenol Serious difficulty catching your breath Any worsening symptoms from Zone 2   Third Trimester of Pregnancy The third trimester is from week 29 through week 42, months 7 through 9. The third trimester is a time when the fetus is growing rapidly. At the end of the ninth month, the fetus is about 20 inches in length and weighs 6-10 pounds.  BODY CHANGES Your body goes through many changes during pregnancy. The changes vary from woman to woman.  Your weight will continue to increase. You can expect to gain 25-35 pounds (11-16 kg) by the end of the pregnancy. You may begin to get stretch marks on your hips, abdomen,  and breasts. You may urinate more often because the fetus is moving lower into your pelvis and pressing on your bladder. You may develop or continue to have heartburn as a result of your pregnancy. You may develop constipation because certain hormones are causing the muscles that push waste through your intestines to slow down. You may develop hemorrhoids or swollen, bulging veins (varicose veins). You may have pelvic pain because of the weight gain and pregnancy hormones relaxing your joints between the bones in your pelvis. Backaches may result from overexertion of the muscles supporting your posture. You may have changes in your hair. These can include thickening of your hair, rapid growth, and changes in texture. Some women also have hair loss during or after pregnancy, or hair that feels dry or thin. Your hair will most likely return to normal after your baby is born. Your breasts will continue to grow and be tender. A yellow discharge may leak from your breasts called colostrum. Your belly button may stick out. You may   feel short of breath because of your expanding uterus. You may notice the fetus "dropping," or moving lower in your abdomen. You may have a bloody mucus discharge. This usually occurs a few days to a week before labor begins. Your cervix becomes thin and soft (effaced) near your due date. WHAT TO EXPECT AT YOUR PRENATAL EXAMS  You will have prenatal exams every 2 weeks until week 36. Then, you will have weekly prenatal exams. During a routine prenatal visit: You will be weighed to make sure you and the fetus are growing normally. Your blood pressure is taken. Your abdomen will be measured to track your baby's growth. The fetal heartbeat will be listened to. Any test results from the previous visit will be discussed. You may have a cervical check near your due date to see if you have effaced. At around 36 weeks, your caregiver will check your cervix. At the same time, your  caregiver will also perform a test on the secretions of the vaginal tissue. This test is to determine if a type of bacteria, Group B streptococcus, is present. Your caregiver will explain this further. Your caregiver may ask you: What your birth plan is. How you are feeling. If you are feeling the baby move. If you have had any abnormal symptoms, such as leaking fluid, bleeding, severe headaches, or abdominal cramping. If you have any questions. Other tests or screenings that may be performed during your third trimester include: Blood tests that check for low iron levels (anemia). Fetal testing to check the health, activity level, and growth of the fetus. Testing is done if you have certain medical conditions or if there are problems during the pregnancy. FALSE LABOR You may feel small, irregular contractions that eventually go away. These are called Braxton Hicks contractions, or false labor. Contractions may last for hours, days, or even weeks before true labor sets in. If contractions come at regular intervals, intensify, or become painful, it is best to be seen by your caregiver.  SIGNS OF LABOR  Menstrual-like cramps. Contractions that are 5 minutes apart or less. Contractions that start on the top of the uterus and spread down to the lower abdomen and back. A sense of increased pelvic pressure or back pain. A watery or bloody mucus discharge that comes from the vagina. If you have any of these signs before the 37th week of pregnancy, call your caregiver right away. You need to go to the hospital to get checked immediately. HOME CARE INSTRUCTIONS  Avoid all smoking, herbs, alcohol, and unprescribed drugs. These chemicals affect the formation and growth of the baby. Follow your caregiver's instructions regarding medicine use. There are medicines that are either safe or unsafe to take during pregnancy. Exercise only as directed by your caregiver. Experiencing uterine cramps is a good sign to  stop exercising. Continue to eat regular, healthy meals. Wear a good support bra for breast tenderness. Do not use hot tubs, steam rooms, or saunas. Wear your seat belt at all times when driving. Avoid raw meat, uncooked cheese, cat litter boxes, and soil used by cats. These carry germs that can cause birth defects in the baby. Take your prenatal vitamins. Try taking a stool softener (if your caregiver approves) if you develop constipation. Eat more high-fiber foods, such as fresh vegetables or fruit and whole grains. Drink plenty of fluids to keep your urine clear or pale yellow. Take warm sitz baths to soothe any pain or discomfort caused by hemorrhoids. Use hemorrhoid cream if   your caregiver approves. If you develop varicose veins, wear support hose. Elevate your feet for 15 minutes, 3-4 times a day. Limit salt in your diet. Avoid heavy lifting, wear low heal shoes, and practice good posture. Rest a lot with your legs elevated if you have leg cramps or low back pain. Visit your dentist if you have not gone during your pregnancy. Use a soft toothbrush to brush your teeth and be gentle when you floss. A sexual relationship may be continued unless your caregiver directs you otherwise. Do not travel far distances unless it is absolutely necessary and only with the approval of your caregiver. Take prenatal classes to understand, practice, and ask questions about the labor and delivery. Make a trial run to the hospital. Pack your hospital bag. Prepare the baby's nursery. Continue to go to all your prenatal visits as directed by your caregiver. SEEK MEDICAL CARE IF: You are unsure if you are in labor or if your water has broken. You have dizziness. You have mild pelvic cramps, pelvic pressure, or nagging pain in your abdominal area. You have persistent nausea, vomiting, or diarrhea. You have a bad smelling vaginal discharge. You have pain with urination. SEEK IMMEDIATE MEDICAL CARE IF:  You  have a fever. You are leaking fluid from your vagina. You have spotting or bleeding from your vagina. You have severe abdominal cramping or pain. You have rapid weight loss or gain. You have shortness of breath with chest pain. You notice sudden or extreme swelling of your face, hands, ankles, feet, or legs. You have not felt your baby move in over an hour. You have severe headaches that do not go away with medicine. You have vision changes. Document Released: 11/26/2001 Document Revised: 12/07/2013 Document Reviewed: 02/02/2013 [facility] Patient Information 2015 [place], [place]. This information is not intended to replace advice given to you by your health care provider. Make sure you discuss any questions you have with your health care provider.

## 2023-02-06 LAB — CBC
Hematocrit: 38.4 % (ref 34.0–46.6)
Hemoglobin: 12.8 g/dL (ref 11.1–15.9)
MCH: 30 pg (ref 26.6–33.0)
MCHC: 33.3 g/dL (ref 31.5–35.7)
MCV: 90 fL (ref 79–97)
Platelets: 192 10*3/uL (ref 150–450)
RBC: 4.27 x10E6/uL (ref 3.77–5.28)
RDW: 13.1 % (ref 11.7–15.4)
WBC: 5.2 10*3/uL (ref 3.4–10.8)

## 2023-02-06 LAB — GLUCOSE TOLERANCE, 2 HOURS W/ 1HR
Glucose, 1 hour: 158 mg/dL (ref 70–179)
Glucose, 2 hour: 76 mg/dL (ref 70–152)
Glucose, Fasting: 92 mg/dL — ABNORMAL HIGH (ref 70–91)

## 2023-02-06 LAB — ANTIBODY SCREEN: Antibody Screen: NEGATIVE

## 2023-02-06 LAB — HIV ANTIBODY (ROUTINE TESTING W REFLEX): HIV Screen 4th Generation wRfx: NONREACTIVE

## 2023-02-06 LAB — RPR: RPR Ser Ql: NONREACTIVE

## 2023-02-07 ENCOUNTER — Other Ambulatory Visit: Payer: Self-pay | Admitting: *Deleted

## 2023-02-07 ENCOUNTER — Encounter: Payer: Self-pay | Admitting: *Deleted

## 2023-02-07 ENCOUNTER — Encounter: Payer: Self-pay | Admitting: Advanced Practice Midwife

## 2023-02-07 DIAGNOSIS — O24410 Gestational diabetes mellitus in pregnancy, diet controlled: Secondary | ICD-10-CM

## 2023-02-07 DIAGNOSIS — O24419 Gestational diabetes mellitus in pregnancy, unspecified control: Secondary | ICD-10-CM | POA: Insufficient documentation

## 2023-02-07 MED ORDER — ACCU-CHEK GUIDE ME W/DEVICE KIT: 1.0000 | PACK | Freq: Four times a day (QID) | 0 refills | Status: DC

## 2023-02-07 MED ORDER — ACCU-CHEK GUIDE VI STRP: ORAL_STRIP | 12 refills | Status: DC

## 2023-02-07 MED ORDER — ACCU-CHEK SOFTCLIX LANCETS MISC: 12 refills | Status: DC

## 2023-02-10 ENCOUNTER — Telehealth: Payer: Self-pay | Admitting: Advanced Practice Midwife

## 2023-02-10 NOTE — Telephone Encounter (Signed)
Patient is wanting to get started on fmla. She is [redacted] weeks pregnant. Please advise.

## 2023-02-10 NOTE — Telephone Encounter (Signed)
Returned patient's call.  States she is "done" and ready to begin her leave.  States the elevator is out at work and she is tired of climbing stairs and is just exhausted. Discussed with patient that there would need to be a medical diagnosis that would qualify her for disability and having GDM does not necessarily indicate that. States she does having short term disability but has not spoken with them yet as far as what she qualifies for. Advised patient to contact them and to discuss with provider at her visit next week. Pt verbalized understanding with no further questions.

## 2023-02-13 ENCOUNTER — Other Ambulatory Visit: Payer: BC Managed Care – PPO

## 2023-02-19 ENCOUNTER — Ambulatory Visit (INDEPENDENT_AMBULATORY_CARE_PROVIDER_SITE_OTHER): Payer: BC Managed Care – PPO | Admitting: Obstetrics and Gynecology

## 2023-02-19 ENCOUNTER — Encounter: Payer: Self-pay | Admitting: Obstetrics and Gynecology

## 2023-02-19 ENCOUNTER — Encounter: Payer: BC Managed Care – PPO | Admitting: Advanced Practice Midwife

## 2023-02-19 VITALS — BP 144/83 | HR 97 | Wt 309.2 lb

## 2023-02-19 DIAGNOSIS — O24410 Gestational diabetes mellitus in pregnancy, diet controlled: Secondary | ICD-10-CM

## 2023-02-19 DIAGNOSIS — Z6841 Body Mass Index (BMI) 40.0 and over, adult: Secondary | ICD-10-CM

## 2023-02-19 DIAGNOSIS — F3289 Other specified depressive episodes: Secondary | ICD-10-CM

## 2023-02-19 DIAGNOSIS — Z141 Cystic fibrosis carrier: Secondary | ICD-10-CM

## 2023-02-19 DIAGNOSIS — O0993 Supervision of high risk pregnancy, unspecified, third trimester: Secondary | ICD-10-CM

## 2023-02-19 DIAGNOSIS — Z3A30 30 weeks gestation of pregnancy: Secondary | ICD-10-CM

## 2023-02-19 NOTE — Progress Notes (Signed)
Subjective:  Angelica Crawford is a 36 y.o. G1P0000 at 37w1dbeing seen today for ongoing prenatal care.  She is currently monitored for the following issues for this high-risk pregnancy and has Encounter for supervision of high risk pregnancy in third trimester, antepartum; Anxiety; Depression; Migraines; Morbid obesity with BMI of 40.0-44.9, adult ([place]; Cystic fibrosis carrier; and GDM (gestational diabetes mellitus) on their problem list.  Patient reports  general discomforts of pregnancy. Desires to start FLMA from work .  Contractions: Not present. Vag. Bleeding: None.  Movement: Present. Denies leaking of fluid.   The following portions of the patient's history were reviewed and updated as appropriate: allergies, current medications, past family history, past medical history, past social history, past surgical history and problem list. Problem list updated.  Objective:   Vitals:   02/19/23 1444 02/19/23 1448  BP: (!) 146/85 (!) 144/83  Pulse: 96 97  Weight: (!) 309 lb 3.2 oz (140.3 kg)     Fetal Status:     Movement: Present     General:  Alert, oriented and cooperative. Patient is in no acute distress.  Skin: Skin is warm and dry. No rash noted.   Cardiovascular: Normal heart rate noted  Respiratory: Normal respiratory effort, no problems with respiration noted  Abdomen: Soft, gravid, appropriate for gestational age. Pain/Pressure: Absent     Pelvic:  Cervical exam deferred        Extremities: Normal range of motion.     Mental Status: Normal mood and affect. Normal behavior. Normal judgment and thought content.   Urinalysis:      Assessment and Plan:  Pregnancy: G1P0000 at 367w1d1. Encounter for supervision of high risk pregnancy in third trimester, antepartum Stable   2. Diet controlled gestational diabetes mellitus (GDM) in third trimester CBG's in goal range per pt   3. Cystic fibrosis carrier Stable  4. Other depression Stable on current medications  5.  Morbid obesity with BMI of 40.0-44.9, adult ([place] growth scans  Pt to bring in FLMA paper work to start now vs post partum Pt advised to check with her HR department to be sure she understands what starting FLMA now means Pt verbalized understanding  Preterm labor symptoms and general obstetric precautions including but not limited to vaginal bleeding, contractions, leaking of fluid and fetal movement were reviewed in detail with the patient. Please refer to After Visit Summary for other counseling recommendations.  Return in about 2 weeks (around 03/05/2023) for OB visit, face to face, any provider.   ErChancy MilroyMD

## 2023-03-05 ENCOUNTER — Ambulatory Visit (INDEPENDENT_AMBULATORY_CARE_PROVIDER_SITE_OTHER): Payer: BC Managed Care – PPO | Admitting: Advanced Practice Midwife

## 2023-03-05 ENCOUNTER — Ambulatory Visit (INDEPENDENT_AMBULATORY_CARE_PROVIDER_SITE_OTHER): Payer: BC Managed Care – PPO

## 2023-03-05 VITALS — BP 133/89 | HR 97 | Wt 312.6 lb

## 2023-03-05 DIAGNOSIS — O133 Gestational [pregnancy-induced] hypertension without significant proteinuria, third trimester: Secondary | ICD-10-CM

## 2023-03-05 DIAGNOSIS — O139 Gestational [pregnancy-induced] hypertension without significant proteinuria, unspecified trimester: Secondary | ICD-10-CM | POA: Insufficient documentation

## 2023-03-05 DIAGNOSIS — Z3A32 32 weeks gestation of pregnancy: Secondary | ICD-10-CM | POA: Diagnosis not present

## 2023-03-05 DIAGNOSIS — O0993 Supervision of high risk pregnancy, unspecified, third trimester: Secondary | ICD-10-CM

## 2023-03-05 DIAGNOSIS — O24410 Gestational diabetes mellitus in pregnancy, diet controlled: Secondary | ICD-10-CM

## 2023-03-05 DIAGNOSIS — O99213 Obesity complicating pregnancy, third trimester: Secondary | ICD-10-CM | POA: Diagnosis not present

## 2023-03-05 DIAGNOSIS — Z141 Cystic fibrosis carrier: Secondary | ICD-10-CM

## 2023-03-05 NOTE — Progress Notes (Signed)
[place] [id number] wks,cephalic,posterior placenta gr 1,AFI 17.8 cm,FHR 148 bpm,EFW 1714 g 15%,FL 2.8%

## 2023-03-05 NOTE — Progress Notes (Signed)
HIGH-RISK PREGNANCY VISIT Patient name: Angelica Crawford MRN [id number]  Date of birth: [birth year] Chief Complaint:   Routine Prenatal Visit  History of Present Illness:   Angelica Crawford is a 37 y.o. G80P0000 female at [redacted]w[redacted]d with an Estimated Date of Delivery: 04/29/23 being seen today for ongoing management of a high-risk pregnancy complicated by diabetes mellitus A1DM and gestational hypertension dx TODAY (had mild range elevations @ 30wks) currently on no meds. Fasting values have all been <85 and PP values all <120; has DM ed appt coming up. Denies s/s pre-e. Is out of work by her choice on FMLA and is doing better overall.  Today she reports no complaints. Contractions: Not present. Vag. Bleeding: None.  Movement: Present. denies leaking of fluid.      10/28/2022    1:45 PM  Depression screen PHQ 2/9  Decreased Interest 0  Down, Depressed, Hopeless 0  PHQ - 2 Score 0  Altered sleeping 0  Tired, decreased energy 2  Change in appetite 2  Feeling bad or failure about yourself  0  Trouble concentrating 0  Moving slowly or fidgety/restless 0  Suicidal thoughts 0  PHQ-9 Score 4        10/28/2022    1:46 PM  GAD 7 : Generalized Anxiety Score  Nervous, Anxious, on Edge 0  Control/stop worrying 0  Worry too much - different things 0  Trouble relaxing 0  Restless 0  Easily annoyed or irritable 0  Afraid - awful might happen 0  Total GAD 7 Score 0     Review of Systems:   Pertinent items are noted in HPI Denies abnormal vaginal discharge w/ itching/odor/irritation, headaches, visual changes, shortness of breath, chest pain, abdominal pain, severe nausea/vomiting, or problems with urination or bowel movements unless otherwise stated above. Pertinent History Reviewed:  Reviewed past medical,surgical, social, obstetrical and family history.  Reviewed problem list, medications and allergies. Physical Assessment:   Vitals:   03/05/23 1454 03/05/23 1457  BP: (!) 142/89  133/89  Pulse: (!) 103 97  Weight:  (!) 312 lb 9.6 oz (141.8 kg)  Body mass index is 52.02 kg/m.           Physical Examination:   General appearance: alert, well appearing, and in no distress  Mental status: alert, oriented to person, place, and time  Skin: warm & dry   Extremities:      Cardiovascular: normal heart rate noted  Respiratory: normal respiratory effort, no distress  Abdomen: gravid, soft, non-tender  Pelvic: Cervical exam deferred         Fetal Status: Fetal Heart Rate (bpm): 148 u/s   Movement: Present    Fetal Surveillance Testing today: [place] [id number] wks,cephalic,posterior placenta gr 1,AFI 17.8 cm,FHR 148 bpm,EFW 1714 g 15%,FL 2.8%     No results found for this or any previous visit (from the past 24 hour(s)).  Assessment & Plan:  High-risk pregnancy: G1P0000 at [redacted]w[redacted]d with an Estimated Date of Delivery: 04/29/23   1) GDMA1, stable; had diabetic ed coming up but has been getting nl values at home; EFW 15th% today with growth ordered for 4wks  2) New onset gHTN, dx today by second set of elevated values (144/83 & 146/85 @ 30wks); asymptomatic; baseline labs today; has BP cuff at home; will start 2x/wk testing next week with growth in 4wks  Meds: No orders of the defined types were placed in this encounter.   Labs/procedures today: U/S and labs  Treatment Plan:  start 2x/wk testing next week and growth scan in 4wks (orders in); IOL 37-39wk  Reviewed: Preterm labor symptoms and general obstetric precautions including but not limited to vaginal bleeding, contractions, leaking of fluid and fetal movement were reviewed in detail with the patient.  All questions were answered. Does have home bp cuff. Office bp cuff given: not applicable. Check bp twice daily, let us know if consistently >140 and/or >90.  Follow-up: Return for next week start weekly BPP & NST w HROB visits; growth u/s in 4wks.   No future appointments.   Orders Placed This Encounter  Procedures   US  FETAL BPP WO NON STRESS   US OB Follow Up   Comp Met (CMET)   Protein / creatinine ratio, urine   CBC   Myrtis Ser [facility] 03/05/2023 6:35 PM

## 2023-03-06 LAB — COMPREHENSIVE METABOLIC PANEL
ALT: 49 IU/L — ABNORMAL HIGH (ref 0–32)
AST: 26 IU/L (ref 0–40)
Albumin/Globulin Ratio: 0.9 — ABNORMAL LOW (ref 1.2–2.2)
Albumin: 3 g/dL — ABNORMAL LOW (ref 3.9–4.9)
Alkaline Phosphatase: 113 IU/L (ref 44–121)
BUN/Creatinine Ratio: 15 (ref 9–23)
BUN: 11 mg/dL (ref 6–20)
Bilirubin Total: 0.2 mg/dL (ref 0.0–1.2)
CO2: 20 mmol/L (ref 20–29)
Calcium: 8.8 mg/dL (ref 8.7–10.2)
Chloride: 104 mmol/L (ref 96–106)
Creatinine, Ser: 0.71 mg/dL (ref 0.57–1.00)
Globulin, Total: 3.3 g/dL (ref 1.5–4.5)
Glucose: 82 mg/dL (ref 70–99)
Potassium: 4.3 mmol/L (ref 3.5–5.2)
Sodium: 137 mmol/L (ref 134–144)
Total Protein: 6.3 g/dL (ref 6.0–8.5)
eGFR: 112 mL/min/{1.73_m2} (ref >59)

## 2023-03-06 LAB — PROTEIN / CREATININE RATIO, URINE
Creatinine, Urine: 179.6 mg/dL
Protein, Ur: 16.8 mg/dL
Protein/Creat Ratio: 94 mg/g creat (ref 0–200)

## 2023-03-06 LAB — CBC
Hematocrit: 39.6 % (ref 34.0–46.6)
Hemoglobin: 12.9 g/dL (ref 11.1–15.9)
MCH: 29.2 pg (ref 26.6–33.0)
MCHC: 32.6 g/dL (ref 31.5–35.7)
MCV: 90 fL (ref 79–97)
Platelets: 263 10*3/uL (ref 150–450)
RBC: 4.42 x10E6/uL (ref 3.77–5.28)
RDW: 13.9 % (ref 11.7–15.4)
WBC: 9.7 10*3/uL (ref 3.4–10.8)

## 2023-03-10 ENCOUNTER — Ambulatory Visit (INDEPENDENT_AMBULATORY_CARE_PROVIDER_SITE_OTHER): Payer: BC Managed Care – PPO | Admitting: *Deleted

## 2023-03-10 VITALS — BP 130/83 | HR 91 | Wt 316.4 lb

## 2023-03-10 DIAGNOSIS — O0993 Supervision of high risk pregnancy, unspecified, third trimester: Secondary | ICD-10-CM

## 2023-03-10 DIAGNOSIS — Z3A32 32 weeks gestation of pregnancy: Secondary | ICD-10-CM | POA: Diagnosis not present

## 2023-03-10 DIAGNOSIS — O133 Gestational [pregnancy-induced] hypertension without significant proteinuria, third trimester: Secondary | ICD-10-CM

## 2023-03-10 DIAGNOSIS — O24410 Gestational diabetes mellitus in pregnancy, diet controlled: Secondary | ICD-10-CM

## 2023-03-10 NOTE — Progress Notes (Unsigned)
   NURSE VISIT- NST  SUBJECTIVE:  Angelica Crawford is a 37 y.o. G1P0000 female at [redacted]w[redacted]d, here for a NST for pregnancy complicated by Diabetes: A1DM} and GHTN.  She reports active fetal movement, contractions: none, vaginal bleeding: none, membranes: intact.   OBJECTIVE:  BP 130/83   Pulse 91   Wt (!) 316 lb 6.4 oz (143.5 kg)   LMP 07/23/2022 (Approximate)   BMI 52.65 kg/m   Appears well, no apparent distress  No results found for this or any previous visit (from the past 24 hour(s)).  NST: FHR baseline 145 bpm, Variability: moderate, Accelerations:present, Decelerations:  Absent= Cat 1/reactive Toco: none   ASSESSMENT: G1P0000 at [redacted]w[redacted]d with Diabetes: A1DM} and GHTN NST reactive  PLAN: EFM strip reviewed by Dr. Nelda Marseille   Recommendations: keep next appointment as scheduled    Alice Rieger  03/10/2023 11:34 AM

## 2023-03-12 ENCOUNTER — Other Ambulatory Visit: Payer: Self-pay | Admitting: Advanced Practice Midwife

## 2023-03-12 DIAGNOSIS — O133 Gestational [pregnancy-induced] hypertension without significant proteinuria, third trimester: Secondary | ICD-10-CM

## 2023-03-13 ENCOUNTER — Ambulatory Visit (INDEPENDENT_AMBULATORY_CARE_PROVIDER_SITE_OTHER): Payer: BC Managed Care – PPO | Admitting: Advanced Practice Midwife

## 2023-03-13 ENCOUNTER — Ambulatory Visit (INDEPENDENT_AMBULATORY_CARE_PROVIDER_SITE_OTHER): Payer: BC Managed Care – PPO

## 2023-03-13 ENCOUNTER — Encounter: Payer: Self-pay | Admitting: Advanced Practice Midwife

## 2023-03-13 VITALS — BP 129/87 | HR 94 | Wt 317.2 lb

## 2023-03-13 DIAGNOSIS — O133 Gestational [pregnancy-induced] hypertension without significant proteinuria, third trimester: Secondary | ICD-10-CM

## 2023-03-13 DIAGNOSIS — Z23 Encounter for immunization: Secondary | ICD-10-CM | POA: Diagnosis not present

## 2023-03-13 DIAGNOSIS — O403XX0 Polyhydramnios, third trimester, not applicable or unspecified: Secondary | ICD-10-CM

## 2023-03-13 DIAGNOSIS — Z3A33 33 weeks gestation of pregnancy: Secondary | ICD-10-CM | POA: Diagnosis not present

## 2023-03-13 DIAGNOSIS — O24410 Gestational diabetes mellitus in pregnancy, diet controlled: Secondary | ICD-10-CM

## 2023-03-13 DIAGNOSIS — O0993 Supervision of high risk pregnancy, unspecified, third trimester: Secondary | ICD-10-CM

## 2023-03-13 DIAGNOSIS — O409XX0 Polyhydramnios, unspecified trimester, not applicable or unspecified: Secondary | ICD-10-CM | POA: Insufficient documentation

## 2023-03-13 DIAGNOSIS — Z141 Cystic fibrosis carrier: Secondary | ICD-10-CM

## 2023-03-13 DIAGNOSIS — O2603 Excessive weight gain in pregnancy, third trimester: Secondary | ICD-10-CM

## 2023-03-13 DIAGNOSIS — O2600 Excessive weight gain in pregnancy, unspecified trimester: Secondary | ICD-10-CM | POA: Insufficient documentation

## 2023-03-13 NOTE — Progress Notes (Signed)
HIGH-RISK PREGNANCY VISIT Patient name: Angelica Crawford MRN [id number]  Date of birth: [birth year] Chief Complaint:   Routine Prenatal Visit, High Risk Gestation, and Pregnancy Ultrasound  History of Present Illness:   Angelica Crawford is a 37 y.o. G38P0000 female at [redacted]w[redacted]d with an Estimated Date of Delivery: 04/29/23 being seen today for ongoing management of a high-risk pregnancy complicated by diabetes mellitus A1DM, gestational hypertension currently on no meds, and morbid obesity BMI >=40.    Today she reports FBS <95 and 2hr pp <120.  However, she tested 45 minutes afer eating french toast one time, it was 212. Contractions: Irregular.  .  Movement: Present. denies leaking of fluid.   BPs at home all <138/92    10/28/2022    1:45 PM  Depression screen PHQ 2/9  Decreased Interest 0  Down, Depressed, Hopeless 0  PHQ - 2 Score 0  Altered sleeping 0  Tired, decreased energy 2  Change in appetite 2  Feeling bad or failure about yourself  0  Trouble concentrating 0  Moving slowly or fidgety/restless 0  Suicidal thoughts 0  PHQ-9 Score 4        10/28/2022    1:46 PM  GAD 7 : Generalized Anxiety Score  Nervous, Anxious, on Edge 0  Control/stop worrying 0  Worry too much - different things 0  Trouble relaxing 0  Restless 0  Easily annoyed or irritable 0  Afraid - awful might happen 0  Total GAD 7 Score 0     Review of Systems:   Pertinent items are noted in HPI Denies abnormal vaginal discharge w/ itching/odor/irritation, headaches, visual changes, shortness of breath, chest pain, abdominal pain, severe nausea/vomiting, or problems with urination or bowel movements unless otherwise stated above. Pertinent History Reviewed:  Reviewed past medical,surgical, social, obstetrical and family history.  Reviewed problem list, medications and allergies. Physical Assessment:   Vitals:   03/13/23 0916  BP: 129/87  Pulse: 94  Weight: (!) 317 lb 3.2 oz (143.9 kg)  Body mass  index is 52.78 kg/m.           Physical Examination:   General appearance: alert, well appearing, and in no distress  Mental status: alert, oriented to person, place, and time  Skin: warm & dry   Extremities: Edema: Trace    Cardiovascular: normal heart rate noted  Respiratory: normal respiratory effort, no distress  Abdomen: gravid, soft, non-tender  Pelvic: Cervical exam deferred         Fetal Status:     Movement: Present    Fetal Surveillance Testing today: [place] [id number] wks,cephalic,polyhydramnios,AFI 28 cm,BPP 8/8,posterior placenta gr 3,FHR 144 bpm,RI .62,.63,.58=56%    Chaperone: N/A   Assessment & Plan:  High-risk pregnancy: G1P0000 at [redacted]w[redacted]d with an Estimated Date of Delivery: 04/29/23   1. Encounter for supervision of high risk pregnancy in third trimester, antepartum   2. Excessive weight gain during pregnancy in third trimester Risks/strategies to avoid further weight gain disucssed  3. [redacted] weeks gestation of pregnancy   5. Morbid obesity (HCC) BMI 52.78  6. Diet controlled gestational diabetes mellitus (GDM) in third trimester Avoid high carb meals  7. Gestational hypertension, third trimester Continue twice weekly testing Check bp daily, let us know if consistently >150 and/or >95.  8. Polyhydramnios in third trimester complication, single or unspecified fetus AFI weekly    Meds: No orders of the defined types were placed in this encounter.   Orders: No orders of the  defined types were placed in this encounter.    Labs/procedures today: BPP, dopplers   Reviewed: Preterm labor symptoms and general obstetric precautions including but not limited to vaginal bleeding, contractions, leaking of fluid and fetal movement were reviewed in detail with the patient.  All questions were answered.   Follow-up: Return for may cancel appts after 4/30.   Future Appointments  Date Time Provider [place]  03/17/2023 10:30 AM [facility] NURSE [facility]   03/20/2023 10:45 AM [facility] [place] [facility] None  03/20/2023 11:50 AM Florian Buff, MD [facility]  03/24/2023 10:30 AM [facility] NURSE [facility]  03/27/2023  8:30 AM [facility] [place] [facility] None  03/27/2023  9:30 AM Christin Fudge, CNM [facility]  03/31/2023 10:30 AM [facility] NURSE [facility]  04/03/2023 10:45 AM [facility] [place] [facility] None  04/03/2023 11:50 AM Janyth Pupa, DO [facility]  04/07/2023 10:10 AM [facility] NURSE [facility]  04/10/2023  8:30 AM [facility] [place] [facility] None  04/10/2023  9:30 AM Florian Buff, MD [facility]  04/14/2023 10:10 AM [facility] NURSE [facility]  04/17/2023  8:30 AM [facility] [place] [facility] None  04/17/2023  9:50 AM Janyth Pupa, DO [facility]  04/21/2023 10:30 AM [facility] NURSE [facility]  04/24/2023  8:30 AM [facility] [place] [facility] None  04/24/2023  9:30 AM Florian Buff, MD [facility]  04/28/2023 10:10 AM [facility] NURSE [facility]    No orders of the defined types were placed in this encounter.  Christin Fudge , DNP, [facility] 03/13/2023 10:02 AM

## 2023-03-13 NOTE — Addendum Note (Signed)
Addended by: Diona Fanti A on: 03/13/2023 10:05 AM   Modules accepted: Orders

## 2023-03-13 NOTE — Progress Notes (Signed)
[place] [id number] wks,cephalic,polyhydramnios,AFI 28 cm,BPP 8/8,posterior placenta gr 3,FHR 144 bpm,RI .62,.63,.58=56%

## 2023-03-17 ENCOUNTER — Ambulatory Visit (INDEPENDENT_AMBULATORY_CARE_PROVIDER_SITE_OTHER): Payer: BC Managed Care – PPO | Admitting: *Deleted

## 2023-03-17 VITALS — BP 113/76 | HR 84 | Wt 311.2 lb

## 2023-03-17 DIAGNOSIS — O24410 Gestational diabetes mellitus in pregnancy, diet controlled: Secondary | ICD-10-CM | POA: Diagnosis not present

## 2023-03-17 DIAGNOSIS — O133 Gestational [pregnancy-induced] hypertension without significant proteinuria, third trimester: Secondary | ICD-10-CM

## 2023-03-17 DIAGNOSIS — Z3A33 33 weeks gestation of pregnancy: Secondary | ICD-10-CM

## 2023-03-17 DIAGNOSIS — O0993 Supervision of high risk pregnancy, unspecified, third trimester: Secondary | ICD-10-CM | POA: Diagnosis not present

## 2023-03-17 NOTE — Progress Notes (Addendum)
   NURSE VISIT- NST  SUBJECTIVE:  Angelica Crawford is a 36 y.o. G1P0000 female at [redacted]w[redacted]d, here for a NST for pregnancy complicated by Diabetes: A1DM} and GHTN.  She reports active fetal movement, contractions: none, vaginal bleeding: none, membranes: intact.   OBJECTIVE:  BP 113/76   Pulse 84   Wt (!) 311 lb 3.2 oz (141.2 kg)   LMP 07/23/2022 (Approximate)   BMI 51.79 kg/m   Appears well, no apparent distress  No results found for this or any previous visit (from the past 24 hour(s)).  NST: FHR baseline 145 bpm, Variability: moderate, Accelerations:present, Decelerations:  Absent= Cat 1/reactive Toco: none   ASSESSMENT: G1P0000 at [redacted]w[redacted]d with Diabetes: A1DM} and GHTN NST reactive  PLAN: EFM strip reviewed by Nigel Berthold, CNM   Recommendations: keep next appointment as scheduled    Alice Rieger  03/17/2023 11:37 AM   Chart reviewed for nurse visit. Agree with plan of care.  Christin Fudge, [place] 03/17/2023 12:48 PM

## 2023-03-19 ENCOUNTER — Other Ambulatory Visit: Payer: Self-pay | Admitting: Advanced Practice Midwife

## 2023-03-19 DIAGNOSIS — O133 Gestational [pregnancy-induced] hypertension without significant proteinuria, third trimester: Secondary | ICD-10-CM

## 2023-03-20 ENCOUNTER — Encounter: Payer: Self-pay | Admitting: Obstetrics & Gynecology

## 2023-03-20 ENCOUNTER — Ambulatory Visit (INDEPENDENT_AMBULATORY_CARE_PROVIDER_SITE_OTHER): Payer: BC Managed Care – PPO | Admitting: Obstetrics & Gynecology

## 2023-03-20 ENCOUNTER — Ambulatory Visit (INDEPENDENT_AMBULATORY_CARE_PROVIDER_SITE_OTHER): Payer: BC Managed Care – PPO

## 2023-03-20 VITALS — BP 135/88 | HR 77 | Wt 321.0 lb

## 2023-03-20 DIAGNOSIS — O133 Gestational [pregnancy-induced] hypertension without significant proteinuria, third trimester: Secondary | ICD-10-CM

## 2023-03-20 DIAGNOSIS — Z3A34 34 weeks gestation of pregnancy: Secondary | ICD-10-CM | POA: Diagnosis not present

## 2023-03-20 DIAGNOSIS — O0993 Supervision of high risk pregnancy, unspecified, third trimester: Secondary | ICD-10-CM

## 2023-03-20 DIAGNOSIS — O24410 Gestational diabetes mellitus in pregnancy, diet controlled: Secondary | ICD-10-CM

## 2023-03-20 DIAGNOSIS — O403XX0 Polyhydramnios, third trimester, not applicable or unspecified: Secondary | ICD-10-CM

## 2023-03-20 DIAGNOSIS — Z141 Cystic fibrosis carrier: Secondary | ICD-10-CM

## 2023-03-20 NOTE — Progress Notes (Signed)
[place] 0000000 wks,cephalic,posterior placenta gr 3,FHR 146 bpm,polyhydramnios,AFI 29 cm,RI .59,.67,.64=70%,BPP 8/8

## 2023-03-20 NOTE — Progress Notes (Signed)
HIGH-RISK PREGNANCY VISIT Patient name: Angelica Crawford MRN [id number]  Date of birth: 1986-09-04 Chief Complaint:   Routine Prenatal Visit  History of Present Illness:   Angelica Crawford is a 37 y.o. G88P0000 female at [redacted]w[redacted]d with an Estimated Date of Delivery: 04/29/23 being seen today for ongoing management of a high-risk pregnancy complicated by diabetes mellitus A1DM and gestational hypertension currently on no meds.    Today she reports no complaints. Contractions: Not present. Vag. Bleeding: None.  Movement: Present. denies leaking of fluid.      10/28/2022    1:45 PM  Depression screen PHQ 2/9  Decreased Interest 0  Down, Depressed, Hopeless 0  PHQ - 2 Score 0  Altered sleeping 0  Tired, decreased energy 2  Change in appetite 2  Feeling bad or failure about yourself  0  Trouble concentrating 0  Moving slowly or fidgety/restless 0  Suicidal thoughts 0  PHQ-9 Score 4        10/28/2022    1:46 PM  GAD 7 : Generalized Anxiety Score  Nervous, Anxious, on Edge 0  Control/stop worrying 0  Worry too much - different things 0  Trouble relaxing 0  Restless 0  Easily annoyed or irritable 0  Afraid - awful might happen 0  Total GAD 7 Score 0     Review of Systems:   Pertinent items are noted in HPI Denies abnormal vaginal discharge w/ itching/odor/irritation, headaches, visual changes, shortness of breath, chest pain, abdominal pain, severe nausea/vomiting, or problems with urination or bowel movements unless otherwise stated above. Pertinent History Reviewed:  Reviewed past medical,surgical, social, obstetrical and family history.  Reviewed problem list, medications and allergies. Physical Assessment:   Vitals:   03/20/23 1124  BP: 135/88  Pulse: 77  Weight: (!) 321 lb (145.6 kg)  Body mass index is 53.42 kg/m.           Physical Examination:   General appearance: alert, well appearing, and in no distress  Mental status: alert, oriented to person, place, and  time  Skin: warm & dry   Extremities:      Cardiovascular: normal heart rate noted  Respiratory: normal respiratory effort, no distress  Abdomen: gravid, soft, non-tender  Pelvic: Cervical exam deferred         Fetal Status:     Movement: Present    Fetal Surveillance Testing today: BPP 8/8 UAD 70%   Chaperone: N/A    No results found for this or any previous visit (from the past 24 hour(s)).  Assessment & Plan:  High-risk pregnancy: G1P0000 at [redacted]w[redacted]d with an Estimated Date of Delivery: 04/29/23      ICD-10-CM   1. Encounter for supervision of high risk pregnancy in third trimester, antepartum  O09.93     2. Gestational hypertension, third trimester  O13.3     3. Diet controlled gestational diabetes mellitus (GDM) in third trimester  O24.410     4. Polyhydramnios in third trimester complication, single or unspecified fetus  O40.3XX0         Meds: No orders of the defined types were placed in this encounter.   Orders: No orders of the defined types were placed in this encounter.    Labs/procedures today: U/S  Treatment Plan:  keep scheduled  Reviewed: Preterm labor symptoms and general obstetric precautions including but not limited to vaginal bleeding, contractions, leaking of fluid and fetal movement were reviewed in detail with the patient.  All questions were answered.  Does have home bp cuff. Office bp cuff given: not applicable. Check bp twice daily, let us know if consistently >140 and/or >90.  Follow-up: Return for keep scheduled.   Future Appointments  Date Time Provider [place]  03/24/2023 10:30 AM [facility] NURSE [facility]  03/27/2023  8:30 AM [facility] - [facility] [place] [facility] None  03/27/2023  9:30 AM Christin Fudge, CNM [facility]  03/31/2023 10:30 AM [facility] NURSE [facility]  04/03/2023 10:45 AM [facility] [place] [facility] None  04/03/2023 11:50 AM Janyth Pupa, DO [facility]  04/07/2023 10:10 AM [facility] NURSE [facility]  FTOBGYN  04/10/2023  8:30 AM [facility] - [facility] [place] [facility] None  04/10/2023  9:30 AM Florian Buff, MD [facility]  04/14/2023 10:10 AM [facility] NURSE [facility]    No orders of the defined types were placed in this encounter.  Florian Buff  Attending Physician for the [facility] 03/20/2023 11:59 AM

## 2023-03-24 ENCOUNTER — Ambulatory Visit (INDEPENDENT_AMBULATORY_CARE_PROVIDER_SITE_OTHER): Payer: BC Managed Care – PPO | Admitting: *Deleted

## 2023-03-24 VITALS — BP 126/82 | HR 75 | Wt 328.0 lb

## 2023-03-24 DIAGNOSIS — O403XX0 Polyhydramnios, third trimester, not applicable or unspecified: Secondary | ICD-10-CM

## 2023-03-24 DIAGNOSIS — O0993 Supervision of high risk pregnancy, unspecified, third trimester: Secondary | ICD-10-CM | POA: Diagnosis not present

## 2023-03-24 DIAGNOSIS — O133 Gestational [pregnancy-induced] hypertension without significant proteinuria, third trimester: Secondary | ICD-10-CM | POA: Diagnosis not present

## 2023-03-24 DIAGNOSIS — O24410 Gestational diabetes mellitus in pregnancy, diet controlled: Secondary | ICD-10-CM | POA: Diagnosis not present

## 2023-03-24 DIAGNOSIS — Z3A34 34 weeks gestation of pregnancy: Secondary | ICD-10-CM

## 2023-03-24 NOTE — Progress Notes (Signed)
   NURSE VISIT- NST  SUBJECTIVE:  Angelica Crawford is a 37 y.o. G1P0000 female at [redacted]w[redacted]d, here for a NST for pregnancy complicated by Diabetes: A1DM}, GHTN, and Polyhydramnios.  She reports active fetal movement, contractions: none, vaginal bleeding: none, membranes: intact.   OBJECTIVE:  BP 126/82   Pulse 75   Wt (!) 328 lb (148.8 kg)   LMP 07/23/2022 (Approximate)   BMI 54.58 kg/m   Appears well, no apparent distress  No results found for this or any previous visit (from the past 24 hour(s)).  NST: FHR baseline 145 bpm, Variability: moderate, Accelerations:present, Decelerations:  Absent= Cat 1/reactive Toco: none   ASSESSMENT: G1P0000 at [redacted]w[redacted]d with Diabetes: A1DM}, GHTN, and Polyhydramnios NST reactive  PLAN: EFM strip reviewed by Dr. Despina Hidden   Recommendations: keep next appointment as scheduled    Jobe Marker  03/24/2023 3:44 PM

## 2023-03-26 ENCOUNTER — Other Ambulatory Visit: Payer: Self-pay | Admitting: Advanced Practice Midwife

## 2023-03-26 DIAGNOSIS — O133 Gestational [pregnancy-induced] hypertension without significant proteinuria, third trimester: Secondary | ICD-10-CM

## 2023-03-27 ENCOUNTER — Ambulatory Visit (INDEPENDENT_AMBULATORY_CARE_PROVIDER_SITE_OTHER): Payer: BC Managed Care – PPO | Admitting: Advanced Practice Midwife

## 2023-03-27 ENCOUNTER — Ambulatory Visit (INDEPENDENT_AMBULATORY_CARE_PROVIDER_SITE_OTHER): Payer: BC Managed Care – PPO

## 2023-03-27 ENCOUNTER — Encounter: Payer: Self-pay | Admitting: Advanced Practice Midwife

## 2023-03-27 VITALS — BP 136/89 | HR 90 | Wt 329.0 lb

## 2023-03-27 DIAGNOSIS — O2603 Excessive weight gain in pregnancy, third trimester: Secondary | ICD-10-CM

## 2023-03-27 DIAGNOSIS — O0993 Supervision of high risk pregnancy, unspecified, third trimester: Secondary | ICD-10-CM

## 2023-03-27 DIAGNOSIS — O24410 Gestational diabetes mellitus in pregnancy, diet controlled: Secondary | ICD-10-CM

## 2023-03-27 DIAGNOSIS — O133 Gestational [pregnancy-induced] hypertension without significant proteinuria, third trimester: Secondary | ICD-10-CM

## 2023-03-27 DIAGNOSIS — O403XX0 Polyhydramnios, third trimester, not applicable or unspecified: Secondary | ICD-10-CM

## 2023-03-27 DIAGNOSIS — Z3A35 35 weeks gestation of pregnancy: Secondary | ICD-10-CM | POA: Diagnosis not present

## 2023-03-27 DIAGNOSIS — Z141 Cystic fibrosis carrier: Secondary | ICD-10-CM

## 2023-03-27 NOTE — Progress Notes (Signed)
[place] 35+2 wks,cephalic,BPP 8/8,RI .58,.60,.56,.61=57%,polyhydramnios,AFI 33.5 cm,FHR 146 bpm

## 2023-03-27 NOTE — Patient Instructions (Signed)
Angelica Crawford, I greatly value your feedback.  If you receive a survey following your visit with [place] today, we appreciate you taking the time to fill it out.  Thanks, Angelica Beams, DNP, CNM  [facility] HAS MOVED!!! It is now [facility] at [facility] ([address] [place], [place] [address]) Entrance located off of [address] Free 24/7 valet parking   Go to [email or web address] to register for FREE online childbirth classes    Call the office [phone]) or go to [facility] if: You begin to have strong, frequent contractions Your water breaks.  Sometimes it is a big gush of fluid, sometimes it is just a trickle that keeps getting your panties wet or running down your legs You have vaginal bleeding.  It is normal to have a small amount of spotting if your cervix was checked.  You don't feel your baby moving like normal.  If you don't, get you something to eat and drink and lay down and focus on feeling your baby move.  You should feel at least 10 movements in 2 hours.  If you don't, you should call the office or go to [facility].   Home Blood Pressure Monitoring for Patients   Your provider has recommended that you check your blood pressure (BP) at least once a week at home. If you do not have a blood pressure cuff at home, one will be provided for you. Contact your provider if you have not received your monitor within 1 week.   Helpful Tips for Accurate Home Blood Pressure Checks  Don't smoke, exercise, or drink caffeine 30 minutes before checking your BP Use the restroom before checking your BP (a full bladder can raise your pressure) Relax in a comfortable upright chair Feet on the ground Left arm resting comfortably on a flat surface at the level of your heart Legs uncrossed Back supported Sit quietly and don't talk Place the cuff on your bare arm Adjust snuggly, so that only two fingertips can fit between your skin and the top  of the cuff Check 2 readings separated by at least one minute Keep a log of your BP readings For a visual, please reference this diagram: http://ccnc.care/bpdiagram  Provider Name: [facility]     Phone: [phone]  Zone 1: ALL CLEAR  Continue to monitor your symptoms:  BP reading is less than 140 (top number) or less than 90 (bottom number)  No right upper stomach pain No headaches or seeing spots No feeling nauseated or throwing up No swelling in face and hands  Zone 2: CAUTION Call your doctor's office for any of the following:  BP reading is greater than 140 (top number) or greater than 90 (bottom number)  Stomach pain under your ribs in the middle or right side Headaches or seeing spots Feeling nauseated or throwing up Swelling in face and hands  Zone 3: EMERGENCY  Seek immediate medical care if you have any of the following:  BP reading is greater than160 (top number) or greater than 110 (bottom number) Severe headaches not improving with Tylenol Serious difficulty catching your breath Any worsening symptoms from Zone 2

## 2023-03-27 NOTE — Progress Notes (Signed)
HIGH-RISK PREGNANCY VISIT Patient name: UNKOWN TEDFORD MRN [id number]  Date of birth: Sep 17, 1986 Chief Complaint:   Routine Prenatal Visit and Pregnancy Ultrasound  History of Present Illness:   Angelica Crawford is a 37 y.o. G65P0000 female at [redacted]w[redacted]d with an Estimated Date of Delivery: 04/29/23 being seen today for ongoing management of a high-risk pregnancy complicated by chronic hypertension currently on nothing, diabetes mellitus A1DM, and polyhydramnios.  All BS in range per pt.   Today she reports no complaints. Contractions: Not present.  .  Movement: Present. denies leaking of fluid.      10/28/2022    1:45 PM  Depression screen PHQ 2/9  Decreased Interest 0  Down, Depressed, Hopeless 0  PHQ - 2 Score 0  Altered sleeping 0  Tired, decreased energy 2  Change in appetite 2  Feeling bad or failure about yourself  0  Trouble concentrating 0  Moving slowly or fidgety/restless 0  Suicidal thoughts 0  PHQ-9 Score 4        10/28/2022    1:46 PM  GAD 7 : Generalized Anxiety Score  Nervous, Anxious, on Edge 0  Control/stop worrying 0  Worry too much - different things 0  Trouble relaxing 0  Restless 0  Easily annoyed or irritable 0  Afraid - awful might happen 0  Total GAD 7 Score 0     Review of Systems:   Pertinent items are noted in HPI Denies abnormal vaginal discharge w/ itching/odor/irritation, headaches, visual changes, shortness of breath, chest pain, abdominal pain, severe nausea/vomiting, or problems with urination or bowel movements unless otherwise stated above. Pertinent History Reviewed:  Reviewed past medical,surgical, social, obstetrical and family history.  Reviewed problem list, medications and allergies. Physical Assessment:   Vitals:   03/27/23 0920  BP: 136/89  Pulse: 90  Weight: (!) 329 lb (149.2 kg)  Body mass index is 54.75 kg/m.           Physical Examination:   General appearance: alert, well appearing, and in no distress  Mental  status: alert, oriented to person, place, and time  Skin: warm & dry   Extremities: Edema: None    Cardiovascular: normal heart rate noted  Respiratory: normal respiratory effort, no distress  Abdomen: gravid, soft, non-tender  Pelvic: Cervical exam deferred         Fetal Status:     Movement: Present    Fetal Surveillance Testing today:   [place] 35+2 wks,cephalic,BPP 8/8,RI .58,.60,.56,.61=57%,polyhydramnios,AFI 33.5 cm,FHR 146 bpm     Chaperone: pt declined    No results found for this or any previous visit (from the past 24 hour(s)).  Assessment & Plan:  High-risk pregnancy: G1P0000 at [redacted]w[redacted]d with an Estimated Date of Delivery: 04/29/23   1. Encounter for supervision of high risk pregnancy in third trimester, antepartum   2. [redacted] weeks gestation of pregnancy   3. Polyhydramnios in third trimester complication, single or unspecified fetus Now severe, will IOL 37 weeks  4. Morbid obesity   5. Diet controlled gestational diabetes mellitus (GDM) in third trimester All normal per pt  6. Gestational hypertension, third trimester Normal BP    Meds: No orders of the defined types were placed in this encounter.   Orders: No orders of the defined types were placed in this encounter.     Reviewed: Preterm labor symptoms and general obstetric precautions including but not limited to vaginal bleeding, contractions, leaking of fluid and fetal movement were reviewed in detail with  the patient.  All questions were answered. Does have home bp cuff. Office bp cuff given: not applicable. Check bp daily, let us know if consistently >150 and/or >95.  Follow-up:   Future Appointments  Date Time Provider Department Center  03/31/2023 10:30 AM [facility] NURSE [facility]  04/03/2023 10:45 AM [facility] - [facility] [place] [facility] None  04/03/2023 11:50 AM Myna Hidalgo, DO [facility]  04/07/2023  7:00 AM [facility] SCHED ROOM [facility]INDC None  04/07/2023 10:10 AM [facility] NURSE [facility]   04/10/2023  8:30 AM [facility] - [facility] [place] [facility] None  04/10/2023  9:30 AM Lazaro Arms, MD [facility]  04/14/2023 10:10 AM [facility] NURSE [facility]    No orders of the defined types were placed in this encounter.  Jacklyn Shell , DNP, CNM [facility] 03/27/2023 11:25 AM

## 2023-03-31 ENCOUNTER — Telehealth (HOSPITAL_COMMUNITY): Payer: Self-pay | Admitting: *Deleted

## 2023-03-31 ENCOUNTER — Encounter (HOSPITAL_COMMUNITY): Payer: Self-pay | Admitting: *Deleted

## 2023-03-31 ENCOUNTER — Other Ambulatory Visit: Payer: BC Managed Care – PPO

## 2023-03-31 ENCOUNTER — Ambulatory Visit (INDEPENDENT_AMBULATORY_CARE_PROVIDER_SITE_OTHER): Payer: BC Managed Care – PPO | Admitting: *Deleted

## 2023-03-31 VITALS — BP 150/91 | HR 83 | Wt 336.0 lb

## 2023-03-31 DIAGNOSIS — Z141 Cystic fibrosis carrier: Secondary | ICD-10-CM

## 2023-03-31 DIAGNOSIS — O0993 Supervision of high risk pregnancy, unspecified, third trimester: Secondary | ICD-10-CM

## 2023-03-31 DIAGNOSIS — O133 Gestational [pregnancy-induced] hypertension without significant proteinuria, third trimester: Secondary | ICD-10-CM

## 2023-03-31 DIAGNOSIS — O24410 Gestational diabetes mellitus in pregnancy, diet controlled: Secondary | ICD-10-CM

## 2023-03-31 DIAGNOSIS — Z3A35 35 weeks gestation of pregnancy: Secondary | ICD-10-CM

## 2023-03-31 DIAGNOSIS — O403XX0 Polyhydramnios, third trimester, not applicable or unspecified: Secondary | ICD-10-CM | POA: Diagnosis not present

## 2023-03-31 DIAGNOSIS — O2603 Excessive weight gain in pregnancy, third trimester: Secondary | ICD-10-CM

## 2023-03-31 NOTE — Progress Notes (Signed)
   NURSE VISIT- NST  SUBJECTIVE:  Angelica Crawford is a 37 y.o. G1P0000 female at [redacted]w[redacted]d, here for a NST for pregnancy complicated by Diabetes: A1DM} and GHTN.  She reports active fetal movement, contractions: none, vaginal bleeding: none, membranes: intact.   OBJECTIVE:  BP (!) 150/91   Pulse 83   Wt (!) 336 lb (152.4 kg)   LMP 07/23/2022 (Approximate)   BMI 55.91 kg/m   Appears well, no apparent distress  No results found for this or any previous visit (from the past 24 hour(s)).  NST: FHR baseline 145 bpm, Variability: moderate, Accelerations:present, Decelerations:  Absent= Cat 1/reactive Toco: none   ASSESSMENT: G1P0000 at [redacted]w[redacted]d with Diabetes: A1DM} and GHTN NST reactive  PLAN: EFM strip reviewed by [name], CNM   Recommendations: keep next appointment as scheduled    Angelica Crawford  03/31/2023 11:58 AM

## 2023-03-31 NOTE — Telephone Encounter (Signed)
Preadmission screen  

## 2023-04-01 ENCOUNTER — Encounter (HOSPITAL_COMMUNITY): Payer: Self-pay | Admitting: Obstetrics and Gynecology

## 2023-04-01 ENCOUNTER — Other Ambulatory Visit: Payer: Self-pay | Admitting: Advanced Practice Midwife

## 2023-04-01 ENCOUNTER — Encounter: Payer: Self-pay | Admitting: *Deleted

## 2023-04-01 ENCOUNTER — Inpatient Hospital Stay (HOSPITAL_COMMUNITY)
Admission: AD | Admit: 2023-04-01 | Discharge: 2023-04-07 | DRG: 787 | Disposition: A | Discharge status: Home / Self Care | Payer: BC Managed Care – PPO | Attending: Obstetrics and Gynecology | Admitting: Obstetrics and Gynecology

## 2023-04-01 DIAGNOSIS — O99892 Other specified diseases and conditions complicating childbirth: Secondary | ICD-10-CM | POA: Diagnosis not present

## 2023-04-01 DIAGNOSIS — O99344 Other mental disorders complicating childbirth: Secondary | ICD-10-CM | POA: Diagnosis present

## 2023-04-01 DIAGNOSIS — O403XX0 Polyhydramnios, third trimester, not applicable or unspecified: Secondary | ICD-10-CM | POA: Diagnosis present

## 2023-04-01 DIAGNOSIS — O0993 Supervision of high risk pregnancy, unspecified, third trimester: Secondary | ICD-10-CM

## 2023-04-01 DIAGNOSIS — O139 Gestational [pregnancy-induced] hypertension without significant proteinuria, unspecified trimester: Secondary | ICD-10-CM | POA: Diagnosis present

## 2023-04-01 DIAGNOSIS — O1414 Severe pre-eclampsia complicating childbirth: Principal | ICD-10-CM | POA: Diagnosis present

## 2023-04-01 DIAGNOSIS — O99824 Streptococcus B carrier state complicating childbirth: Secondary | ICD-10-CM | POA: Diagnosis present

## 2023-04-01 DIAGNOSIS — F419 Anxiety disorder, unspecified: Secondary | ICD-10-CM | POA: Diagnosis present

## 2023-04-01 DIAGNOSIS — O99214 Obesity complicating childbirth: Secondary | ICD-10-CM | POA: Diagnosis present

## 2023-04-01 DIAGNOSIS — O24420 Gestational diabetes mellitus in childbirth, diet controlled: Secondary | ICD-10-CM | POA: Diagnosis present

## 2023-04-01 DIAGNOSIS — Z141 Cystic fibrosis carrier: Secondary | ICD-10-CM

## 2023-04-01 DIAGNOSIS — O1413 Severe pre-eclampsia, third trimester: Principal | ICD-10-CM

## 2023-04-01 DIAGNOSIS — N179 Acute kidney failure, unspecified: Secondary | ICD-10-CM | POA: Diagnosis not present

## 2023-04-01 DIAGNOSIS — R21 Rash and other nonspecific skin eruption: Secondary | ICD-10-CM | POA: Diagnosis present

## 2023-04-01 DIAGNOSIS — O24419 Gestational diabetes mellitus in pregnancy, unspecified control: Secondary | ICD-10-CM | POA: Diagnosis present

## 2023-04-01 DIAGNOSIS — O1410 Severe pre-eclampsia, unspecified trimester: Secondary | ICD-10-CM | POA: Diagnosis present

## 2023-04-01 DIAGNOSIS — Z880 Allergy status to penicillin: Secondary | ICD-10-CM | POA: Diagnosis not present

## 2023-04-01 DIAGNOSIS — Z3A36 36 weeks gestation of pregnancy: Secondary | ICD-10-CM | POA: Diagnosis not present

## 2023-04-01 DIAGNOSIS — F32A Depression, unspecified: Secondary | ICD-10-CM | POA: Diagnosis present

## 2023-04-01 DIAGNOSIS — O24410 Gestational diabetes mellitus in pregnancy, diet controlled: Secondary | ICD-10-CM

## 2023-04-01 DIAGNOSIS — L299 Pruritus, unspecified: Secondary | ICD-10-CM | POA: Diagnosis present

## 2023-04-01 DIAGNOSIS — Z98891 History of uterine scar from previous surgery: Secondary | ICD-10-CM

## 2023-04-01 LAB — CBC
HCT: 36.5 % (ref 36.0–46.0)
Hemoglobin: 12.3 g/dL (ref 12.0–15.0)
MCH: 30 pg (ref 26.0–34.0)
MCHC: 33.7 g/dL (ref 30.0–36.0)
MCV: 89 fL (ref 80.0–100.0)
Platelets: 203 10*3/uL (ref 150–400)
RBC: 4.1 MIL/uL (ref 3.87–5.11)
RDW: 13.8 % (ref 11.5–15.5)
WBC: 9.5 10*3/uL (ref 4.0–10.5)
nRBC: 0 % (ref 0.0–0.2)

## 2023-04-01 LAB — COMPREHENSIVE METABOLIC PANEL
ALT: 28 U/L (ref 0–44)
AST: 23 U/L (ref 15–41)
Albumin: 2.2 g/dL — ABNORMAL LOW (ref 3.5–5.0)
Alkaline Phosphatase: 104 U/L (ref 38–126)
Anion gap: 9 (ref 5–15)
BUN: 19 mg/dL (ref 6–20)
CO2: 19 mmol/L — ABNORMAL LOW (ref 22–32)
Calcium: 8.8 mg/dL — ABNORMAL LOW (ref 8.9–10.3)
Chloride: 106 mmol/L (ref 98–111)
Creatinine, Ser: 0.85 mg/dL (ref 0.44–1.00)
GFR, Estimated: >60 mL/min (ref >60)
Glucose, Bld: 80 mg/dL (ref 70–99)
Potassium: 4.1 mmol/L (ref 3.5–5.1)
Sodium: 134 mmol/L — ABNORMAL LOW (ref 135–145)
Total Bilirubin: 0.6 mg/dL (ref 0.3–1.2)
Total Protein: 6.1 g/dL — ABNORMAL LOW (ref 6.5–8.1)

## 2023-04-01 LAB — PROTEIN / CREATININE RATIO, URINE
Creatinine, Urine: 56 mg/dL
Protein Creatinine Ratio: 0.16 mg/mg{Cre} — ABNORMAL HIGH (ref 0.00–0.15)
Total Protein, Urine: 9 mg/dL

## 2023-04-01 LAB — GROUP B STREP BY PCR: Group B strep by PCR: POSITIVE — AB

## 2023-04-01 LAB — TYPE AND SCREEN
ABO/RH(D): O POS
Antibody Screen: NEGATIVE

## 2023-04-01 MED ORDER — MAGNESIUM SULFATE BOLUS VIA INFUSION
4.0000 g | Freq: Once | INTRAVENOUS | Status: AC
  Administered 2023-04-01: 4 g via INTRAVENOUS
  Filled 2023-04-01: qty 1000

## 2023-04-01 MED ORDER — MISOPROSTOL 25 MCG QUARTER TABLET
25.0000 ug | ORAL_TABLET | Freq: Once | ORAL | Status: AC
  Administered 2023-04-01: 25 ug via VAGINAL
  Filled 2023-04-01: qty 1

## 2023-04-01 MED ORDER — MISOPROSTOL 50MCG HALF TABLET
50.0000 ug | ORAL_TABLET | Freq: Once | ORAL | Status: AC
  Administered 2023-04-01: 50 ug via ORAL
  Filled 2023-04-01: qty 1

## 2023-04-01 MED ORDER — LACTATED RINGERS IV SOLN
500.0000 mL | INTRAVENOUS | Status: DC | PRN
  Administered 2023-04-03: 1000 mL via INTRAVENOUS
  Administered 2023-04-03: 250 mL via INTRAVENOUS

## 2023-04-01 MED ORDER — VANCOMYCIN HCL 10 G IV SOLR
2000.0000 mg | Freq: Three times a day (TID) | INTRAVENOUS | Status: DC
  Administered 2023-04-02: 2000 mg via INTRAVENOUS
  Filled 2023-04-01 (×3): qty 20

## 2023-04-01 MED ORDER — HYDRALAZINE HCL 20 MG/ML IJ SOLN: 10.0000 mg | INTRAMUSCULAR | Status: DC | PRN

## 2023-04-01 MED ORDER — TERBUTALINE SULFATE 1 MG/ML IJ SOLN: 0.2500 mg | Freq: Once | INTRAMUSCULAR | Status: DC | PRN

## 2023-04-01 MED ORDER — ACETAMINOPHEN 325 MG PO TABS
650.0000 mg | ORAL_TABLET | ORAL | Status: DC | PRN
  Administered 2023-04-01: 650 mg via ORAL
  Filled 2023-04-01: qty 2

## 2023-04-01 MED ORDER — FLUOXETINE HCL 20 MG PO CAPS
80.0000 mg | ORAL_CAPSULE | Freq: Every day | ORAL | Status: DC
  Administered 2023-04-02 – 2023-04-03 (×2): 80 mg via ORAL
  Filled 2023-04-01 (×4): qty 4

## 2023-04-01 MED ORDER — OXYTOCIN BOLUS FROM INFUSION: 333.0000 mL | Freq: Once | INTRAVENOUS | Status: DC

## 2023-04-01 MED ORDER — LIDOCAINE HCL (PF) 1 % IJ SOLN: 30.0000 mL | INTRAMUSCULAR | Status: DC | PRN

## 2023-04-01 MED ORDER — OXYCODONE-ACETAMINOPHEN 5-325 MG PO TABS: 2.0000 | ORAL_TABLET | ORAL | Status: DC | PRN

## 2023-04-01 MED ORDER — CLINDAMYCIN PHOSPHATE 900 MG/50ML IV SOLN: 900.0000 mg | Freq: Three times a day (TID) | INTRAVENOUS | Status: DC

## 2023-04-01 MED ORDER — LABETALOL HCL 5 MG/ML IV SOLN: 80.0000 mg | INTRAVENOUS | Status: DC | PRN

## 2023-04-01 MED ORDER — ONDANSETRON HCL 4 MG/2ML IJ SOLN: 4.0000 mg | Freq: Four times a day (QID) | INTRAMUSCULAR | Status: DC | PRN

## 2023-04-01 MED ORDER — OXYTOCIN-SODIUM CHLORIDE 30-0.9 UT/500ML-% IV SOLN
2.5000 [IU]/h | INTRAVENOUS | Status: DC
  Filled 2023-04-01: qty 500

## 2023-04-01 MED ORDER — LACTATED RINGERS IV SOLN: INTRAVENOUS | Status: DC

## 2023-04-01 MED ORDER — OXYCODONE-ACETAMINOPHEN 5-325 MG PO TABS: 1.0000 | ORAL_TABLET | ORAL | Status: DC | PRN

## 2023-04-01 MED ORDER — ASPIRIN-ACETAMINOPHEN-CAFFEINE 250-250-65 MG PO TABS
2.0000 | ORAL_TABLET | Freq: Once | ORAL | Status: DC
  Filled 2023-04-01: qty 2

## 2023-04-01 MED ORDER — SOD CITRATE-CITRIC ACID 500-334 MG/5ML PO SOLN
30.0000 mL | ORAL | Status: DC | PRN
  Administered 2023-04-03: 30 mL via ORAL
  Filled 2023-04-01: qty 30

## 2023-04-01 MED ORDER — BUPROPION HCL ER (XL) 150 MG PO TB24
150.0000 mg | ORAL_TABLET | Freq: Every morning | ORAL | Status: DC
  Administered 2023-04-02 – 2023-04-03 (×2): 150 mg via ORAL
  Filled 2023-04-01 (×3): qty 1

## 2023-04-01 MED ORDER — MAGNESIUM SULFATE 40 GM/1000ML IV SOLN
2.0000 g/h | INTRAVENOUS | Status: DC
  Administered 2023-04-02: 2 g/h via INTRAVENOUS
  Filled 2023-04-01 (×3): qty 1000

## 2023-04-01 MED ORDER — LABETALOL HCL 5 MG/ML IV SOLN
20.0000 mg | INTRAVENOUS | Status: DC | PRN
  Administered 2023-04-01: 20 mg via INTRAVENOUS
  Filled 2023-04-01: qty 4

## 2023-04-01 MED ORDER — LABETALOL HCL 5 MG/ML IV SOLN: 40.0000 mg | INTRAVENOUS | Status: DC | PRN

## 2023-04-01 NOTE — MAU Provider Note (Signed)
History     CSN: [id number]  Arrival date and time: 04/01/23 1656   Event Date/Time   First Provider Initiated Contact with Patient 04/01/23 1717      Chief Complaint  Patient presents with   Hypertension   Headache   Visual Disturbances   Bilateral Swelling of Feet   Hypertension Associated symptoms include headaches. Pertinent negatives include no chest pain or shortness of breath.  Headache  Pertinent negatives include no abdominal pain, back pain, coughing, dizziness, eye pain, fever, nausea, sore throat or vomiting. Her past medical history is significant for hypertension.   Patient is 37 y.o. G1P0000 [redacted]w[redacted]d here with complaints of Elevated blood pressure-- known history of GHTN. Reports elevated BPs at home 160-200/100s. Patient reports HA rates at 6/10, has not taken anything for headache. Report associated spots in vision, also a "halo of light in her vision." Noted swelling in her feet today.   Recently diagnosed with severe poly with AFI 33.5, planned IOL on 4/22 +FM, denies LOF, VB, contractions, vaginal discharge.   OB History     Gravida  1   Para  0   Term  0   Preterm  0   AB  0   Living  0      SAB  0   IAB  0   Ectopic  0   Multiple  0   Live Births  0           Past Medical History:  Diagnosis Date   Anxiety    Back pain    Depression    Gestational diabetes    Kidney stones    Mental disorder    Migraines    Pregnancy induced hypertension    Seizures    Sleep apnea    Vaginal Pap smear, abnormal     Past Surgical History:  Procedure Laterality Date   EXTRACORPOREAL SHOCK WAVE LITHOTRIPSY Right 03/19/2022   Procedure: EXTRACORPOREAL SHOCK WAVE LITHOTRIPSY (ESWL);  Surgeon: Malen Gauze, MD;  Location: [facility];  Service: Urology;  Laterality: Right;    Family History  Problem Relation Age of Onset   Other Mother        MS   Multiple sclerosis Mother    Cancer Mother    Breast cancer Mother    Diabetes Father     Other Maternal Grandmother        MS   Cancer Paternal Grandfather     Social History   Tobacco Use   Smoking status: Never   Smokeless tobacco: Never  Vaping Use   Vaping Use: Never used  Substance Use Topics   Alcohol use: Not Currently    Comment: occas   Drug use: No    Allergies:  Allergies  Allergen Reactions   Penicillins Anaphylaxis   Sulfa Antibiotics Anaphylaxis   Gabapentin Other (See Comments)    tremors   Salicylic Acid Other (See Comments)    Medications Prior to Admission  Medication Sig Dispense Refill Last Dose   Accu-Chek Softclix Lancets lancets Use as instructed to check blood sugar 4 times daily 100 each 12    Blood Glucose Monitoring Suppl (ACCU-CHEK GUIDE ME) w/Device KIT 1 each by Does not apply route 4 (four) times daily. 1 kit 0    buPROPion (WELLBUTRIN XL) 150 MG 24 hr tablet Take 1 tablet (150 mg total) by mouth every morning. 30 tablet 0    EPINEPHrine 0.3 mg/0.3 mL IJ SOAJ injection Inject 0.3 mg into  the muscle as needed for anaphylaxis.  (Patient not taking: Reported on 10/28/2022)      FLUoxetine (PROZAC) 40 MG capsule Take 80 mg by mouth daily.       glucose blood (ACCU-CHEK GUIDE) test strip Use as instructed to check blood sugar 4 times daily 50 each 12    Prenatal Vit-Fe Fumarate-FA (PRENATAL VITAMIN PO) Take by mouth.       Review of Systems  Constitutional:  Negative for chills and fever.  HENT:  Negative for congestion and sore throat.   Eyes:  Negative for pain and visual disturbance.  Respiratory:  Negative for cough, chest tightness and shortness of breath.   Cardiovascular:  Negative for chest pain.  Gastrointestinal:  Negative for abdominal pain, diarrhea, nausea and vomiting.  Endocrine: Negative for cold intolerance and heat intolerance.  Genitourinary:  Negative for dysuria and flank pain.  Musculoskeletal:  Negative for back pain.  Skin:  Negative for rash.  Allergic/Immunologic: Negative for food allergies.   Neurological:  Positive for headaches. Negative for dizziness and light-headedness.  Psychiatric/Behavioral:  Negative for agitation.    Physical Exam   Last menstrual period 07/23/2022.  Physical Exam Vitals and nursing note reviewed.  Constitutional:      General: She is not in acute distress.    Appearance: She is well-developed.     Comments: Pregnant female  HENT:     Head: Normocephalic and atraumatic.  Eyes:     General: No scleral icterus.    Conjunctiva/sclera: Conjunctivae normal.  Cardiovascular:     Rate and Rhythm: Normal rate.  Pulmonary:     Effort: Pulmonary effort is normal.  Chest:     Chest wall: No tenderness.  Abdominal:     Palpations: Abdomen is soft.     Tenderness: There is no abdominal tenderness. There is no guarding or rebound.     Comments: Gravid  Genitourinary:    Vagina: Normal.  Musculoskeletal:        General: Normal range of motion.     Cervical back: Normal range of motion and neck supple.  Skin:    General: Skin is warm and dry.     Findings: No rash.  Neurological:     Mental Status: She is alert and oriented to person, place, and time.    MAU Course  Procedures  MDM: high  This patient presents to the ED for concern of   Chief Complaint  Patient presents with   Hypertension   Headache   Visual Disturbances   Bilateral Swelling of Feet     These complains involves an extensive number of treatment options, and is a complaint that carries with it a high risk of complications and morbidity.  The differential diagnosis for  1. 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 HA continued   After the interventions noted above, I reevaluated the patient and  found that they have :improved  Dispostion: admitted to the hospital   Assessment and Plan  Admitted for Preeclampsia with Severe Features  Federico Flake 04/01/2023, 5:18 PM

## 2023-04-01 NOTE — Consult Note (Signed)
ANTIBIOTIC CONSULT NOTE - INITIAL  Pharmacy Consult for Vancomycin Indication: GBS px   Patient Measurements: Height:  (165.1 cm) Weight: (!) 152.8 kg (336 lb 12.8 oz)  Labs: No results for input(s): "PROCALCITON" in the last 168 hours.   Recent Labs    04/01/23 1739  WBC 9.5  PLT 203  CREATININE 0.85   No results for input(s): "GENTTROUGH", "GENTPEAK", "GENTRANDOM", "VANCOTROUGH", "VANCOPEAK", "VANCORANDOM" in the last 72 hours.  Microbiology: Recent Results (from the past 720 hour(s))  Group B strep by PCR     Status: Abnormal   Collection Time: 04/01/23  8:45 PM   Specimen: Vaginal/Rectal; Genital  Result Value Ref Range Status   Group B strep by PCR POSITIVE (A) NEGATIVE Final    Comment: (NOTE) Intrapartum testing with Xpert GBS assay should be used as an adjunct to other methods available and not used to replace antepartum testing (at 35-[redacted] weeks gestation). Performed at [facility], [address], [place], [place] [address]    Medications:  Vancomycin 2000 mg every 8 hours   Goal of Therapy:  Vancomycin trough 15-20 mg/L  Plan:  Vancomycin 2000 mg IV Q 8 hrs to start at 2330 on 04/01/2023 Will obtain a vancomycin trough if therapy is continued beyond 24 hours or sooner if there is a change in renal function   Janey Greaser 04/01/2023,10:48 PM

## 2023-04-01 NOTE — MAU Note (Signed)
...  Angelica Crawford is a 37 y.o. at [redacted]w[redacted]d here in MAU reporting: Severe range blood pressures at home accompanied by HA, visual floaters, and light sensitivity. She reports she has been experiencing HA's for the past week off an on. Her current HA began this morning. She has not taken anything for the pain. Denies VB or LOF. +FM.   GHTN. AMA. GDMA1. Poly.  Pain score: 6/10 HA  FHT: 135 initial external Lab orders placed from triage:  UA

## 2023-04-01 NOTE — H&P (Signed)
OBSTETRIC ADMISSION HISTORY AND PHYSICAL  Angelica Crawford is a 37 y.o. female G1P0000 with IUP at [redacted]w[redacted]d by 10wk [place] presenting for IOL for severe preeclampsia (BP and HA/visual changes). She has peripheral edema and a HA. She describes blurry vision especially on the right. No like her previous migraines.    She reports +FMs, No LOF, no VB, orRUQ pain.  She plans on breast feeding. She request POP for birth control. She received her prenatal [facility] at [facility]   Dating: By 10 wk [place] --->  Estimated Date of Delivery: 04/29/23  Sono:    , CWD, normal anatomy, female 313g, 27% EFW  EFW=1714 15th% with AC 20th% -- BPP showed worsening poly at 33.5, dopplers not evlated RI 57%  Prenatal History/Complications:  Polyhydramnios (AFI 33.5) A1GDM Morbid obesity with BMI=Body mass index is 56.05 kg/m.  Excessive weight gain (70+lbs) Anxiety and Depression  Past Medical History: Past Medical History:  Diagnosis Date   Anxiety    Back pain    Depression    Gestational diabetes    Kidney stones    Mental disorder    Migraines    Pregnancy induced hypertension    Seizures    Sleep apnea    Vaginal Pap smear, abnormal     Past Surgical History: Past Surgical History:  Procedure Laterality Date   EXTRACORPOREAL SHOCK WAVE LITHOTRIPSY Right 03/19/2022   Procedure: EXTRACORPOREAL SHOCK WAVE LITHOTRIPSY (ESWL);  Surgeon: Malen Gauze, MD;  Location: [facility];  Service: Urology;  Laterality: Right;    Obstetrical History: OB History     Gravida  1   Para  0   Term  0   Preterm  0   AB  0   Living  0      SAB  0   IAB  0   Ectopic  0   Multiple  0   Live Births  0           Social History Social History   Socioeconomic History   Marital status: Single    Spouse name: Not on file   Number of children: Not on file   Years of education: Not on file   Highest education level: Not on file  Occupational History   Not on file  Tobacco  Use   Smoking status: Never   Smokeless tobacco: Never  Vaping Use   Vaping Use: Never used  Substance and Sexual Activity   Alcohol use: Not Currently    Comment: occas   Drug use: No   Sexual activity: Yes    Birth control/protection: None  Other Topics Concern   Not on file  Social History Narrative   Not on file   Social Determinants of Health   Financial Resource Strain: Low Risk  (10/28/2022)   Overall Financial Resource Strain (CARDIA)    Difficulty of Paying Living Expenses: Not very hard  Food Insecurity: No Food Insecurity (10/28/2022)   Hunger Vital Sign    Worried About Running Out of Food in the Last Year: Never true    Ran Out of Food in the Last Year: Never true  Transportation Needs: No Transportation Needs (10/28/2022)   PRAPARE - Administrator, Civil Service (Medical): No    Lack of Transportation (Non-Medical): No  Physical Activity: Insufficiently Active (10/28/2022)   Exercise Vital Sign    Days of Exercise per Week: 5 days    Minutes of Exercise per Session: 10 min  Stress: No Stress Concern Present (10/28/2022)   [employer] of Occupational Health - Occupational Stress Questionnaire    Feeling of Stress : Only a little  Social Connections: Moderately Integrated (10/28/2022)   Social Connection and Isolation Panel [NHANES]    Frequency of Communication with Friends and Family: More than three times a week    Frequency of Social Gatherings with Friends and Family: Three times a week    Attends Religious Services: 1 to 4 times per year    Active Member of Clubs or Organizations: No    Attends Banker Meetings: Never    Marital Status: Living with partner    Family History: Family History  Problem Relation Age of Onset   Other Mother        MS   Multiple sclerosis Mother    Cancer Mother    Breast cancer Mother    Diabetes Father    Other Maternal Grandmother        MS   Cancer Paternal Grandfather      Allergies: Allergies  Allergen Reactions   Penicillins Anaphylaxis   Sulfa Antibiotics Anaphylaxis   Gabapentin Other (See Comments)    tremors   Salicylic Acid Other (See Comments)    Medications Prior to Admission  Medication Sig Dispense Refill Last Dose   buPROPion (WELLBUTRIN XL) 150 MG 24 hr tablet Take 1 tablet (150 mg total) by mouth every morning. 30 tablet 0 04/01/2023   FLUoxetine (PROZAC) 40 MG capsule Take 80 mg by mouth daily.    04/01/2023   Prenatal Vit-Fe Fumarate-FA (PRENATAL VITAMIN PO) Take by mouth.   04/01/2023   Accu-Chek Softclix Lancets lancets Use as instructed to check blood sugar 4 times daily 100 each 12    Blood Glucose Monitoring Suppl (ACCU-CHEK GUIDE ME) w/Device KIT 1 each by Does not apply route 4 (four) times daily. 1 kit 0    EPINEPHrine 0.3 mg/0.3 mL IJ SOAJ injection Inject 0.3 mg into the muscle as needed for anaphylaxis.  (Patient not taking: Reported on 10/28/2022)      glucose blood (ACCU-CHEK GUIDE) test strip Use as instructed to check blood sugar 4 times daily 50 each 12      Review of Systems   All systems reviewed and negative except as stated in HPI  Blood pressure (!) 158/96, pulse 90, temperature (!) 97.3 F (36.3 C), temperature source Oral, resp. rate 15, height 5\' 5"  (1.651 m), weight (!) 152.8 kg, last menstrual period 07/23/2022, SpO2 98 %.  Patient Vitals for the past 24 hrs:  BP Temp Temp src Pulse Resp SpO2 Height Weight  04/01/23 2049 (!) 158/96 -- -- 90 -- -- -- --  04/01/23 2046 (!) 162/97 -- -- 81 -- -- -- --  04/01/23 2043 (!) 159/96 -- -- 81 -- -- -- --  04/01/23 2016 (!) 155/99 -- -- 79 -- -- -- --  04/01/23 2001 (!) 144/86 -- -- 82 -- -- -- --  04/01/23 1946 (!) 140/96 -- -- 67 -- -- -- --  04/01/23 1933 (!) 145/89 -- -- 45 -- -- -- --  04/01/23 1916 (!) 152/91 -- -- 46 -- -- -- --  04/01/23 1903 (!) 149/92 -- -- 55 -- -- -- --  04/01/23 1846 (!) 146/103 -- -- 82 -- -- -- --  04/01/23 1831 (!) 148/96 -- --  17 -- -- -- --  04/01/23 1816 (!) 155/92 -- -- 10 -- -- -- --  04/01/23 1812 (!) 154/96 -- --  89 -- 98 % -- --  04/01/23 1747 136/81 -- -- 79 -- 99 % -- --  04/01/23 1731 (!) 158/100 -- -- 83 -- -- -- --  04/01/23 1722 (!) 157/114 (!) 97.3 F (36.3 C) Oral 86 15 99 %  (1.651 m) (!) 152.8 kg    General appearance: alert, cooperative, and appears stated age Lungs: clear to auscultation bilaterally Heart: regular rate and rhythm Abdomen: soft, non-tender; bowel sounds normal Pelvic: adequeate Extremities: Homans sign is negative, no sign of DVT DTR's 2+ Presentation: cephalic- BSUS performed Fetal monitoringBaseline: 130 bpm, Variability: Good {> 6 bpm), Accelerations: Reactive, and Decelerations: Absent Uterine activityNone     Prenatal labs: ABO, Rh: O/Positive/-- (11/13 1539) Antibody: Negative (02/21 0804) Rubella: 13.00 (11/13 1539) RPR: Non Reactive (02/21 0804)  HBsAg: Negative (11/13 1539)  HIV: Non Reactive (02/21 0804)  GBS:     Prenatal Transfer Tool  Maternal Diabetes: Yes:  Diabetes Type:  Diet controlled Genetic Screening: Normal Maternal Ultrasounds/Referrals: Other: Fetal Ultrasounds or other Referrals:  Other: Polyhydramnios Maternal Substance Abuse:  No Significant Maternal Medications:  None Significant Maternal Lab Results:  Other: GBS unknown Number of Prenatal Visits:Less than or equal to 3 verified prenatal visits Other Comments:  None  Results for orders placed or performed during the hospital encounter of 04/01/23 (from the past 24 hour(s))  CBC   Collection Time: 04/01/23  5:39 PM  Result Value Ref Range   WBC 9.5 4.0 - 10.5 K/uL   RBC 4.10 3.87 - 5.11 MIL/uL   Hemoglobin 12.3 12.0 - 15.0 g/dL   HCT 40.9 81.1 - 91.4 %   MCV 89.0 80.0 - 100.0 fL   MCH 30.0 26.0 - 34.0 pg   MCHC 33.7 30.0 - 36.0 g/dL   RDW 78.2 95.6 - 21.3 %   Platelets 203 150 - 400 K/uL   nRBC 0.0 0.0 - 0.2 %  Comprehensive metabolic panel   Collection Time:  04/01/23  5:39 PM  Result Value Ref Range   Sodium 134 (L) 135 - 145 mmol/L   Potassium 4.1 3.5 - 5.1 mmol/L   Chloride 106 98 - 111 mmol/L   CO2 19 (L) 22 - 32 mmol/L   Glucose, Bld 80 70 - 99 mg/dL   BUN 19 6 - 20 mg/dL   Creatinine, Ser 0.86 0.44 - 1.00 mg/dL   Calcium 8.8 (L) 8.9 - 10.3 mg/dL   Total Protein 6.1 (L) 6.5 - 8.1 g/dL   Albumin 2.2 (L) 3.5 - 5.0 g/dL   AST 23 15 - 41 U/L   ALT 28 0 - 44 U/L   Alkaline Phosphatase 104 38 - 126 U/L   Total Bilirubin 0.6 0.3 - 1.2 mg/dL   GFR, Estimated >57 >84 mL/min   Anion gap 9 5 - 15  Protein / creatinine ratio, urine   Collection Time: 04/01/23  5:39 PM  Result Value Ref Range   Creatinine, Urine 56 mg/dL   Total Protein, Urine 9 mg/dL   Protein Creatinine Ratio 0.16 (H) 0.00 - 0.15 mg/mg[Cre]    Patient Active Problem List   Diagnosis Date Noted   Excessive weight gain during pregnancy 03/13/2023   Polyhydramnios 03/13/2023   Gestational hypertension 03/05/2023   GDM (gestational diabetes mellitus) 02/07/2023   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 G1P0000 at [redacted]w[redacted]d here for Preeclampsia with severe features (BP and HA with visual changes)  #Labor: Closed on exam. Plan for vaginal and oral cytotec. FB when appropriate. Discussed with patient and husband expectations for a longer labor given preterm and on magnesium to help manage expectations.  #Pain: Thinking about Epidural #FWB: Cat 1 #ID:  GBS collected, currently preterm. Will start clindamycin until result returns.  #MOF: breast #MOC:pop #Circ:  NA  #Preeclampsia with Severe Features: Start magnesium infusion. Labetalol protocol. Labs WNL  Federico Flake, MD  04/01/2023, 9:06 PM

## 2023-04-01 NOTE — Progress Notes (Signed)
Patient placed on nasal CPAP using auto titration mode (min 4, max 20) as per patient's home regimen. Patient tolerated well, is familiar with equipment and procedure.

## 2023-04-02 LAB — CBC WITH DIFFERENTIAL/PLATELET
Abs Immature Granulocytes: 0.03 10*3/uL (ref 0.00–0.07)
Basophils Absolute: 0 10*3/uL (ref 0.0–0.1)
Basophils Relative: 1 %
Eosinophils Absolute: 0.1 10*3/uL (ref 0.0–0.5)
Eosinophils Relative: 1 %
HCT: 36.4 % (ref 36.0–46.0)
Hemoglobin: 12.2 g/dL (ref 12.0–15.0)
Immature Granulocytes: 0 %
Lymphocytes Relative: 30 %
Lymphs Abs: 2.6 10*3/uL (ref 0.7–4.0)
MCH: 30 pg (ref 26.0–34.0)
MCHC: 33.5 g/dL (ref 30.0–36.0)
MCV: 89.4 fL (ref 80.0–100.0)
Monocytes Absolute: 0.8 10*3/uL (ref 0.1–1.0)
Monocytes Relative: 9 %
Neutro Abs: 5.1 10*3/uL (ref 1.7–7.7)
Neutrophils Relative %: 59 %
Platelets: 208 10*3/uL (ref 150–400)
RBC: 4.07 MIL/uL (ref 3.87–5.11)
RDW: 14.2 % (ref 11.5–15.5)
WBC: 8.6 10*3/uL (ref 4.0–10.5)
nRBC: 0 % (ref 0.0–0.2)

## 2023-04-02 LAB — COMPREHENSIVE METABOLIC PANEL
ALT: 27 U/L (ref 0–44)
AST: 21 U/L (ref 15–41)
Albumin: 2.1 g/dL — ABNORMAL LOW (ref 3.5–5.0)
Alkaline Phosphatase: 122 U/L (ref 38–126)
Anion gap: 7 (ref 5–15)
BUN: 15 mg/dL (ref 6–20)
CO2: 19 mmol/L — ABNORMAL LOW (ref 22–32)
Calcium: 7 mg/dL — ABNORMAL LOW (ref 8.9–10.3)
Chloride: 106 mmol/L (ref 98–111)
Creatinine, Ser: 0.83 mg/dL (ref 0.44–1.00)
GFR, Estimated: >60 mL/min (ref >60)
Glucose, Bld: 118 mg/dL — ABNORMAL HIGH (ref 70–99)
Potassium: 3.9 mmol/L (ref 3.5–5.1)
Sodium: 132 mmol/L — ABNORMAL LOW (ref 135–145)
Total Bilirubin: 0.5 mg/dL (ref 0.3–1.2)
Total Protein: 5.9 g/dL — ABNORMAL LOW (ref 6.5–8.1)

## 2023-04-02 LAB — GLUCOSE, CAPILLARY
Glucose-Capillary: 93 mg/dL (ref 70–99)
Glucose-Capillary: 108 mg/dL — ABNORMAL HIGH (ref 70–99)
Glucose-Capillary: 147 mg/dL — ABNORMAL HIGH (ref 70–99)
Glucose-Capillary: 135 mg/dL — ABNORMAL HIGH (ref 70–99)
Glucose-Capillary: 112 mg/dL — ABNORMAL HIGH (ref 70–99)
Glucose-Capillary: 114 mg/dL — ABNORMAL HIGH (ref 70–99)

## 2023-04-02 LAB — MAGNESIUM: Magnesium: 6.3 mg/dL (ref 1.7–2.4)

## 2023-04-02 LAB — RPR: RPR Ser Ql: NONREACTIVE

## 2023-04-02 MED ORDER — VANCOMYCIN HCL 10 G IV SOLR
2000.0000 mg | Freq: Three times a day (TID) | INTRAVENOUS | Status: DC
  Administered 2023-04-02 – 2023-04-03 (×4): 2000 mg via INTRAVENOUS
  Filled 2023-04-02: qty 20
  Filled 2023-04-02: qty 2000
  Filled 2023-04-02: qty 20
  Filled 2023-04-02 (×2): qty 2500
  Filled 2023-04-02: qty 2000
  Filled 2023-04-02: qty 20

## 2023-04-02 MED ORDER — ACETAMINOPHEN 500 MG PO TABS
1000.0000 mg | ORAL_TABLET | Freq: Once | ORAL | Status: AC
  Administered 2023-04-02: 1000 mg via ORAL
  Filled 2023-04-02: qty 2

## 2023-04-02 MED ORDER — ZOLPIDEM TARTRATE 5 MG PO TABS: 5.0000 mg | ORAL_TABLET | Freq: Every evening | ORAL | Status: DC | PRN

## 2023-04-02 MED ORDER — VANCOMYCIN HCL IN DEXTROSE 1-5 GM/200ML-% IV SOLN
1000.0000 mg | Freq: Once | INTRAVENOUS | Status: AC
  Administered 2023-04-02: 1000 mg via INTRAVENOUS
  Filled 2023-04-02: qty 200

## 2023-04-02 MED ORDER — FENTANYL-BUPIVACAINE-NACL 0.5-0.125-0.9 MG/250ML-% EP SOLN
12.0000 mL/h | EPIDURAL | Status: DC | PRN
  Administered 2023-04-03 (×2): 12 mL/h via EPIDURAL
  Filled 2023-04-02 (×2): qty 250

## 2023-04-02 MED ORDER — CEFAZOLIN SODIUM-DEXTROSE 1-4 GM/50ML-% IV SOLN: 1.0000 g | Freq: Three times a day (TID) | INTRAVENOUS | Status: DC

## 2023-04-02 MED ORDER — DIPHENHYDRAMINE HCL 50 MG/ML IJ SOLN
25.0000 mg | Freq: Once | INTRAMUSCULAR | Status: AC
  Administered 2023-04-02: 25 mg via INTRAVENOUS
  Filled 2023-04-02: qty 1

## 2023-04-02 MED ORDER — CEFAZOLIN SODIUM-DEXTROSE 2-4 GM/100ML-% IV SOLN: 2.0000 g | Freq: Once | INTRAVENOUS | Status: DC

## 2023-04-02 MED ORDER — DIPHENHYDRAMINE HCL 50 MG/ML IJ SOLN: 12.5000 mg | INTRAMUSCULAR | Status: DC | PRN

## 2023-04-02 MED ORDER — EPHEDRINE 5 MG/ML INJ: 10.0000 mg | INTRAVENOUS | Status: DC | PRN

## 2023-04-02 MED ORDER — MISOPROSTOL 25 MCG QUARTER TABLET
25.0000 ug | ORAL_TABLET | ORAL | Status: DC | PRN
  Administered 2023-04-02: 25 ug via VAGINAL
  Filled 2023-04-02: qty 1

## 2023-04-02 MED ORDER — TERBUTALINE SULFATE 1 MG/ML IJ SOLN: 0.2500 mg | Freq: Once | INTRAMUSCULAR | Status: DC | PRN

## 2023-04-02 MED ORDER — PHENYLEPHRINE 80 MCG/ML (10ML) SYRINGE FOR IV PUSH (FOR BLOOD PRESSURE SUPPORT)
80.0000 ug | PREFILLED_SYRINGE | INTRAVENOUS | Status: DC | PRN
  Filled 2023-04-02: qty 10

## 2023-04-02 MED ORDER — MISOPROSTOL 50MCG HALF TABLET
50.0000 ug | ORAL_TABLET | Freq: Once | ORAL | Status: AC
  Administered 2023-04-02: 50 ug via BUCCAL
  Filled 2023-04-02: qty 1

## 2023-04-02 MED ORDER — LACTATED RINGERS IV SOLN: 500.0000 mL | Freq: Once | INTRAVENOUS | Status: DC

## 2023-04-02 MED ORDER — PHENYLEPHRINE 80 MCG/ML (10ML) SYRINGE FOR IV PUSH (FOR BLOOD PRESSURE SUPPORT)
80.0000 ug | PREFILLED_SYRINGE | INTRAVENOUS | Status: DC | PRN
  Administered 2023-04-03: 80 ug via INTRAVENOUS

## 2023-04-02 MED ORDER — FENTANYL CITRATE (PF) 100 MCG/2ML IJ SOLN
50.0000 ug | INTRAMUSCULAR | Status: DC | PRN
  Administered 2023-04-02 (×4): 100 ug via INTRAVENOUS
  Administered 2023-04-02: 50 ug via INTRAVENOUS
  Administered 2023-04-02 (×6): 100 ug via INTRAVENOUS
  Administered 2023-04-05: 50 ug via INTRAVENOUS
  Filled 2023-04-02 (×13): qty 2

## 2023-04-02 MED ORDER — MISOPROSTOL 25 MCG QUARTER TABLET
25.0000 ug | ORAL_TABLET | ORAL | Status: DC | PRN
  Administered 2023-04-02 (×2): 25 ug via VAGINAL
  Filled 2023-04-02 (×2): qty 1

## 2023-04-02 NOTE — Progress Notes (Signed)
MD at bedside. Attempted to readjust fetal and toco monitoring. Patient requests primary nurse to step out to speak to the MD before monitors could be readjusted.  MD aware patient is not on the monitor. Secondary nurse who assisted in placing wireless [place] monitor on patient for fetal monitoring evaluation asked to step out as well. Will continue to monitor patient.

## 2023-04-02 NOTE — Progress Notes (Signed)
Labor Progress Note  Angelica Crawford is a 37 y.o. G1P0000 at [redacted]w[redacted]d presented for IOL for severe preeclampsia (BP and HA/visual changes).  S: Patient is resting comfortably.    O:  BP 120/62   Pulse 74   Temp (!) 97.5 F (36.4 C) (Oral)   Resp 18   Ht  (1.651 m)   Wt (!) 152.8 kg   LMP 07/23/2022 (Approximate)   SpO2 98%   BMI 56.05 kg/m  EFM: 130 bpm/Minimal and moderate variability/10x10 accels / None decels  CVE: Dilation: Closed Effacement (%): Thick Cervical Position: Posterior Station: Ballotable Presentation: Vertex Exam by:: Dr. Lanae Crumbly  A&P: 37 y.o. G1P0000 [redacted]w[redacted]d IOL for severe preeclampsia.  #Labor: SVE performed. Cervix size unchanged. Will plan to reexamine in ~4 hrs after 3rd dose of cytotec. Will consider a FB if cervical dilation is sufficient. Encouraged pt to walk around the unit and bounce on the ball.  #Pain: Family support #FWB: Cat 1 #GBS positive. Due to penicillin allergy, on vancomycin  #Severe Pre-E: On Mg. Pre-E labs WNL. BP currently WNL. Will continue to monitor BP closely.  #A1GDM: q4 glucose monitoring. Last BGC 108  [name], Medical Student 8:10 AM

## 2023-04-02 NOTE — Inpatient Diabetes Management (Signed)
Inpatient Diabetes Program Recommendations  Diabetes Treatment Program Recommendations  ADA Standards of Care Diabetes in Pregnancy Target Glucose Ranges:  Fasting: 70 - 95 mg/dL 1 hr postprandial: Less than /dL (from first bite of meal) 2 hr postprandial: Less than 120 mg/dL (from first bite of meal)      Latest Reference Range & Units 04/02/23 03:49 04/02/23 07:57  Glucose-Capillary 70 - 99 mg/dL 93 161 (H)    Review of Glycemic Control  Diabetes history: GDM; [redacted]W[redacted]D Outpatient Diabetes medications: None; diet controlled Current orders for Inpatient glycemic control: CBGs Q4H  Inpatient Diabetes Program Recommendations:    Insulin: May want to consider ordering Novolog 0-14 units Q4H.   NOTE: Noted consult for diabetes coordinator. Chart reviewed. Patient [redacted]W[redacted]D gestation, hx of GDM which is diet controlled, and admitted for IOL. Glucose 108 this am. May want to consider ordering Novolog 0-14 units Q4H for correction during labor. Anticipate glucose to return to prepregnancy baseline following delivery. Will follow along while inpatient.  Thanks, Orlando Penner, RN, MSN, CDCES Diabetes Coordinator Inpatient Diabetes Program [phone] (Team Pager from 8am to 5pm)

## 2023-04-02 NOTE — Progress Notes (Signed)
Labor Progress Note KENT BRAUNSCHWEIG is a 37 y.o. G1P0000 at [redacted]w[redacted]d presented for IOL for pre-eclampsia with severe features S: Having cramping from contractions.  O:  BP 136/81   Pulse 85   Temp (!) 97.5 F (36.4 C) (Oral)   Resp 17   Ht  (1.651 m)   Wt (!) 152.8 kg   LMP 07/23/2022 (Approximate)   SpO2 98%   BMI 56.05 kg/m   EFM: 120 bpm, /moderate /+accels, no decels  Contractions every 2- 4 mins  CVE: Dilation: Closed Effacement (%): Thick Cervical Position: Posterior Station: Ballotable Presentation: Vertex (By ultrasound) Exam by:: Dr. Lanae Crumbly   A&P: 37 y.o. G1P0000 [redacted]w[redacted]d IOL for pre-eclampsia with SF #Labor: s./p cytotec X 4 rounds. Failed cervical balloon attempt. Plan to re attempt cervical balloon at 1600 #Pain: partner support, fentanyl, epidural when more active #FWB: Cat 1 #GBS positive - on vancomycin  Pre-eclampsia with SF: blood pressures stable. On mag sulf. Labs planned for 1400  Sheppard Evens MD MPH OB Fellow, Faculty Practice [facility], Center for [facility] 04/02/2023

## 2023-04-02 NOTE — Progress Notes (Signed)
Labor Progress Note  Angelica Crawford is a 37 y.o. G1P0000 at [redacted]w[redacted]d presented for IOL for severe preeclampsia (BP and HA/visual changes).  S: Patient is resting. Patient reports a rash on bilateral arms, pruritus and itchy throat. Patient also still endorses HA and floaters.   O:  BP 132/75   Pulse 73   Temp (!) 97.5 F (36.4 C) (Oral)   Resp 18   Ht  (1.651 m)   Wt (!) 152.8 kg   LMP 07/23/2022 (Approximate)   SpO2 98%   BMI 56.05 kg/m  EFM: 135 bpm/Moderate variability/None accels / None decels  CVE: Dilation: Closed Effacement (%): Thick Cervical Position: Posterior Station: Ballotable Presentation: Vertex Exam by:: Dr. Camelia Phenes  A&P: 37 y.o. G1P0000 [redacted]w[redacted]d IOL for severe preeclampsia.  #Labor: Due to minimal change in cervix size ~4 hrs post-cytotec, FB was not placed. Plan to reexamine in ~4 hrs after another dose of cytotec. Will consider a FB if cervical dilation is sufficient.  #Pain: Family support #FWB: Cat 1 #GBS positive. Due to penicillin allergy, pt started on vancomycin. Given pt's rash, suspect Red Man's Syndrome. Given patient is not tachycardic, hypotensive or experiencing laryngeal spasms, do not think this is an anaphylactic reaction. Will still infuse vancomycin but at a decreased rate and add benadryl.  #Severe Pre-E: On Mg. BP currently wnl. Since patient is still experiencing HA, will increase tylenol to 1000 mg.  #A1GDM: q4 glucose monitoring  Angelica Crawford, Medical Student 3:28 AM

## 2023-04-02 NOTE — Progress Notes (Signed)
Dr. Camelia Phenes at bedside attempting cook's catheter insertion. Pt off monitor during this procedure.

## 2023-04-02 NOTE — Progress Notes (Addendum)
Labor Progress Note  Angelica Crawford is a 37 y.o. G1P0000 at [redacted]w[redacted]d presented for IOL for severe preeclampsia (BP and HA/visual changes).  S: Patient requested to speak with doctor privately. Patient expressed concerns regarding pain management and fetal monitoring communication. Patient requested a change in care team.   O:  BP (!) 144/88   Pulse 77   Temp 97.9 F (36.6 C) (Oral)   Resp 18   Ht  (1.651 m)   Wt (!) 152.8 kg   LMP 07/23/2022 (Approximate)   SpO2 98%   BMI 56.05 kg/m  EFM: 120 bpm/Moderate variability/15x15 accels / Variable decels  CVE: Dilation: Closed Effacement (%): Thick Cervical Position: Posterior Station: Ballotable Presentation: Vertex Exam by:: Ronnell Freshwater  A&P: 37 y.o. G1P0000 [redacted]w[redacted]d IOL for severe preeclampsia.  #Labor: Cook catheter placed. Uterine bulb inflated with 60 cc of fluid and vaginal bulb inflated with 40 cc of fluid. Traction in place. Will increase traction q hour and monitor bulb.  #Pain: Family support, fentanyl; epidural planning  #FWB: Cat 1 #GBS positive. Due to penicillin allergy, on vancomycin  #Severe Pre-E: On Mg. Pre-E labs WNL. BP slightly elevated but not severe. Will continue to monitor BP closely.  #A1GDM: q4 glucose monitoring. Last BGC 114  [name], Medical Student 11:11 PM  __ [name] of Supervision of Student:  I confirm that I have verified the information documented in the medical student's note and that I have also personally performed the history, physical exam and all medical decision making activities.  I have verified that all services and findings are accurately documented in this student's note; and I agree with management and plan as outlined in the documentation. I have also made any necessary editorial changes.  Pt advocated for constant and detailed communication regarding treatment plan and any changes in management. Treatment team heard her concerns and made it a point to communicate  clearly and continuously moving forward. Pt expressed gratitude for validation and communicated understanding of plans for continued induction of labor. Placed cook catheter after gentle cervical dilation with hank cervical dilators. SROM clear at 2315.  [name], DO Center for [employer], [facility] 04/02/2023 11:17 PM

## 2023-04-02 NOTE — Plan of Care (Signed)
  Problem: Education: Goal: Knowledge of disease or condition will improve Outcome: Progressing Goal: Knowledge of the prescribed therapeutic regimen will improve Outcome: Progressing   Problem: Fluid Volume: Goal: Peripheral tissue perfusion will improve Outcome: Progressing   Problem: Clinical Measurements: Goal: Complications related to disease process, condition or treatment will be avoided or minimized Outcome: Progressing   Problem: Education: Goal: Knowledge of Childbirth will improve Outcome: Progressing Goal: Ability to make informed decisions regarding treatment and plan of care will improve Outcome: Progressing Goal: Ability to state and carry out methods to decrease the pain will improve Outcome: Progressing Goal: Individualized Educational Video(s) Outcome: Progressing   Problem: Coping: Goal: Ability to verbalize concerns and feelings about labor and delivery will improve Outcome: Progressing   Problem: Life Cycle: Goal: Ability to make normal progression through stages of labor will improve Outcome: Progressing Goal: Ability to effectively push during vaginal delivery will improve Outcome: Progressing   Problem: Role Relationship: Goal: Will demonstrate positive interactions with the child Outcome: Progressing   Problem: Safety: Goal: Risk of complications during labor and delivery will decrease Outcome: Progressing   Problem: Pain Management: Goal: Relief or control of pain from uterine contractions will improve Outcome: Progressing   Problem: Education: Goal: Knowledge of General Education information will improve Description: Including pain rating scale, medication(s)/side effects and non-pharmacologic comfort measures Outcome: Progressing   Problem: Health Behavior/Discharge Planning: Goal: Ability to manage health-related needs will improve Outcome: Progressing   Problem: Clinical Measurements: Goal: Ability to maintain clinical measurements  within normal limits will improve Outcome: Progressing Goal: Will remain free from infection Outcome: Progressing Goal: Diagnostic test results will improve Outcome: Progressing Goal: Respiratory complications will improve Outcome: Progressing Goal: Cardiovascular complication will be avoided Outcome: Progressing   Problem: Activity: Goal: Risk for activity intolerance will decrease Outcome: Progressing   Problem: Nutrition: Goal: Adequate nutrition will be maintained Outcome: Progressing   Problem: Coping: Goal: Level of anxiety will decrease Outcome: Progressing   Problem: Elimination: Goal: Will not experience complications related to bowel motility Outcome: Progressing Goal: Will not experience complications related to urinary retention Outcome: Progressing   Problem: Pain Managment: Goal: General experience of comfort will improve Outcome: Progressing   Problem: Safety: Goal: Ability to remain free from injury will improve Outcome: Progressing   Problem: Skin Integrity: Goal: Risk for impaired skin integrity will decrease Outcome: Progressing   Problem: Education: Goal: Ability to describe self-care measures that may prevent or decrease complications (Diabetes Survival Skills Education) will improve Outcome: Progressing Goal: Individualized Educational Video(s) Outcome: Progressing   Problem: Coping: Goal: Ability to adjust to condition or change in health will improve Outcome: Progressing   Problem: Fluid Volume: Goal: Ability to maintain a balanced intake and output will improve Outcome: Progressing   Problem: Health Behavior/Discharge Planning: Goal: Ability to identify and utilize available resources and services will improve Outcome: Progressing Goal: Ability to manage health-related needs will improve Outcome: Progressing   Problem: Metabolic: Goal: Ability to maintain appropriate glucose levels will improve Outcome: Progressing   Problem:  Nutritional: Goal: Maintenance of adequate nutrition will improve Outcome: Progressing Goal: Progress toward achieving an optimal weight will improve Outcome: Progressing   Problem: Skin Integrity: Goal: Risk for impaired skin integrity will decrease Outcome: Progressing   Problem: Tissue Perfusion: Goal: Adequacy of tissue perfusion will improve Outcome: Progressing

## 2023-04-02 NOTE — Progress Notes (Signed)
Attempted cervical balloon insertion using a speculum,. Cervix visible, but could not get both foley balloon or cooks balloon in, despite multiple tries.  Patient tolerated it well Cervix: closed/thick. High  Next cytotec is due at 12 noon. Plan to give buccal , as last vaginal dose is yet to dissolve completely. Will consider trying for balloon and rechecking before 4 pm dose.  Sheppard Evens MD MPH OB Fellow, Faculty Practice [facility], Center for [facility] 04/02/2023

## 2023-04-03 ENCOUNTER — Inpatient Hospital Stay (HOSPITAL_COMMUNITY): Payer: BC Managed Care – PPO | Admitting: Anesthesiology

## 2023-04-03 ENCOUNTER — Encounter: Payer: BC Managed Care – PPO | Admitting: Obstetrics & Gynecology

## 2023-04-03 ENCOUNTER — Other Ambulatory Visit: Payer: Self-pay

## 2023-04-03 ENCOUNTER — Other Ambulatory Visit: Payer: BC Managed Care – PPO

## 2023-04-03 ENCOUNTER — Encounter (HOSPITAL_COMMUNITY): Payer: Self-pay | Admitting: Family Medicine

## 2023-04-03 ENCOUNTER — Encounter (HOSPITAL_COMMUNITY)
Admission: AD | Disposition: A | Discharge status: Home / Self Care | Payer: Self-pay | Source: Home / Self Care | Attending: Obstetrics and Gynecology

## 2023-04-03 DIAGNOSIS — Z3A36 36 weeks gestation of pregnancy: Secondary | ICD-10-CM

## 2023-04-03 DIAGNOSIS — Z98891 History of uterine scar from previous surgery: Secondary | ICD-10-CM

## 2023-04-03 DIAGNOSIS — O99214 Obesity complicating childbirth: Secondary | ICD-10-CM | POA: Diagnosis not present

## 2023-04-03 DIAGNOSIS — O403XX0 Polyhydramnios, third trimester, not applicable or unspecified: Secondary | ICD-10-CM | POA: Diagnosis not present

## 2023-04-03 DIAGNOSIS — O24420 Gestational diabetes mellitus in childbirth, diet controlled: Secondary | ICD-10-CM | POA: Diagnosis not present

## 2023-04-03 DIAGNOSIS — O99344 Other mental disorders complicating childbirth: Secondary | ICD-10-CM

## 2023-04-03 DIAGNOSIS — O1414 Severe pre-eclampsia complicating childbirth: Secondary | ICD-10-CM | POA: Diagnosis not present

## 2023-04-03 LAB — CBC WITH DIFFERENTIAL/PLATELET
Abs Immature Granulocytes: 0.02 10*3/uL (ref 0.00–0.07)
Basophils Absolute: 0 10*3/uL (ref 0.0–0.1)
Basophils Relative: 1 %
Eosinophils Absolute: 0.1 10*3/uL (ref 0.0–0.5)
Eosinophils Relative: 1 %
HCT: 35.9 % — ABNORMAL LOW (ref 36.0–46.0)
Hemoglobin: 12.1 g/dL (ref 12.0–15.0)
Immature Granulocytes: 0 %
Lymphocytes Relative: 29 %
Lymphs Abs: 2.5 10*3/uL (ref 0.7–4.0)
MCH: 30.4 pg (ref 26.0–34.0)
MCHC: 33.7 g/dL (ref 30.0–36.0)
MCV: 90.2 fL (ref 80.0–100.0)
Monocytes Absolute: 0.6 10*3/uL (ref 0.1–1.0)
Monocytes Relative: 7 %
Neutro Abs: 5.3 10*3/uL (ref 1.7–7.7)
Neutrophils Relative %: 62 %
Platelets: 200 10*3/uL (ref 150–400)
RBC: 3.98 MIL/uL (ref 3.87–5.11)
RDW: 14.1 % (ref 11.5–15.5)
WBC: 8.6 10*3/uL (ref 4.0–10.5)
nRBC: 0 % (ref 0.0–0.2)

## 2023-04-03 LAB — COMPREHENSIVE METABOLIC PANEL
ALT: 26 U/L (ref 0–44)
ALT: 23 U/L (ref 0–44)
AST: 25 U/L (ref 15–41)
AST: 20 U/L (ref 15–41)
Albumin: 2 g/dL — ABNORMAL LOW (ref 3.5–5.0)
Albumin: 1.9 g/dL — ABNORMAL LOW (ref 3.5–5.0)
Alkaline Phosphatase: 116 U/L (ref 38–126)
Alkaline Phosphatase: 109 U/L (ref 38–126)
Anion gap: 9 (ref 5–15)
Anion gap: 7 (ref 5–15)
BUN: 16 mg/dL (ref 6–20)
BUN: 15 mg/dL (ref 6–20)
CO2: 18 mmol/L — ABNORMAL LOW (ref 22–32)
CO2: 18 mmol/L — ABNORMAL LOW (ref 22–32)
Calcium: 6.5 mg/dL — ABNORMAL LOW (ref 8.9–10.3)
Calcium: 6.2 mg/dL — CL (ref 8.9–10.3)
Chloride: 104 mmol/L (ref 98–111)
Chloride: 105 mmol/L (ref 98–111)
Creatinine, Ser: 1 mg/dL (ref 0.44–1.00)
Creatinine, Ser: 0.96 mg/dL (ref 0.44–1.00)
GFR, Estimated: >60 mL/min (ref >60)
GFR, Estimated: >60 mL/min (ref >60)
Glucose, Bld: 102 mg/dL — ABNORMAL HIGH (ref 70–99)
Glucose, Bld: 131 mg/dL — ABNORMAL HIGH (ref 70–99)
Potassium: 4.8 mmol/L (ref 3.5–5.1)
Potassium: 4.2 mmol/L (ref 3.5–5.1)
Sodium: 131 mmol/L — ABNORMAL LOW (ref 135–145)
Sodium: 130 mmol/L — ABNORMAL LOW (ref 135–145)
Total Bilirubin: 0.7 mg/dL (ref 0.3–1.2)
Total Bilirubin: 0.6 mg/dL (ref 0.3–1.2)
Total Protein: 5.7 g/dL — ABNORMAL LOW (ref 6.5–8.1)
Total Protein: 5.3 g/dL — ABNORMAL LOW (ref 6.5–8.1)

## 2023-04-03 LAB — CBC
HCT: 35.7 % — ABNORMAL LOW (ref 36.0–46.0)
Hemoglobin: 11.5 g/dL — ABNORMAL LOW (ref 12.0–15.0)
MCH: 29.5 pg (ref 26.0–34.0)
MCHC: 32.2 g/dL (ref 30.0–36.0)
MCV: 91.5 fL (ref 80.0–100.0)
Platelets: 205 10*3/uL (ref 150–400)
RBC: 3.9 MIL/uL (ref 3.87–5.11)
RDW: 14.2 % (ref 11.5–15.5)
WBC: 8.5 10*3/uL (ref 4.0–10.5)
nRBC: 0 % (ref 0.0–0.2)

## 2023-04-03 LAB — GLUCOSE, CAPILLARY
Glucose-Capillary: 125 mg/dL — ABNORMAL HIGH (ref 70–99)
Glucose-Capillary: 77 mg/dL (ref 70–99)
Glucose-Capillary: 88 mg/dL (ref 70–99)
Glucose-Capillary: 94 mg/dL (ref 70–99)
Glucose-Capillary: 95 mg/dL (ref 70–99)

## 2023-04-03 LAB — MAGNESIUM
Magnesium: 6.9 mg/dL (ref 1.7–2.4)
Magnesium: 6.8 mg/dL (ref 1.7–2.4)

## 2023-04-03 LAB — VANCOMYCIN, TROUGH: Vancomycin Tr: 55 ug/mL (ref 15–20)

## 2023-04-03 SURGERY — Surgical Case
Anesthesia: Epidural

## 2023-04-03 MED ORDER — DIPHENHYDRAMINE HCL 25 MG PO CAPS: 25.0000 mg | ORAL_CAPSULE | ORAL | Status: DC | PRN

## 2023-04-03 MED ORDER — STERILE WATER FOR IRRIGATION IR SOLN
Status: DC | PRN
  Administered 2023-04-03: 1000 mL

## 2023-04-03 MED ORDER — OXYTOCIN-SODIUM CHLORIDE 30-0.9 UT/500ML-% IV SOLN
1.0000 m[IU]/min | INTRAVENOUS | Status: DC
  Administered 2023-04-03: 2 m[IU]/min via INTRAVENOUS

## 2023-04-03 MED ORDER — KETOROLAC TROMETHAMINE 30 MG/ML IJ SOLN: 30.0000 mg | Freq: Four times a day (QID) | INTRAMUSCULAR | Status: AC | PRN

## 2023-04-03 MED ORDER — LIDOCAINE HCL (PF) 1 % IJ SOLN
INTRAMUSCULAR | Status: DC | PRN
  Administered 2023-04-03: 4 mL via EPIDURAL
  Administered 2023-04-03: 5 mL via EPIDURAL

## 2023-04-03 MED ORDER — CLINDAMYCIN PHOSPHATE 900 MG/50ML IV SOLN
900.0000 mg | INTRAVENOUS | Status: AC
  Administered 2023-04-03: 900 mg via INTRAVENOUS

## 2023-04-03 MED ORDER — TRANEXAMIC ACID-NACL 1000-0.7 MG/100ML-% IV SOLN
1000.0000 mg | Freq: Once | INTRAVENOUS | Status: AC
  Administered 2023-04-03: 1000 mg via INTRAVENOUS

## 2023-04-03 MED ORDER — LIDOCAINE-EPINEPHRINE (PF) 2 %-1:200000 IJ SOLN
INTRAMUSCULAR | Status: DC | PRN
  Administered 2023-04-03 (×2): 5 mL via EPIDURAL

## 2023-04-03 MED ORDER — KETOROLAC TROMETHAMINE 30 MG/ML IJ SOLN
INTRAMUSCULAR | Status: AC
  Filled 2023-04-03: qty 1

## 2023-04-03 MED ORDER — GENTAMICIN SULFATE 40 MG/ML IJ SOLN
5.0000 mg/kg | INTRAVENOUS | Status: AC
  Administered 2023-04-03: 476.4 mg via INTRAVENOUS
  Filled 2023-04-03: qty 12

## 2023-04-03 MED ORDER — MORPHINE SULFATE (PF) 0.5 MG/ML IJ SOLN
INTRAMUSCULAR | Status: DC | PRN
  Administered 2023-04-03: 3 mg via EPIDURAL

## 2023-04-03 MED ORDER — TERBUTALINE SULFATE 1 MG/ML IJ SOLN: 0.2500 mg | Freq: Once | INTRAMUSCULAR | Status: DC | PRN

## 2023-04-03 MED ORDER — CALCIUM GLUCONATE-NACL 1-0.675 GM/50ML-% IV SOLN
1.0000 g | Freq: Once | INTRAVENOUS | Status: AC
  Administered 2023-04-03: 1000 mg via INTRAVENOUS
  Filled 2023-04-03: qty 50

## 2023-04-03 MED ORDER — DEXAMETHASONE SODIUM PHOSPHATE 4 MG/ML IJ SOLN
INTRAMUSCULAR | Status: AC
  Filled 2023-04-03: qty 1

## 2023-04-03 MED ORDER — NALOXONE HCL 0.4 MG/ML IJ SOLN: 0.4000 mg | INTRAMUSCULAR | Status: DC | PRN

## 2023-04-03 MED ORDER — FUROSEMIDE 10 MG/ML IJ SOLN: INTRAMUSCULAR | Status: DC | PRN

## 2023-04-03 MED ORDER — FENTANYL CITRATE (PF) 100 MCG/2ML IJ SOLN: 25.0000 ug | INTRAMUSCULAR | Status: DC | PRN

## 2023-04-03 MED ORDER — FENTANYL CITRATE (PF) 100 MCG/2ML IJ SOLN
INTRAMUSCULAR | Status: DC | PRN
  Administered 2023-04-03: 100 ug via EPIDURAL

## 2023-04-03 MED ORDER — ONDANSETRON HCL 4 MG/2ML IJ SOLN
INTRAMUSCULAR | Status: AC
  Filled 2023-04-03: qty 2

## 2023-04-03 MED ORDER — FUROSEMIDE 10 MG/ML IJ SOLN
INTRAMUSCULAR | Status: AC
  Filled 2023-04-03: qty 2

## 2023-04-03 MED ORDER — MORPHINE SULFATE (PF) 0.5 MG/ML IJ SOLN
INTRAMUSCULAR | Status: AC
  Filled 2023-04-03: qty 10

## 2023-04-03 MED ORDER — SODIUM CHLORIDE 0.9 % IV SOLN: INTRAVENOUS | Status: DC | PRN

## 2023-04-03 MED ORDER — FENTANYL CITRATE (PF) 100 MCG/2ML IJ SOLN
INTRAMUSCULAR | Status: AC
  Filled 2023-04-03: qty 2

## 2023-04-03 MED ORDER — DEXAMETHASONE SODIUM PHOSPHATE 4 MG/ML IJ SOLN
INTRAMUSCULAR | Status: DC | PRN
  Administered 2023-04-03: 4 mg via INTRAVENOUS

## 2023-04-03 MED ORDER — SODIUM CHLORIDE 0.9 % IV SOLN
500.0000 mg | INTRAVENOUS | Status: AC
  Administered 2023-04-03: 500 mg via INTRAVENOUS

## 2023-04-03 MED ORDER — OXYTOCIN-SODIUM CHLORIDE 30-0.9 UT/500ML-% IV SOLN
INTRAVENOUS | Status: DC | PRN
  Administered 2023-04-03: 30 [IU] via INTRAVENOUS

## 2023-04-03 MED ORDER — POVIDONE-IODINE 10 % EX SWAB: 2.0000 | Freq: Once | CUTANEOUS | Status: DC

## 2023-04-03 MED ORDER — SODIUM CHLORIDE 0.9% FLUSH: 3.0000 mL | INTRAVENOUS | Status: DC | PRN

## 2023-04-03 MED ORDER — ACETAMINOPHEN 500 MG PO TABS
1000.0000 mg | ORAL_TABLET | Freq: Four times a day (QID) | ORAL | Status: AC
  Administered 2023-04-04 (×4): 1000 mg via ORAL
  Filled 2023-04-03 (×5): qty 2

## 2023-04-03 MED ORDER — ONDANSETRON HCL 4 MG/2ML IJ SOLN
INTRAMUSCULAR | Status: DC | PRN
  Administered 2023-04-03: 4 mg via INTRAVENOUS

## 2023-04-03 MED ORDER — DIPHENHYDRAMINE HCL 50 MG/ML IJ SOLN: 12.5000 mg | INTRAMUSCULAR | Status: DC | PRN

## 2023-04-03 MED ORDER — FUROSEMIDE 10 MG/ML IJ SOLN
INTRAMUSCULAR | Status: DC | PRN
  Administered 2023-04-03: 20 mg via INTRAVENOUS

## 2023-04-03 MED ORDER — NALOXONE HCL 4 MG/10ML IJ SOLN: 1.0000 ug/kg/h | INTRAVENOUS | Status: DC | PRN

## 2023-04-03 MED ORDER — KETOROLAC TROMETHAMINE 30 MG/ML IJ SOLN
INTRAMUSCULAR | Status: DC | PRN
  Administered 2023-04-03: 30 mg via INTRAVENOUS

## 2023-04-03 MED ORDER — ONDANSETRON HCL 4 MG/2ML IJ SOLN: 4.0000 mg | Freq: Three times a day (TID) | INTRAMUSCULAR | Status: DC | PRN

## 2023-04-03 MED ORDER — DROPERIDOL 2.5 MG/ML IJ SOLN: 0.6250 mg | Freq: Once | INTRAMUSCULAR | Status: DC | PRN

## 2023-04-03 MED ORDER — ACETAMINOPHEN 500 MG PO TABS: 1000.0000 mg | ORAL_TABLET | Freq: Once | ORAL | Status: DC

## 2023-04-03 MED ORDER — ACETAMINOPHEN 160 MG/5ML PO SOLN: 1000.0000 mg | Freq: Once | ORAL | Status: DC

## 2023-04-03 MED ORDER — LACTATED RINGERS IV SOLN: INTRAVENOUS | Status: DC | PRN

## 2023-04-03 MED ORDER — LORAZEPAM 0.5 MG PO TABS
1.0000 mg | ORAL_TABLET | Freq: Once | ORAL | Status: AC
  Administered 2023-04-03: 1 mg via ORAL
  Filled 2023-04-03: qty 2

## 2023-04-03 MED ORDER — KETOROLAC TROMETHAMINE 30 MG/ML IJ SOLN
30.0000 mg | Freq: Once | INTRAMUSCULAR | Status: AC
  Administered 2023-04-03: 30 mg via INTRAVENOUS

## 2023-04-03 SURGICAL SUPPLY — 46 items
BENZOIN TINCTURE PRP APPL 2/3 (GAUZE/BANDAGES/DRESSINGS) ×1 IMPLANT
CHLORAPREP W/TINT 26 (MISCELLANEOUS) ×2 IMPLANT
CLAMP UMBILICAL CORD (MISCELLANEOUS) ×1 IMPLANT
CLOTH BEACON ORANGE TIMEOUT ST (SAFETY) ×1 IMPLANT
DRAPE C SECTION CLR SCREEN (DRAPES) IMPLANT
DRSG OPSITE POSTOP 4X10 (GAUZE/BANDAGES/DRESSINGS) ×1 IMPLANT
DRSG OPSITE POSTOP 4X12 (GAUZE/BANDAGES/DRESSINGS) IMPLANT
ELECT REM PT RETURN 9FT ADLT (ELECTROSURGICAL) ×1
ELECTRODE REM PT RTRN 9FT ADLT (ELECTROSURGICAL) ×1 IMPLANT
EXTRACTOR VACUUM M CUP 4 TUBE (SUCTIONS) IMPLANT
GAUZE PAD ABD 7.5X8 STRL (GAUZE/BANDAGES/DRESSINGS) IMPLANT
GAUZE SPONGE 4X4 12PLY STRL LF (GAUZE/BANDAGES/DRESSINGS) IMPLANT
GLOVE BIO SURGEON STRL SZ7.5 (GLOVE) ×1 IMPLANT
GLOVE BIOGEL PI IND STRL 7.0 (GLOVE) ×2 IMPLANT
GOWN STRL REUS W/TWL 2XL LVL3 (GOWN DISPOSABLE) ×1 IMPLANT
GOWN STRL REUS W/TWL LRG LVL3 (GOWN DISPOSABLE) ×2 IMPLANT
KIT ABG SYR 3ML LUER SLIP (SYRINGE) IMPLANT
NDL HYPO 25X5/8 SAFETYGLIDE (NEEDLE) IMPLANT
NEEDLE HYPO 22GX1.5 SAFETY (NEEDLE) ×1 IMPLANT
NEEDLE HYPO 25X5/8 SAFETYGLIDE (NEEDLE) IMPLANT
NS IRRIG 1000ML POUR BTL (IV SOLUTION) ×1 IMPLANT
PACK C SECTION WH (CUSTOM PROCEDURE TRAY) ×1 IMPLANT
PAD OB MATERNITY 4.3X12.25 (PERSONAL CARE ITEMS) ×1 IMPLANT
RTRCTR C-SECT PINK 25CM LRG (MISCELLANEOUS) ×1 IMPLANT
STRIP CLOSURE SKIN 1/2X4 (GAUZE/BANDAGES/DRESSINGS) ×1 IMPLANT
SUT CHROMIC 1 CTX 36 (SUTURE) ×2 IMPLANT
SUT MNCRL 0 VIOLET CTX 36 (SUTURE) IMPLANT
SUT MNCRL+ AB 3-0 CT1 36 (SUTURE) IMPLANT
SUT MONOCRYL 0 CTX 36 (SUTURE) ×3
SUT MONOCRYL AB 3-0 CT1 36IN (SUTURE) ×3
SUT PLAIN 0 NONE (SUTURE) IMPLANT
SUT PLAIN 2 0 XLH (SUTURE) IMPLANT
SUT VIC AB 0 CT1 36 (SUTURE) IMPLANT
SUT VIC AB 1 CT1 36 (SUTURE) ×1 IMPLANT
SUT VIC AB 2-0 CT1 (SUTURE) ×1 IMPLANT
SUT VIC AB 2-0 CT1 27 (SUTURE) ×1
SUT VIC AB 2-0 CT1 TAPERPNT 27 (SUTURE) ×1 IMPLANT
SUT VIC AB 3-0 CT1 27 (SUTURE) ×1
SUT VIC AB 3-0 CT1 TAPERPNT 27 (SUTURE) ×1 IMPLANT
SUT VIC AB 3-0 SH 27 (SUTURE)
SUT VIC AB 3-0 SH 27X BRD (SUTURE) IMPLANT
SUT VIC AB 4-0 KS 27 (SUTURE) ×1 IMPLANT
SYR BULB IRRIGATION 50ML (SYRINGE) IMPLANT
TOWEL OR 17X24 6PK STRL BLUE (TOWEL DISPOSABLE) ×1 IMPLANT
TRAY FOLEY W/BAG SLVR 14FR LF (SET/KITS/TRAYS/PACK) ×1 IMPLANT
WATER STERILE IRR 1000ML POUR (IV SOLUTION) ×1 IMPLANT

## 2023-04-03 NOTE — Progress Notes (Addendum)
Labor Progress Note  Angelica Crawford is a 37 y.o. G1P0000 at [redacted]w[redacted]d presented for IOL for severe preeclampsia (BP and HA/visual changes).  S: No acute, initially sleeping.   O:  BP (!) 146/83   Pulse 67   Temp 97.6 F (36.4 C) (Oral)   Resp 17   Ht  (1.651 m)   Wt (!) 152.8 kg   LMP 07/23/2022 (Approximate)   SpO2 99%   BMI 56.05 kg/m  EFM: 120 bpm/Moderate variability/15x15 accels / Variable decels  CVE: Dilation: 2 Effacement (%): 70 Cervical Position: Posterior Station: -3 Presentation: Vertex Exam by:: Gillen, RN  A&P: 37 y.o. G1P0000 [redacted]w[redacted]d IOL for severe preeclampsia.  #Labor: s/p Cook, pit 2x2. AROM with coupious clear/yellow fluid. FSE and IUPC placed. Continue to titrate pitocin appropriately #Pain: epidural #FWB: Cat 1 #GBS positive. Due to penicillin allergy, on vancomycin  #Severe Pre-E: On Mg. Pre-E labs WNL. BP slightly elevated but not severe. Will continue to monitor BP closely. Labs pending for 2PM.  #A1GDM: q4 glucose monitoring. Last CBG 95  Fina Heizer Autry-Lott, DO 9:04 AM

## 2023-04-03 NOTE — Progress Notes (Signed)
Labor Progress Note  HAYLEE MCANANY is a 37 y.o. G1P0000 at [redacted]w[redacted]d presented for IOL for severe preeclampsia (BP and HA/visual changes).  S: Pt feeling exhausted with labor. She has been on pitocin for ~18 hrs, now on 72mu/min. She is not progressing in dilation. Discussed several options for continue labor progression/augmentation including increasing pitocin to 66mu/min, then stopping pit and restarting later vs C-section for arrest of dilation/failed induction.   O:  BP 118/68   Pulse 76   Temp 97.7 F (36.5 C) (Oral)   Resp 20   Ht  (1.651 m)   Wt (!) 152.8 kg   LMP 07/23/2022 (Approximate)   SpO2 99%   BMI 56.05 kg/m  EFM: 130 bpm/ minimal variability/15x15 accels / variable and late decels Toco 2-74min, MVU: 240  CVE: Dilation: 2 Effacement (%): 70 Cervical Position: Posterior Station: -3 Presentation: Vertex Exam by:: Camelia Phenes, MD  A&P: 37 y.o. G1P0000 [redacted]w[redacted]d IOL for severe preeclampsia.  #Labor: Unchanged SVE. Adequate MVUs and on pitocin 73mu/min for a total of 18+ hours of augmentation. Discussed options as mentioned above and pt opted for C-section.  Dahlia Bailiff has agreed to proceed with cesarean section due to Arrest of Dilation. The risks of surgery were discussed with the patient including but were not limited to: bleeding which may require transfusion or reoperation; infection which may require antibiotics; injury to bowel, bladder, ureters or other surrounding organs; injury to the fetus; need for additional procedures including hysterectomy in the event of a life-threatening hemorrhage; formation of adhesions; placental abnormalities wth subsequent pregnancies; incisional problems; thromboembolic phenomenon and other postoperative/anesthesia complications. The patient concurred with the proposed plan, giving informed written consent for the procedure. Patient has been NPO for 12 hours she will remain NPO for procedure. Anesthesia and OR aware. Preoperative  prophylactic antibiotics and SCDs ordered on call to the OR. To OR when ready.   Abx: Gentamicin and Clindamycin given PCN severe allergy. TXA in surgery.  Dahlia Bailiff requested to be full code and agrees to all life saving measures. She also requests that her partner and father of baby, Luna Glasgow be the medical power of attorney if she is not able to make medical decisions for herself.  #Pain: Epidural #FWB: Cat 2- late decels resolved with repositioning #GBS positive. Due to penicillin allergy, on vancomycin. Stopped for surgery. #Severe Pre-E: On Mg. Pre-E labs WNL. BP slightly elevated but not severe. Will continue to monitor BP closely. Labs wnl. Mag to be continued pp for 24hrs. #A1GDM: q4 glucose monitoring. Last CBG 125  Lahoma Crocker Mercado-Ortiz, DO 9:08 PM

## 2023-04-03 NOTE — Discharge Summary (Signed)
Postpartum Discharge Summary     Patient Name: Angelica Crawford DOB: [birth year] MRN: [id number]  Date of admission: 04/01/2023 Delivery date:04/03/2023  Delivering provider: Hermina Staggers  Date of discharge: 04/07/2023  Admitting diagnosis: Preeclampsia, severe [O14.10] Intrauterine pregnancy: [redacted]w[redacted]d     Secondary diagnosis:  Principal Problem:   Status post primary low transverse cesarean section Active Problems:   Encounter for supervision of high risk pregnancy in third trimester, antepartum   Anxiety   Depression   Morbid obesity with BMI of 50.0-59.9, adult   GDM (gestational diabetes mellitus)   Preeclampsia, severe   AKI (acute kidney injury)  Additional problems: None    Discharge diagnosis: Term Pregnancy Delivered, Preeclampsia (severe), and GDM A1                                              Post partum procedures:None Augmentation: AROM, Pitocin, Cytotec, OP Foley, and IP Foley Complications: None  Hospital course: Induction of Labor With Cesarean Section   37 y.o. yo G1P0101 at [redacted]w[redacted]d was admitted to the hospital 04/01/2023 for induction of labor. Patient had a labor course significant for induction and augmentation with eventual arrest of dilation at 2cm despite continued augmentation. The patient went for cesarean section due to Arrest of Dilation. Delivery details are as follows: Membrane Rupture Time/Date:  ,   Delivery Method:C-Section, Low Transverse  Details of operation can be found in separate operative note.  Patient received intrapartum and postpartum magnesium sulfate for eclampsia prophylaxis as per protocol. BP control was obtained with Procardia XL 60 mg qd and she was started on Lasix 20 mg po bid x 5 days, there were no further immediate complications.  Otherwise, patient had a routine postpartum course. She is ambulating, tolerating a regular diet, passing flatus, and urinating well. Patient is discharged home in stable condition on 04/07/2023, and will  follow up in the office for BP check in one week. Of note, had a discussion with patient about increased risk of VTE given her recent pregnancy, surgery and BMI and she desired to continue prophylactic Lovenox for 6 weeks postpartum. This was prescribed for her.       Newborn Data: Birth date:04/03/2023  Birth time:10:18 PM  Gender:Female  Living status:Living  Apgars:5 ,8  Weight:2280 g                                Magnesium Sulfate received: Yes: Seizure prophylaxis BMZ received: No Rhophylac:N/A MMR:N/A T-DaP:Given prenatally Flu: No Transfusion:No  Physical exam  Vitals:   04/06/23 1949 04/06/23 2350 04/07/23 0438 04/07/23 0849  BP: 128/60 (!) 159/77 126/70 125/67  Pulse: 88 83 78 77  Resp: 17 20 18 18   Temp: 98.5 F (36.9 C) 98.4 F (36.9 C) 97.7 F (36.5 C) 97.7 F (36.5 C)  TempSrc: Oral Oral Oral Oral  SpO2: 98% 100% 98% 98%  Weight:      Height:       General: alert, cooperative, and no distress Lochia: appropriate Uterine Fundus: firm, nontender Incision: Healing well with no significant drainage, Dressing is clean, dry, and intact DVT Evaluation: No evidence of DVT seen on physical exam. Negative Homan's sign. No cords or calf tenderness. Labs: Lab Results  Component Value Date   WBC 5.3 04/06/2023   HGB 9.8 (L) 04/06/2023  HCT 28.9 (L) 04/06/2023   MCV 89.2 04/06/2023   PLT 153 04/06/2023      Latest Ref Rng & Units 04/07/2023    5:18 AM  CMP  Glucose 70 - 99 mg/dL 086   BUN 6 - 20 mg/dL 15   Creatinine 5.78 - 1.00 mg/dL 4.69   Sodium 629 - 528 mmol/L 136   Potassium 3.5 - 5.1 mmol/L 4.3   Chloride 98 - 111 mmol/L 101   CO2 22 - 32 mmol/L 24   Calcium 8.9 - 10.3 mg/dL 8.0   Total Protein 6.5 - 8.1 g/dL 4.8   Total Bilirubin 0.3 - 1.2 mg/dL 0.2   Alkaline Phos 38 - 126 U/L 80   AST 15 - 41 U/L 60   ALT 0 - 44 U/L 92    Edinburgh Score:    04/06/2023    1:32 PM  Edinburgh Postnatal Depression Scale Screening Tool  I have been able to  laugh and see the funny side of things. 0  I have looked forward with enjoyment to things. 0  I have blamed myself unnecessarily when things went wrong. 1  I have been anxious or worried for no good reason. 2  I have felt scared or panicky for no good reason. 1  Things have been getting on top of me. 1  I have been so unhappy that I have had difficulty sleeping. 0  I have felt sad or miserable. 1  I have been so unhappy that I have been crying. 0  The thought of harming myself has occurred to me. 0  Edinburgh Postnatal Depression Scale Total 6     After visit meds:  Allergies as of 04/07/2023       Reactions   Penicillins Anaphylaxis   Sulfa Antibiotics Anaphylaxis   Vancomycin Itching, Rash, Other (See Comments)   Throat itching   Gabapentin Other (See Comments)   tremors   Salicylic Acid Other (See Comments)        Medication List     STOP taking these medications    Accu-Chek Guide Me w/Device Kit   Accu-Chek Guide test strip Generic drug: glucose blood   Accu-Chek Softclix Lancets lancets       TAKE these medications    buPROPion 150 MG 24 hr tablet Commonly known as: WELLBUTRIN XL Take 1 tablet (150 mg total) by mouth every morning.   enoxaparin 80 MG/0.8ML injection Commonly known as: LOVENOX Inject 0.8 mLs (80 mg total) into the skin daily. Start taking on: April 08, 2023   EPINEPHrine 0.3 mg/0.3 mL Soaj injection Commonly known as: EPI-PEN Inject 0.3 mg into the muscle as needed for anaphylaxis.   ferrous sulfate 325 (65 FE) MG tablet Take 1 tablet (325 mg total) by mouth every other day.   FLUoxetine 40 MG capsule Commonly known as: PROZAC Take 80 mg by mouth daily.   furosemide 20 MG tablet Commonly known as: LASIX Take 1 tablet (20 mg total) by mouth daily for 3 days.   ibuprofen 600 MG tablet Commonly known as: ADVIL Take 1 tablet (600 mg total) by mouth every 6 (six) hours as needed for mild pain or moderate pain.   NIFEdipine 60  MG 24 hr tablet Commonly known as: ADALAT CC Take 1 tablet (60 mg total) by mouth daily.   oxyCODONE 5 MG immediate release tablet Commonly known as: Oxy IR/ROXICODONE Take 1 tablet (5 mg total) by mouth every 4 (four) hours as needed for severe pain  or breakthrough pain.   PRENATAL VITAMIN PO Take by mouth.   senna-docusate 8.6-50 MG tablet Commonly known as: Senokot-S Take 2 tablets by mouth 2 (two) times daily as needed for mild constipation or moderate constipation.               Discharge Care Instructions  (From admission, onward)           Start     Ordered   04/07/23 0000  Discharge wound care:       Comments: As per discharge handout and nursing instructions   04/07/23 1011             Discharge home in stable condition Infant Feeding: Breast Infant Disposition:home with mother Discharge instruction: per After Visit Summary and Postpartum booklet. Activity: Advance as tolerated. Pelvic rest for 6 weeks.  Diet: routine diet Future Appointments: 1 week at [facility] (message sent for BP and incision check) Future Appointments  Date Time Provider Department Center  05/08/2023 11:10 AM Jacklyn Shell, CNM [facility]    04/07/2023 Jaynie Collins, MD

## 2023-04-03 NOTE — Transfer of Care (Signed)
Immediate Anesthesia Transfer of Care Note  Patient: Angelica Crawford  Procedure(s) Performed: CESAREAN SECTION  Patient Location: PACU  Anesthesia Type:Epidural  Level of Consciousness: awake, alert , and oriented  Airway & Oxygen Therapy: Patient Spontanous Breathing  Post-op Assessment: Report given to RN and Post -op Vital signs reviewed and stable  Post vital signs: Reviewed and stable  Last Vitals:  Vitals Value Taken Time  BP 110/66 04/03/23 2330  Temp 36.4 C 04/03/23 2326  Pulse 70 04/03/23 2333  Resp 19 04/03/23 2333  SpO2 99 % 04/03/23 2333  Vitals shown include unvalidated device data.  Last Pain:  Vitals:   04/03/23 1932  TempSrc: Oral  PainSc:       Patients Stated Pain Goal: 0 (04/02/23 2000)  Complications: No notable events documented.

## 2023-04-03 NOTE — Op Note (Signed)
Dahlia Bailiff PROCEDURE DATE: 04/03/2023  PREOPERATIVE DIAGNOSES: Intrauterine pregnancy at [redacted]w[redacted]d weeks gestation; failure to progress: arrest of dilation  POSTOPERATIVE DIAGNOSES: The same, viable infant delivered  PROCEDURE: Primary Low Transverse Cesarean Section  SURGEON:  Dr. Nettie Elm  ASSISTANT:  [name], DO An experienced assistant was required given the standard of surgical care given the complexity of the case.  This assistant was needed for exposure, dissection, suctioning, retraction, instrument exchange, assisting with delivery with administration of fundal pressure, and for overall help during the procedure.  ANESTHESIOLOGY TEAM: Anesthesiologist: Linton Rump, MD; Beryle Lathe, MD; Kaylyn Layer, MD  INDICATIONS: Angelica Crawford is a 37 y.o. G1P0101 at [redacted]w[redacted]d here for cesarean section secondary to the indications listed under preoperative diagnoses; please see preoperative note for further details.  The risks of surgery were discussed with the patient including but were not limited to: bleeding which may require transfusion or reoperation; infection which may require antibiotics; injury to bowel, bladder, ureters or other surrounding organs; injury to the fetus; need for additional procedures including hysterectomy in the event of a life-threatening hemorrhage; formation of adhesions; placental abnormalities wth subsequent pregnancies; incisional problems; thromboembolic phenomenon and other postoperative/anesthesia complications.  The patient concurred with the proposed plan, giving informed written consent for the procedure.    FINDINGS:  Viable female infant in cephalic presentation.  Apgars 5 and 8.  Amniotic fluid: clear.  Intact placenta, three vessel cord.  Normal uterus, fallopian tubes and ovaries bilaterally.  ANESTHESIA: epidural INTRAVENOUS FLUIDS: 1000 ml   ESTIMATED BLOOD LOSS: 481 ml URINE OUTPUT:  100 ml SPECIMENS:  Placenta sent to L&D . COMPLICATIONS: None immediate  PROCEDURE IN DETAIL:  The patient preoperatively received intravenous antibiotics and had sequential compression devices applied to her lower extremities.  She was then taken to the operating room where the epidural was dosed up to a surgical level and found to be adequate. She was then placed in a dorsal supine position with a leftward tilt, and prepped and draped in a sterile manner.  A foley catheter was  was already in place.  After an adequate timeout was performed, a Pfannenstiel skin incision was made with scalpel and carried through to the underlying layer of fascia. The fascia was incised in the midline, and this incision was extended bluntly. The rectus muscles were separated in the midline and the peritoneum was entered bluntly.   The Alexis self-retaining retractor was introduced into the abdominal cavity.  Attention was turned to the lower uterine segment where a low transverse hysterotomy was made with a scalpel and extended bluntly in caudad and cephalad directions.  The infant was successfully delivered, the cord was clamped and cut after 30 seconds due to lack of spontaneous cry, tone and grimace, and the infant was handed over to the awaiting neonatology team. Uterine massage was then administered, and the placenta delivered intact with a three-vessel cord. The uterus was then cleared of clots and debris.  The hysterotomy was closed with 3-0-Monocryl in a running fashion.  A second imbricating layer was placed with 3-0- Monocryl in running fashion. Figure-of-eight 0 monocryl serosal stitches were placed to help with hemostasis.    The pelvis was cleared of all clot and debris. Hemostasis was confirmed on all surfaces.  The uterus was once again inspected and found to be hemostatic. The retractor was removed.  The peritoneum was closed with a 2-0 Vicryl running stitch. The fascia was then closed using 0 Vicryl  in a running fashion.  The  subcutaneous layer was irrigated, any areas of bleeding were cauterized with the bovie,  was reapproximated with 2-0 plain gut in a running fashion, was found to be hemostatic.. . The skin was closed with a 4-0 Vicryl subcuticular stitch x2. The patient tolerated the procedure well. Sponge, instrument and needle counts were correct x 3.  She was taken to the recovery room in stable condition.   [name], DO FMOB Fellow, Faculty practice [facility], Center for [facility] 04/03/23  11:10 PM

## 2023-04-03 NOTE — Progress Notes (Signed)
Labor Progress Note  Angelica Crawford is a 37 y.o. G1P0000 at [redacted]w[redacted]d presented for IOL for severe preeclampsia (BP and HA/visual changes).  S: No acute concerns  O:  BP (!) 124/54   Pulse 78   Temp 98.1 F (36.7 C) (Axillary)   Resp 15   Ht  (1.651 m)   Wt (!) 152.8 kg   LMP 07/23/2022 (Approximate)   SpO2 99%   BMI 56.05 kg/m  EFM: 130 bpm/Moderate variability/15x15 accels / No decels  CVE: Dilation: 2 Effacement (%): 70 Cervical Position: Posterior Station: -3 Presentation: Vertex Exam by:: Dr. Salvadore Dom  A&P: 37 y.o. G1P0000 [redacted]w[redacted]d IOL for severe preeclampsia.  #Labor: Unchanged. Pitocin on 16. MVUs are 200. Continue to titrate up on pitocin. Consider pitocin break at some point if unchanged with max of pitocin.  #Pain: Epidural #FWB: Cat 1 #GBS positive. Due to penicillin allergy, on vancomycin  #Severe Pre-E: On Mg. Pre-E labs WNL. BP slightly elevated but not severe. Will continue to monitor BP closely. Labs pending.   #A1GDM: q4 glucose monitoring. Last CBG 94  [name], DO 4:21 PM

## 2023-04-03 NOTE — Progress Notes (Signed)
Labor Progress Note  Angelica Crawford is a 37 y.o. G1P0000 at [redacted]w[redacted]d presented for IOL for severe preeclampsia (BP and HA/visual changes).  S: Pt has epidural, cook out. Per nursing, did not SROM, still intact  O:  BP (!) 99/53   Pulse 94   Temp 97.6 F (36.4 C) (Oral)   Resp 17   Ht  (1.651 m)   Wt (!) 152.8 kg   LMP 07/23/2022 (Approximate)   SpO2 99%   BMI 56.05 kg/m  EFM: 120 bpm/Moderate variability/15x15 accels / Variable decels  CVE: Dilation: 2 Effacement (%): 70 Cervical Position: Posterior Station: -3 Presentation: Vertex Exam by:: Gillen, RN  A&P: 37 y.o. G1P0000 [redacted]w[redacted]d IOL for severe preeclampsia.  #Labor: s/p Cook. Increase pit 2x2. Plan AROM soon. #Pain: epidural #FWB: Cat 1 #GBS positive. Due to penicillin allergy, on vancomycin  #Severe Pre-E: On Mg. Pre-E labs WNL. BP slightly elevated but not severe. Will continue to monitor BP closely.  #A1GDM: q4 glucose monitoring. Last CBG 95  Hugh Garrow Q Mercado-Ortiz, DO 6:40 AM

## 2023-04-03 NOTE — Progress Notes (Signed)
Pharmacy Antibiotic Note  Angelica Crawford is a 37 y.o. female admitted on 04/01/2023 for IOL labor due to preeclampia.  Pharmacy has been consulted for Vancomycin dosing for GBS prophylaxis in pt with Penicillin allergy.  Plan Currently receiving Vanc  IV q8h. Trough drawn today 1540 prior to 1600 dose= 60mcg/ml. Will hold doses and check random level at 0300.   Height:  (165.1 cm) Weight: (!) 152.8 kg (336 lb 12.8 oz) IBW/kg (Calculated) : 57  Temp (24hrs), Avg:97.9 F (36.6 C), Min:97.5 F (36.4 C), Max:98.5 F (36.9 C)  Recent Labs  Lab 04/01/23 1739 04/02/23 1640 04/02/23 2346 04/03/23 0745 04/03/23 1540  WBC 9.5 8.6 8.5 8.6  --   CREATININE 0.85 0.83  --  1.00 0.96  VANCOTROUGH  --   --   --   --  55*    Estimated Creatinine Clearance: 119.5 mL/min (by C-G formula based on SCr of 0.96 mg/dL).    Allergies  Allergen Reactions   Penicillins Anaphylaxis   Sulfa Antibiotics Anaphylaxis   Vancomycin Itching, Rash and Other (See Comments)    Throat itching   Gabapentin Other (See Comments)    tremors   Salicylic Acid Other (See Comments)    Thank you for allowing pharmacy to be a part of this patient's care.  Claybon Jabs 04/03/2023 5:51 PM

## 2023-04-03 NOTE — Anesthesia Procedure Notes (Signed)
Epidural Patient location during procedure: OB Start time: 04/03/2023 12:48 AM End time: 04/03/2023 12:52 AM  Staffing Anesthesiologist: Beryle Lathe, MD Performed: anesthesiologist   Preanesthetic Checklist Completed: patient identified, IV checked, risks and benefits discussed, monitors and equipment checked, pre-op evaluation and timeout performed  Epidural Patient position: sitting Prep: DuraPrep Patient monitoring: continuous pulse ox and blood pressure Approach: midline Location: L3-L4 Injection technique: LOR saline  Needle:  Needle type: Tuohy  Needle gauge: 17 G Needle length: 9 cm Needle insertion depth: 8 cm Catheter size: 19 Gauge Catheter at skin depth: 13 cm Test dose: negative and Other (1% lidocaine)  Assessment Events: blood not aspirated and no cerebrospinal fluid  Additional Notes Patient identified. Risks including, but not limited to, bleeding, infection, nerve damage, paralysis, inadequate analgesia, blood pressure changes, nausea, vomiting, allergic reaction, postpartum back pain, itching, and headache were discussed. Patient expressed understanding and wished to proceed. Sterile prep and drape, including hand hygiene, mask, and sterile gloves were used. The patient was positioned and the spine was prepped. The skin was anesthetized with lidocaine. No paraesthesia or other complication noted. The patient did not experience any signs of intravascular injection such as tinnitus or metallic taste in mouth, nor signs of intrathecal spread such as rapid motor block. Please see nursing notes for vital signs. The patient tolerated the procedure well.   Leslye Peer, MDReason for block:procedure for pain

## 2023-04-03 NOTE — Anesthesia Preprocedure Evaluation (Addendum)
Anesthesia Evaluation  Patient identified by MRN, date of birth, ID band Patient awake    Reviewed: Allergy & Precautions, NPO status , Patient's Chart, lab work & pertinent test results  History of Anesthesia Complications Negative for: history of anesthetic complications  Airway Mallampati: II  TM Distance: >3 FB Neck ROM: Full    Dental   Pulmonary sleep apnea    Pulmonary exam normal        Cardiovascular hypertension, Normal cardiovascular exam     Neuro/Psych  Headaches, Seizures -,  PSYCHIATRIC DISORDERS Anxiety Depression       GI/Hepatic negative GI ROS, Neg liver ROS,,,  Endo/Other  diabetes, Gestational  Morbid obesity Na 132 Ca 7 Mg 6.3   Renal/GU negative Renal ROS     Musculoskeletal negative musculoskeletal ROS (+)    Abdominal  (+) + obese  Peds  Hematology  (+) Blood dyscrasia, anemia  Plt 205k    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  Reproductive/Obstetrics (+) Pregnancy  Severe Pre-E                              Anesthesia Physical Anesthesia Plan  ASA: 3 and emergent  Anesthesia Plan: Epidural   Post-op Pain Management:    Induction:   PONV Risk Score and Plan: 3 and Treatment may vary due to age or medical condition, Dexamethasone and Ondansetron  Airway Management Planned: Natural Airway  Additional Equipment:   Intra-op Plan:   Post-operative Plan:   Informed Consent: I have reviewed the patients History and Physical, chart, labs and discussed the procedure including the risks, benefits and alternatives for the proposed anesthesia with the patient or authorized representative who has indicated his/her understanding and acceptance.       Plan Discussed with: Anesthesiologist and CRNA  Anesthesia Plan Comments: (Labs reviewed. Platelets acceptable, patient not taking any blood thinning medications. Per RN, FHR tracing reported to be stable  enough for sitting procedure. Risks and benefits discussed with patient, including PDPH, backache, epidural hematoma, failed epidural, blood pressure changes, allergic reaction, and nerve injury. Patient expressed understanding and wished to proceed.  Epidural to be used for unscheduled C/S (arrest of dilation). Stephannie Peters, MD)        Anesthesia Quick Evaluation

## 2023-04-03 NOTE — Progress Notes (Signed)
Date and time results received: 04/03/23 1532  (use smartphrase ".now" to insert current time)  Test: calcium, magnesium, and vanc  Critical Value: 6.2 calcium, 6.8 magnesium, and 52 vanc  Name of Provider Notified: dr. Cherlynn Polo lott and dr. bass  Orders Received? Or Actions Taken?: Orders Received - See Orders for details and Actions Taken: pharmacy notified for vanc, and meds ordered for calcium

## 2023-04-04 LAB — CREATININE, SERUM
Creatinine, Ser: 1.18 mg/dL — ABNORMAL HIGH (ref 0.44–1.00)
GFR, Estimated: >60 mL/min (ref >60)

## 2023-04-04 LAB — VANCOMYCIN, RANDOM: Vancomycin Rm: 11 ug/mL

## 2023-04-04 LAB — CBC
HCT: 33 % — ABNORMAL LOW (ref 36.0–46.0)
HCT: 32 % — ABNORMAL LOW (ref 36.0–46.0)
Hemoglobin: 11.1 g/dL — ABNORMAL LOW (ref 12.0–15.0)
Hemoglobin: 11 g/dL — ABNORMAL LOW (ref 12.0–15.0)
MCH: 29.8 pg (ref 26.0–34.0)
MCH: 30.3 pg (ref 26.0–34.0)
MCHC: 33.6 g/dL (ref 30.0–36.0)
MCHC: 34.4 g/dL (ref 30.0–36.0)
MCV: 88.7 fL (ref 80.0–100.0)
MCV: 88.2 fL (ref 80.0–100.0)
Platelets: 190 10*3/uL (ref 150–400)
Platelets: 192 10*3/uL (ref 150–400)
RBC: 3.72 MIL/uL — ABNORMAL LOW (ref 3.87–5.11)
RBC: 3.63 MIL/uL — ABNORMAL LOW (ref 3.87–5.11)
RDW: 13.8 % (ref 11.5–15.5)
RDW: 13.9 % (ref 11.5–15.5)
WBC: 12.5 10*3/uL — ABNORMAL HIGH (ref 4.0–10.5)
WBC: 13.4 10*3/uL — ABNORMAL HIGH (ref 4.0–10.5)
nRBC: 0 % (ref 0.0–0.2)
nRBC: 0 % (ref 0.0–0.2)

## 2023-04-04 MED ORDER — DIBUCAINE (PERIANAL) 1 % EX OINT: 1.0000 | TOPICAL_OINTMENT | CUTANEOUS | Status: DC | PRN

## 2023-04-04 MED ORDER — SIMETHICONE 80 MG PO CHEW
80.0000 mg | CHEWABLE_TABLET | Freq: Three times a day (TID) | ORAL | Status: DC
  Administered 2023-04-04 – 2023-04-07 (×9): 80 mg via ORAL
  Filled 2023-04-04 (×10): qty 1

## 2023-04-04 MED ORDER — KETOROLAC TROMETHAMINE 30 MG/ML IJ SOLN
30.0000 mg | Freq: Four times a day (QID) | INTRAMUSCULAR | Status: AC
  Administered 2023-04-04 – 2023-04-05 (×4): 30 mg via INTRAVENOUS
  Filled 2023-04-04 (×4): qty 1

## 2023-04-04 MED ORDER — HYDRALAZINE HCL 20 MG/ML IJ SOLN: 10.0000 mg | INTRAMUSCULAR | Status: DC | PRN

## 2023-04-04 MED ORDER — LACTATED RINGERS IV SOLN: INTRAVENOUS | Status: DC

## 2023-04-04 MED ORDER — MENTHOL 3 MG MT LOZG: 1.0000 | LOZENGE | OROMUCOSAL | Status: DC | PRN

## 2023-04-04 MED ORDER — SENNOSIDES-DOCUSATE SODIUM 8.6-50 MG PO TABS
2.0000 | ORAL_TABLET | Freq: Every day | ORAL | Status: DC
  Administered 2023-04-04 – 2023-04-07 (×4): 2 via ORAL
  Filled 2023-04-04 (×4): qty 2

## 2023-04-04 MED ORDER — ACETAMINOPHEN 500 MG PO TABS: 1000.0000 mg | ORAL_TABLET | Freq: Four times a day (QID) | ORAL | Status: DC

## 2023-04-04 MED ORDER — ZOLPIDEM TARTRATE 5 MG PO TABS: 5.0000 mg | ORAL_TABLET | Freq: Every evening | ORAL | Status: DC | PRN

## 2023-04-04 MED ORDER — FUROSEMIDE 10 MG/ML IJ SOLN
20.0000 mg | Freq: Every day | INTRAMUSCULAR | Status: DC
  Administered 2023-04-04: 20 mg via INTRAVENOUS
  Filled 2023-04-04 (×2): qty 2

## 2023-04-04 MED ORDER — LABETALOL HCL 5 MG/ML IV SOLN: 20.0000 mg | INTRAVENOUS | Status: DC | PRN

## 2023-04-04 MED ORDER — LABETALOL HCL 5 MG/ML IV SOLN: 40.0000 mg | INTRAVENOUS | Status: DC | PRN

## 2023-04-04 MED ORDER — OXYTOCIN-SODIUM CHLORIDE 30-0.9 UT/500ML-% IV SOLN: 2.5000 [IU]/h | INTRAVENOUS | Status: AC

## 2023-04-04 MED ORDER — PRENATAL MULTIVITAMIN CH
1.0000 | ORAL_TABLET | Freq: Every day | ORAL | Status: DC
  Administered 2023-04-04 – 2023-04-06 (×3): 1 via ORAL
  Filled 2023-04-04 (×3): qty 1

## 2023-04-04 MED ORDER — GABAPENTIN 100 MG PO CAPS: 200.0000 mg | ORAL_CAPSULE | Freq: Every day | ORAL | Status: DC

## 2023-04-04 MED ORDER — WITCH HAZEL-GLYCERIN EX PADS: 1.0000 | MEDICATED_PAD | CUTANEOUS | Status: DC | PRN

## 2023-04-04 MED ORDER — SIMETHICONE 80 MG PO CHEW: 80.0000 mg | CHEWABLE_TABLET | ORAL | Status: DC | PRN

## 2023-04-04 MED ORDER — LABETALOL HCL 5 MG/ML IV SOLN: 80.0000 mg | INTRAVENOUS | Status: DC | PRN

## 2023-04-04 MED ORDER — COCONUT OIL OIL
1.0000 | TOPICAL_OIL | Status: DC | PRN
  Administered 2023-04-04: 1 via TOPICAL

## 2023-04-04 MED ORDER — DIPHENHYDRAMINE HCL 25 MG PO CAPS: 25.0000 mg | ORAL_CAPSULE | Freq: Four times a day (QID) | ORAL | Status: DC | PRN

## 2023-04-04 MED ORDER — ENOXAPARIN SODIUM 80 MG/0.8ML IJ SOSY
80.0000 mg | PREFILLED_SYRINGE | INTRAMUSCULAR | Status: DC
  Administered 2023-04-04 – 2023-04-07 (×4): 80 mg via SUBCUTANEOUS
  Filled 2023-04-04 (×4): qty 0.8

## 2023-04-04 MED ORDER — MAGNESIUM SULFATE 40 GM/1000ML IV SOLN
2.0000 g/h | INTRAVENOUS | Status: DC
  Administered 2023-04-04 (×2): 2 g/h via INTRAVENOUS
  Filled 2023-04-04: qty 1000

## 2023-04-04 MED ORDER — MAGNESIUM HYDROXIDE 400 MG/5ML PO SUSP: 30.0000 mL | ORAL | Status: DC | PRN

## 2023-04-04 MED ORDER — IBUPROFEN 600 MG PO TABS
600.0000 mg | ORAL_TABLET | Freq: Four times a day (QID) | ORAL | Status: DC
  Administered 2023-04-05: 600 mg via ORAL
  Filled 2023-04-04: qty 1

## 2023-04-04 MED ORDER — OXYCODONE HCL 5 MG PO TABS
5.0000 mg | ORAL_TABLET | ORAL | Status: DC | PRN
  Administered 2023-04-04: 10 mg via ORAL
  Administered 2023-04-04 (×2): 5 mg via ORAL
  Administered 2023-04-05 – 2023-04-07 (×13): 10 mg via ORAL
  Filled 2023-04-04 (×6): qty 2
  Filled 2023-04-04: qty 1
  Filled 2023-04-04 (×6): qty 2
  Filled 2023-04-04: qty 1
  Filled 2023-04-04 (×2): qty 2

## 2023-04-04 NOTE — Progress Notes (Signed)
Subjective: Postpartum Day 1: Cesarean Delivery d/t arrest of descent and dilatation and SPEC Patient has no complaints this morning. Denies HA or visual changes Pain controlled    Objective: Vital signs in last 24 hours: Temp:  [97.5 F (36.4 C)-98.5 F (36.9 C)] 97.6 F (36.4 C) (04/19 0523) Pulse Rate:  [58-198] 79 (04/19 0523) Resp:  [12-20] 16 (04/19 0615) BP: (90-159)/(52-111) 121/63 (04/19 0523) SpO2:  [97 %-100 %] 98 % (04/19 0523)  Physical Exam:  General: alert Lochia: appropriate Uterine Fundus: firm Incision: healing well DVT Evaluation: No evidence of DVT seen on physical exam.  Recent Labs    04/04/23 0102 04/04/23 0508  HGB 11.1* 11.0*  HCT 33.0* 32.0*    Assessment/Plan: Status post Cesarean section. Doing well postoperatively.  Magnesium x 24 hrs. Monitor BP. Continue with progressive care  Hermina Staggers, MD 04/04/2023, 8:22 AM

## 2023-04-04 NOTE — Lactation Note (Signed)
This note was copied from a baby's chart. Lactation Consultation Note  Patient Name: Angelica Crawford [id number] Date: 04/04/2023 Age:39 hours Reason for consult: Initial assessment;Infant < 6lbs;Late-preterm 34-36.6wks;Primapara;1st time breastfeeding  Visited with family of 41 hours old LPI female; Ms. Keitt is a P1 and reported (+) breast changes during the pregnancy. She's been supplementing baby with Similac 22 calorie formula every 3 hours, but didn't initiated pumping until [facility] consultation. Explained the importance of early onset of pumping for the onset of lactogenesis II and to protect her supply. Assisted with hand expression (no colostrum noted yet, patient has remarkable pitting edema) and the initiation of pumping, she was still pumping when exiting the room. Her plan is to eventually take baby to breast, asked her to call for assistance when needed. Reviewed normal LPI behavior, feeding cues, size of baby's stomach and supplementation guidelines for LPIs.   Maternal Data Has patient been taught Hand Expression?: Yes Does the patient have breastfeeding experience prior to this delivery?: No  Feeding Mother's Current Feeding Choice: Breast Milk and Formula Nipple Type: Extra Slow Flow  Lactation Tools Discussed/Used Tools: Pump;Flanges Flange Size: 24 Breast pump type: Double-Electric Breast Pump Pump Education: Setup, frequency, and cleaning;Milk Storage Reason for Pumping: LPI < 6 lbs Pumping frequency: Patient initiated pumping during LC consultation at 18 hours post-partum Pumped volume: 0 mL  Interventions Interventions: Breast feeding basics reviewed;Breast massage;Hand express;DEBP;Education;LC Services brochure;LPT handout/interventions  Plan of care Encouraged to put baby to breast at least 8 times/24 hours or sooner if feeding cues are present Pumping every 3 hours, ideally 8 pumping sessions/24 hours or whenever baby is getting a bottle was also  encouraged Breast massage and hand expression were recommended prior pumping/latching She'll continue supplementing baby with Similac 22 calorie formula according to LPI guidelines  FOB and visitors present. All questions and concerns answered, family to contact [facility] In[facility] services PRN.  Discharge Pump: Advised to call insurance company  Consult Status Consult Status: Follow-up Date: 04/05/23 Follow-up type: In-patient   Danyelle Brookover Venetia Constable 04/04/2023, 5:26 PM

## 2023-04-04 NOTE — Progress Notes (Signed)
MOB was referred for history of depression/anxiety. * Referral screened out by Clinical Social Worker because none of the following criteria appear to apply: ~ History of anxiety/depression during this pregnancy, or of post-partum depression following prior delivery. ~ Diagnosis of anxiety and/or depression within last 3 years OR * MOB's symptoms currently being treated with medication and/or therapy. MOB has an active Rx for Wellbutrin and Prozac.  Please contact the Clinical Social Worker if needs arise, by [facility] request, or if MOB scores greater than 9/yes to question 10 on Edinburgh Postpartum Depression Screen.   Blaine Hamper, MSW, LCSW Clinical Social Work [phone]

## 2023-04-04 NOTE — Anesthesia Postprocedure Evaluation (Signed)
Anesthesia Post Note  Patient: Angelica Crawford  Procedure(s) Performed: CESAREAN SECTION     Patient location during evaluation: PACU Anesthesia Type: Epidural Level of consciousness: awake and alert Pain management: pain level controlled Vital Signs Assessment: post-procedure vital signs reviewed and stable Respiratory status: spontaneous breathing, nonlabored ventilation and respiratory function stable Cardiovascular status: blood pressure returned to baseline Postop Assessment: epidural receding, no apparent nausea or vomiting, no headache and no backache Anesthetic complications: no   No notable events documented.  Last Vitals:  Vitals:   04/04/23 0100 04/04/23 0203  BP: 105/65 (!) 119/52  Pulse: 74 80  Resp: 14 16  Temp: (!) 36.4 C 36.7 C  SpO2: 100% 97%    Last Pain:  Vitals:   04/04/23 0203  TempSrc: Oral  PainSc:    Pain Goal: Patients Stated Pain Goal: 0 (04/02/23 2000)                 Shanda Howells

## 2023-04-05 DIAGNOSIS — N179 Acute kidney failure, unspecified: Secondary | ICD-10-CM | POA: Diagnosis not present

## 2023-04-05 LAB — COMPREHENSIVE METABOLIC PANEL
ALT: 34 U/L (ref 0–44)
AST: 36 U/L (ref 15–41)
Albumin: <1.5 g/dL — ABNORMAL LOW (ref 3.5–5.0)
Alkaline Phosphatase: 77 U/L (ref 38–126)
Anion gap: 7 (ref 5–15)
BUN: 21 mg/dL — ABNORMAL HIGH (ref 6–20)
CO2: 20 mmol/L — ABNORMAL LOW (ref 22–32)
Calcium: 5.9 mg/dL — CL (ref 8.9–10.3)
Chloride: 106 mmol/L (ref 98–111)
Creatinine, Ser: 1.1 mg/dL — ABNORMAL HIGH (ref 0.44–1.00)
GFR, Estimated: >60 mL/min (ref >60)
Glucose, Bld: 109 mg/dL — ABNORMAL HIGH (ref 70–99)
Potassium: 3.9 mmol/L (ref 3.5–5.1)
Sodium: 133 mmol/L — ABNORMAL LOW (ref 135–145)
Total Bilirubin: 0.3 mg/dL (ref 0.3–1.2)
Total Protein: 4.4 g/dL — ABNORMAL LOW (ref 6.5–8.1)

## 2023-04-05 LAB — CALCIUM: Calcium: 6.5 mg/dL — ABNORMAL LOW (ref 8.9–10.3)

## 2023-04-05 LAB — CBC
HCT: 26.9 % — ABNORMAL LOW (ref 36.0–46.0)
Hemoglobin: 8.7 g/dL — ABNORMAL LOW (ref 12.0–15.0)
MCH: 29.9 pg (ref 26.0–34.0)
MCHC: 32.3 g/dL (ref 30.0–36.0)
MCV: 92.4 fL (ref 80.0–100.0)
Platelets: 147 10*3/uL — ABNORMAL LOW (ref 150–400)
RBC: 2.91 MIL/uL — ABNORMAL LOW (ref 3.87–5.11)
RDW: 14.4 % (ref 11.5–15.5)
WBC: 3.5 10*3/uL — ABNORMAL LOW (ref 4.0–10.5)
nRBC: 0 % (ref 0.0–0.2)

## 2023-04-05 LAB — MAGNESIUM: Magnesium: 3 mg/dL — ABNORMAL HIGH (ref 1.7–2.4)

## 2023-04-05 MED ORDER — BUPROPION HCL ER (XL) 150 MG PO TB24
150.0000 mg | ORAL_TABLET | Freq: Every day | ORAL | Status: DC
  Administered 2023-04-05 – 2023-04-07 (×3): 150 mg via ORAL
  Filled 2023-04-05 (×3): qty 1

## 2023-04-05 MED ORDER — ONDANSETRON HCL 4 MG PO TABS
4.0000 mg | ORAL_TABLET | Freq: Three times a day (TID) | ORAL | Status: DC | PRN
  Administered 2023-04-05 – 2023-04-07 (×3): 4 mg via ORAL
  Filled 2023-04-05 (×3): qty 1

## 2023-04-05 MED ORDER — FERROUS SULFATE 325 (65 FE) MG PO TABS
325.0000 mg | ORAL_TABLET | ORAL | Status: DC
  Administered 2023-04-05 – 2023-04-07 (×2): 325 mg via ORAL
  Filled 2023-04-05 (×2): qty 1

## 2023-04-05 MED ORDER — FLUOXETINE HCL 20 MG PO CAPS
80.0000 mg | ORAL_CAPSULE | Freq: Every day | ORAL | Status: DC
  Administered 2023-04-05 – 2023-04-07 (×3): 80 mg via ORAL
  Filled 2023-04-05 (×3): qty 4

## 2023-04-05 MED ORDER — CALCIUM GLUCONATE-NACL 1-0.675 GM/50ML-% IV SOLN
1.0000 g | Freq: Once | INTRAVENOUS | Status: AC
  Administered 2023-04-05: 1000 mg via INTRAVENOUS
  Filled 2023-04-05: qty 50

## 2023-04-05 MED ORDER — NIFEDIPINE ER OSMOTIC RELEASE 30 MG PO TB24
30.0000 mg | ORAL_TABLET | Freq: Every day | ORAL | Status: DC
  Administered 2023-04-05 – 2023-04-06 (×2): 30 mg via ORAL
  Filled 2023-04-05 (×2): qty 1

## 2023-04-05 MED ORDER — IBUPROFEN 600 MG PO TABS
600.0000 mg | ORAL_TABLET | Freq: Four times a day (QID) | ORAL | Status: DC | PRN
  Administered 2023-04-05 – 2023-04-07 (×5): 600 mg via ORAL
  Filled 2023-04-05 (×5): qty 1

## 2023-04-05 MED ORDER — ACETAMINOPHEN 500 MG PO TABS
1000.0000 mg | ORAL_TABLET | Freq: Three times a day (TID) | ORAL | Status: DC | PRN
  Administered 2023-04-05: 1000 mg via ORAL
  Filled 2023-04-05: qty 2

## 2023-04-05 MED ORDER — FUROSEMIDE 20 MG PO TABS
20.0000 mg | ORAL_TABLET | Freq: Every day | ORAL | Status: DC
  Administered 2023-04-05 – 2023-04-07 (×3): 20 mg via ORAL
  Filled 2023-04-05 (×3): qty 1

## 2023-04-05 NOTE — Progress Notes (Signed)
OB Note Patient with low grade temp of 38.1 at 2000. Repeat 99.7 and patient has had a few low grade temps during the day shift. Patient currently pumping and getting a decent amount out, pain is controlled on PO meds. Breast appear normal and she doesn't feel they are tight/engorged. No pulmonary or lower urinary tract s/s.  She had an epidural for her c-section and she is on ppx lovenox already.   NAD//no MRGs, RRR//CTAB//abdomen obese, nttp, nd.  Continue to follow. Urine culture ordered and repeat labs for AM  Cornelia Copa MD Attending Center for [facility] (Faculty Practice) 04/05/2023 Time: 2043

## 2023-04-05 NOTE — Lactation Note (Signed)
This note was copied from a baby's chart. Lactation Consultation Note  Patient Name: Girl Aevah Stansbery ZOXWR'U Date: 04/05/2023 Age:39 hours Reason for consult: Follow-up assessment;Early term 37-38.6wks;Infant < 6lbs (See Birth Parent's MR- C/S delivery, GHTN, GDMA1,  infant with weight loss -3.51%)  P1, LPTI, per Birth Parent she has used DEBP three times and recently pumped 1 hour ago and expressed 5 mls of colostrum that she will offer infant at the next feeding. LC unable to assist with latch, infant was given 22 mls of 22 kcal formula by Support Person at 2200 pm. Birth Parent knows that her EBM is safe at room temperature for 4 hours whereas formula must be used within 1 hour.   LC reviewed LPTI feeding policy:  Birth Parent will ask for latch assistance if needed. Continue to feed infant every 3 hours (8 times) within 24 hours, and limit total feedings chest and formula feeding to 30 minutes or less. Birth Parent knows if infant doesn't latch after 5 minutes to stop and supplement infant with any EBM first and then formula. Birth Parent will continue to use DEBP every 3 hours for 15 minutes on initial setting and give infant back anyEBM first before formula. Birth Parent knows to increase infant's volume Day 3 ( 20- 24 mls) or more per feeding. Maternal Data    Feeding Mother's Current Feeding Choice: Breast Milk and Formula  LATCH Score  Infant was recently formula feed unable to assist with latch at this time.                  Lactation Tools Discussed/Used Pumping frequency: Birth Parent will continue to use DEBP every 3 hours for 15 minutes on inital setting. Pumped volume: 5 mL  Interventions Interventions: DEBP;LPT handout/interventions;Pace feeding  Discharge Pump:  [facility] sent referral for Ephriam Knuckles)  Consult Status Consult Status: Follow-up Date: 04/06/23 Follow-up type: In-patient    Frederico Hamman 04/05/2023, 9:57 PM

## 2023-04-05 NOTE — Progress Notes (Signed)
Latest Reference Range & Units 04/05/23 08:15  Calcium 8.9 - 10.3 mg/dL 5.9 (LL)  (LL): Data is critically low  Dr. Donavan Foil notified of critical calcium level. No new orders given. Carmelina Dane, RN

## 2023-04-05 NOTE — Progress Notes (Signed)
Daily Antepartum Note  Admission Date: 04/01/2023 Current Date: 04/05/2023 7:31 AM  Angelica Crawford is a 37 y.o. 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 +flatuls, taking some po, pain controlled and just got up with nursing help.   Objective:    Current Vital Signs 24h Vital Sign Ranges  T 97.7 F (36.5 C) Temp  Avg: 98.1 F (36.7 C)  Min: 97.7 F (36.5 C)  Max: 98.5 F (36.9 C)  BP (!) 124/51 BP  Min: 107/48  Max: 148/79  HR 82 Pulse  Avg: 79.7  Min: 74  Max: 91  RR 16 Resp  Avg: 18.4  Min: 16  Max: 20  SaO2 100 % Room Air SpO2  Avg: 97.8 %  Min: 97 %  Max: 100 %       24 Hour I/O Current Shift I/O  Time Ins Outs 04/19 0701 - 04/20 0700 In: 5422.9 [P.O.:4280; I.V.:1142.9] Out: 4050 [Urine:4050] No intake/output data recorded.   Patient Vitals for the past 24 hrs:  BP Temp Temp src Pulse Resp SpO2  04/05/23 0416 (!) 124/51 97.7 F (36.5 C) Oral 82 16 100 %  04/04/23 2322 (!) 113/52 98.2 F (36.8 C) Oral 82 17 97 %  04/04/23 2300 -- -- -- -- 16 --  04/04/23 2200 -- -- -- -- 16 --  04/04/23 2100 -- -- -- -- 18 --  04/04/23 2036 126/66 98.5 F (36.9 C) Oral 80 18 --  04/04/23 1833 -- -- -- -- 18 --  04/04/23 1727 -- -- -- -- 19 --  04/04/23 1603 (!) 112/51 98.5 F (36.9  C) Oral 78 19 97 %  04/04/23 1600 -- -- -- -- 18 --  04/04/23 1500 -- -- -- -- 20 --  04/04/23 1450 124/69 -- -- 91 -- --  04/04/23 1448 (!) 148/79 -- -- 77 -- --  04/04/23 1444 (!) 115/50 -- -- 74 20 --  04/04/23 1441 (!) 107/48 -- -- 76 -- --  04/04/23 1347 -- -- -- -- 20 --  04/04/23 1230 -- -- -- -- 20 --  04/04/23 1133 -- -- -- -- 20 --  04/04/23 1115 -- -- -- -- -- 98 %  04/04/23 1000 -- -- -- -- 18 --  04/04/23 0930 125/63 97.8 F (36.6 C) Oral 77 19 97 %  04/04/23 0800 -- -- -- -- 19 --   Physical exam: General: Well nourished, well developed female in no acute distress. Abdomen: obese, soft, nttp, nd, rare bowel sounds Cardiovascular: S1, S2 normal, no murmur, rub or gallop, regular rate and rhythm Respiratory: CTAB Extremities: no clubbing, cyanosis or edema Skin: Warm and dry.   Medications: Current Facility-Administered Medications  Medication Dose Route Frequency Provider Last Rate Last Admin   acetaminophen (TYLENOL) tablet 1,000 mg  1,000 mg Oral Q8H PRN Haxtun Bing, MD  coconut oil  1 Application Topical PRN [name], DO   1 Application at 04/04/23 1830   witch hazel-glycerin (TUCKS) pad 1 Application  1 Application Topical PRN Mercado-Ortiz, Jessica Q, DO       And   dibucaine (NUPERCAINAL) 1 % rectal ointment 1 Application  1 Application Rectal PRN Mercado-Ortiz, Jessica Q, DO       diphenhydrAMINE (BENADRYL) injection 12.5 mg  12.5 mg Intravenous Q4H PRN Kaylyn Layer, MD       Or   diphenhydrAMINE (BENADRYL) capsule 25 mg  25 mg Oral Q4H PRN Kaylyn Layer, MD       enoxaparin (LOVENOX) injection 80 mg  80 mg Subcutaneous Q24H Mercado-Ortiz, Jessica Q, DO   80 mg at 04/04/23 1610   fentaNYL (SUBLIMAZE) injection 50-100 mcg  50-100 mcg Intravenous Q1H PRN Hermina Staggers, MD   100 mcg at 04/02/23 2216   furosemide (LASIX) injection 20 mg  20 mg Intravenous Daily Hermina Staggers, MD   20 mg at 04/04/23 1004   labetalol  (NORMODYNE) injection 20 mg  20 mg Intravenous PRN Patrcia Dolly Q, DO   20 mg at 04/01/23 2058   And   labetalol (NORMODYNE) injection 40 mg  40 mg Intravenous PRN [name], DO       And   labetalol (NORMODYNE) injection 80 mg  80 mg Intravenous PRN Mercado-Ortiz, Lahoma Crocker, DO       And   hydrALAZINE (APRESOLINE) injection 10 mg  10 mg Intravenous PRN Mercado-Ortiz, Jessica Q, DO       ibuprofen (ADVIL) tablet 600 mg  600 mg Oral Q6H Mercado-Ortiz, Jessica Q, DO   600 mg at 04/05/23 9604   lactated ringers infusion   Intravenous Continuous [name], DO 125 mL/hr at 04/05/23 0655 New Bag at 04/05/23 5409   lactated ringers infusion   Intravenous Continuous Mercado-Ortiz, Jessica Q, DO   Stopped at 04/05/23 8119   magnesium hydroxide (MILK OF MAGNESIA) suspension 30 mL  30 mL Oral Q3 days PRN [name], DO       menthol-cetylpyridinium (CEPACOL) lozenge 3 mg  1 lozenge Oral Q2H PRN Mercado-Ortiz, Lahoma Crocker, DO       naloxone (NARCAN) injection 0.4 mg  0.4 mg Intravenous PRN Kaylyn Layer, MD       And   sodium chloride flush (NS) 0.9 % injection 3 mL  3 mL Intravenous PRN Howze, Jaclynn Major, MD       naloxone HCl (NARCAN) 2 mg in dextrose 5 % 250 mL infusion  1-4 mcg/kg/hr Intravenous Continuous PRN Kaylyn Layer, MD       ondansetron [facility]) injection 4 mg  4 mg Intravenous Q8H PRN Kaylyn Layer, MD       oxyCODONE (Oxy IR/ROXICODONE) immediate release tablet 5-10 mg  5-10 mg Oral Q4H PRN Patrcia Dolly Q, DO   10 mg at 04/05/23 0415   prenatal multivitamin tablet 1 tablet  1 tablet Oral Q1200 Patrcia Dolly Q, DO   1 tablet at 04/04/23 1224   senna-docusate (Senokot-S) tablet 2 tablet  2 tablet Oral Daily Mercado-Ortiz, Shanda Bumps Q, DO   2 tablet at 04/04/23 1003   simethicone (MYLICON) chewable tablet 80 mg  80 mg Oral TID PC Mercado-Ortiz, Jessica Q, DO   80 mg at 04/04/23 1735   zolpidem (AMBIEN) tablet 5 mg  5 mg  Oral QHS PRN [name], DO  Labs:  Recent Labs  Lab 04/03/23 0745 04/04/23 0102 04/04/23 0508  WBC 8.6 12.5* 13.4*  HGB 12.1 11.1* 11.0*  HCT 35.9* 33.0* 32.0*  PLT 200 190 192    Recent Labs  Lab 04/02/23 1640 04/03/23 0745 04/03/23 1540 04/04/23 1636  NA 132* 131* 130*  --   K 3.9 4.8 4.2  --   CL 106 104 105  --   CO2 19* 18* 18*  --   BUN 15 16 15   --   CREATININE 0.83 1.00 0.96 1.18*  CALCIUM 7.0* 6.5* 6.2*  --   PROT 5.9* 5.7* 5.3*  --   BILITOT 0.5 0.7 0.6  --   ALKPHOS 122 116 109  --   ALT 27 26 23   --   AST 21 25 20   --   GLUCOSE 118* 102* 131*  --    Radiology:  No new imaging  Assessment & Plan:  Pt improving *PP: foley up void *Severe pre-eclampsia: s/p Mg. Repeat labs ordered for this morning. Only on lasix qday *AKI: see above. Pt also on scheduled motrin.  *GDM: repeat GTT postpartum *Anx/dep: home wellbutrin and prozac ordered.  *PPx: lovenox, OOB with help *FEN/GI: SLIV, regular diet *Dispo: likely tomorrow.   Cornelia Copa MD Attending Center for [facility] (Faculty Practice) GYN Consult Phone: [phone] (M-F, [phone]) & [phone]  (Off hours, weekends, holidays)

## 2023-04-05 NOTE — Lactation Note (Signed)
This note was copied from a baby's chart. Lactation Consultation Note  Patient Name: Angelica Crawford [id number] Date: 04/05/2023 Age:39 hours Reason for consult: Follow-up assessment;Difficult latch;Primapara;1st time breastfeeding;Late-preterm 34-36.6wks;Breastfeeding assistance;Infant < 6lbs;Infant weight loss;Maternal endocrine disorder (3.51% WL)  LC entered the room and the birth parent was holding the infant.  The birth parent stated that today has been a rough day and she has not put the infant to the breast or used the pump.  LC encouraged the birth parent to call for assistance when she is ready to breastfeed or pump.  LC wrote her name on the board and exited the room.    Feeding Mother's Current Feeding Choice: Breast Milk and Formula  Consult Status Consult Status: Follow-up Date: 04/06/23 Follow-up type: In-patient    [name] 04/05/2023, 4:43 PM

## 2023-04-06 ENCOUNTER — Encounter (HOSPITAL_COMMUNITY): Payer: Self-pay | Admitting: Obstetrics and Gynecology

## 2023-04-06 LAB — COMPREHENSIVE METABOLIC PANEL
ALT: 65 U/L — ABNORMAL HIGH (ref 0–44)
AST: 65 U/L — ABNORMAL HIGH (ref 15–41)
Albumin: 1.6 g/dL — ABNORMAL LOW (ref 3.5–5.0)
Alkaline Phosphatase: 72 U/L (ref 38–126)
Anion gap: 9 (ref 5–15)
BUN: 18 mg/dL (ref 6–20)
CO2: 21 mmol/L — ABNORMAL LOW (ref 22–32)
Calcium: 7 mg/dL — ABNORMAL LOW (ref 8.9–10.3)
Chloride: 104 mmol/L (ref 98–111)
Creatinine, Ser: 1.04 mg/dL — ABNORMAL HIGH (ref 0.44–1.00)
GFR, Estimated: >60 mL/min (ref >60)
Glucose, Bld: 93 mg/dL (ref 70–99)
Potassium: 4.2 mmol/L (ref 3.5–5.1)
Sodium: 134 mmol/L — ABNORMAL LOW (ref 135–145)
Total Bilirubin: <0.1 mg/dL — ABNORMAL LOW (ref 0.3–1.2)
Total Protein: 4.9 g/dL — ABNORMAL LOW (ref 6.5–8.1)

## 2023-04-06 LAB — CBC
HCT: 28.9 % — ABNORMAL LOW (ref 36.0–46.0)
Hemoglobin: 9.8 g/dL — ABNORMAL LOW (ref 12.0–15.0)
MCH: 30.2 pg (ref 26.0–34.0)
MCHC: 33.9 g/dL (ref 30.0–36.0)
MCV: 89.2 fL (ref 80.0–100.0)
Platelets: 153 10*3/uL (ref 150–400)
RBC: 3.24 MIL/uL — ABNORMAL LOW (ref 3.87–5.11)
RDW: 14.6 % (ref 11.5–15.5)
WBC: 5.3 10*3/uL (ref 4.0–10.5)
nRBC: 0 % (ref 0.0–0.2)

## 2023-04-06 MED ORDER — NIFEDIPINE ER OSMOTIC RELEASE 60 MG PO TB24
60.0000 mg | ORAL_TABLET | Freq: Every day | ORAL | Status: DC
  Administered 2023-04-07: 60 mg via ORAL
  Filled 2023-04-06: qty 1

## 2023-04-06 MED ORDER — NIFEDIPINE ER OSMOTIC RELEASE 30 MG PO TB24
30.0000 mg | ORAL_TABLET | ORAL | Status: AC
  Administered 2023-04-06: 30 mg via ORAL
  Filled 2023-04-06: qty 1

## 2023-04-06 NOTE — Progress Notes (Signed)
POSTPARTUM PROGRESS NOTE  POD #3  Subjective:  Angelica Crawford is a 37 y.o. G1P0101 s/p primary LTCS at [redacted]w[redacted]d.  She reports she doing well. No acute events overnight. She reports she is doing well. She denies any problems with ambulating, voiding or po intake. Denies nausea or vomiting. She has  passed flatus and had a bowel movement. Pain is well controlled.  Lochia is normal.  Discussed with patient and spouse there was a slight fever and increased LFTs over the last 24 hours.  Pt advised to stay for further observation and LFT retest with anticipated d/c on 4/22  Objective: Blood pressure (!) 141/57, pulse (!) 111, temperature 97.7 F (36.5 C), temperature source Oral, resp. rate 18, height  (1.651 m), weight (!) 152.8 kg, last menstrual period 07/23/2022, SpO2 96 %, unknown if currently breastfeeding.  Physical Exam:  General: alert, cooperative and no distress Chest: no respiratory distress Heart:regular rate, distal pulses intact Abdomen: soft, nontender, nondistended, positive bowel sounds Uterine Fundus: firm, appropriately tender DVT Evaluation: No calf swelling or tenderness Extremities: mild edema Skin: warm, dry; incision clean/dry/intact w/ honeycomb dressing in place  Recent Labs    04/05/23 0815 04/06/23 0459  HGB 8.7* 9.8*  HCT 26.9* 28.9*    Assessment/Plan: Angelica Crawford is a 37 y.o. G1P0101 s/p primary at [redacted]w[redacted]d for preeclampsia with severe features.  POD#3 -  Contraception: POPs Feeding: breast and bottle  Dispo: Plan for discharge on 4/22 if no fever and LFTs stable/decrease CMP in AM.   LOS: 5 days   Mariel Aloe, Md Faculty Attending, [facility] 04/06/2023, 10:33 AM

## 2023-04-06 NOTE — Progress Notes (Signed)
Called Dr. Debroah Loop regarding high blood pressure and missed recheck on severe range due to RN unaware.

## 2023-04-06 NOTE — Lactation Note (Signed)
This note was copied from a baby's chart. Lactation Consultation Note  Patient Name: Angelica Crawford Date: 04/06/2023 Age:39 hours Reason for consult: Follow-up assessment;Infant weight loss;Late-preterm 34-36.6wks;Breastfeeding assistance (3.51% WL)  LC entered the room and the birth parent was STS with the infant.  Per the birth parent she has not had time to put the infant to the breast or pump.  The birth parent asked about her stork pump.  LC gave the birth parent her stork pump.  She had no further questions or concerns.    Maternal Data    Feeding Mother's Current Feeding Choice: Breast Milk and Formula Nipple Type: Extra Slow Flow  LATCH Score                    Lactation Tools Discussed/Used    Interventions    Discharge Pump: Stork Pump  Consult Status Consult Status: Follow-up Date: 04/07/23 Follow-up type: In-patient    [name] 04/06/2023, 12:50 PM

## 2023-04-06 NOTE — Progress Notes (Signed)
CSW received consult due to [facility] requesting postpartum resources and expressing interest in [facility]. CSW met with MOB at bedside to assess for needs and provide support. When CSW entered room, MOB was observed laying in bed. Visitors were present holding infant. CSW introduced self and received verbal consent from MOB to complete consult with visitors present. CSW explained reason for consult and inquired about resource needs.   MOB expressed interest in [facility], sharing she is not sure if she qualifies financially. CSW provided MOB with WIC income limit information and information on how to apply. MOB's visitor asked about lactation support, CSW reviewed lactation support resources. MOB declined additional needs at this time.   Signed,  Norberto Sorenson, MSW, [place], Bridget Hartshorn 04/06/2023 3:36 PM

## 2023-04-07 ENCOUNTER — Other Ambulatory Visit: Payer: BC Managed Care – PPO

## 2023-04-07 ENCOUNTER — Telehealth: Payer: Self-pay

## 2023-04-07 ENCOUNTER — Inpatient Hospital Stay (HOSPITAL_COMMUNITY): Payer: BC Managed Care – PPO

## 2023-04-07 ENCOUNTER — Other Ambulatory Visit (HOSPITAL_COMMUNITY): Payer: Self-pay

## 2023-04-07 ENCOUNTER — Inpatient Hospital Stay (HOSPITAL_COMMUNITY): Admission: RE | Admit: 2023-04-07 | Payer: BC Managed Care – PPO | Source: Home / Self Care | Admitting: Family Medicine

## 2023-04-07 LAB — COMPREHENSIVE METABOLIC PANEL
ALT: 92 U/L — ABNORMAL HIGH (ref 0–44)
AST: 60 U/L — ABNORMAL HIGH (ref 15–41)
Albumin: 1.6 g/dL — ABNORMAL LOW (ref 3.5–5.0)
Alkaline Phosphatase: 80 U/L (ref 38–126)
Anion gap: 11 (ref 5–15)
BUN: 15 mg/dL (ref 6–20)
CO2: 24 mmol/L (ref 22–32)
Calcium: 8 mg/dL — ABNORMAL LOW (ref 8.9–10.3)
Chloride: 101 mmol/L (ref 98–111)
Creatinine, Ser: 0.9 mg/dL (ref 0.44–1.00)
GFR, Estimated: >60 mL/min (ref >60)
Glucose, Bld: 131 mg/dL — ABNORMAL HIGH (ref 70–99)
Potassium: 4.3 mmol/L (ref 3.5–5.1)
Sodium: 136 mmol/L (ref 135–145)
Total Bilirubin: 0.2 mg/dL — ABNORMAL LOW (ref 0.3–1.2)
Total Protein: 4.8 g/dL — ABNORMAL LOW (ref 6.5–8.1)

## 2023-04-07 LAB — URINE CULTURE: Culture: 50000 — AB

## 2023-04-07 LAB — CALCIUM, IONIZED: Calcium, Ionized, Serum: 4.2 mg/dL — ABNORMAL LOW (ref 4.5–5.6)

## 2023-04-07 MED ORDER — ENOXAPARIN SODIUM 80 MG/0.8ML IJ SOSY
80.0000 mg | PREFILLED_SYRINGE | INTRAMUSCULAR | 0 refills | Status: DC
  Filled 2023-04-07: qty 24, 30d supply, fill #0

## 2023-04-07 MED ORDER — IBUPROFEN 600 MG PO TABS
600.0000 mg | ORAL_TABLET | Freq: Four times a day (QID) | ORAL | 1 refills | Status: AC | PRN
  Filled 2023-04-07: qty 60, 15d supply, fill #0

## 2023-04-07 MED ORDER — OXYCODONE HCL 5 MG PO TABS
5.0000 mg | ORAL_TABLET | ORAL | 0 refills | Status: DC | PRN
  Filled 2023-04-07: qty 30, 5d supply, fill #0

## 2023-04-07 MED ORDER — FUROSEMIDE 20 MG PO TABS
20.0000 mg | ORAL_TABLET | Freq: Every day | ORAL | 0 refills | Status: DC
  Filled 2023-04-07: qty 3, 3d supply, fill #0

## 2023-04-07 MED ORDER — NIFEDIPINE ER 60 MG PO TB24
60.0000 mg | ORAL_TABLET | Freq: Every day | ORAL | 2 refills | Status: DC
  Filled 2023-04-07: qty 30, 30d supply, fill #0

## 2023-04-07 MED ORDER — SENNOSIDES-DOCUSATE SODIUM 8.6-50 MG PO TABS
2.0000 | ORAL_TABLET | Freq: Two times a day (BID) | ORAL | 2 refills | Status: DC | PRN
  Filled 2023-04-07: qty 30, 8d supply, fill #0

## 2023-04-07 MED ORDER — FERROUS SULFATE 325 (65 FE) MG PO TABS
325.0000 mg | ORAL_TABLET | ORAL | 3 refills | Status: DC
  Filled 2023-04-07: qty 30, 60d supply, fill #0

## 2023-04-07 NOTE — Plan of Care (Signed)
Problem: Education: Goal: Knowledge of disease or condition will improve Outcome: Progressing Goal: Knowledge of the prescribed therapeutic regimen will improve Outcome: Progressing   Problem: Fluid Volume: Goal: Peripheral tissue perfusion will improve Outcome: Progressing   Problem: Clinical Measurements: Goal: Complications related to disease process, condition or treatment will be avoided or minimized Outcome: Progressing   Problem: Education: Goal: Knowledge of Childbirth will improve Outcome: Progressing Goal: Ability to make informed decisions regarding treatment and plan of care will improve Outcome: Progressing Goal: Ability to state and carry out methods to decrease the pain will improve Outcome: Progressing Goal: Individualized Educational Video(s) Outcome: Progressing   Problem: Coping: Goal: Ability to verbalize concerns and feelings about labor and delivery will improve Outcome: Progressing   Problem: Life Cycle: Goal: Ability to make normal progression through stages of labor will improve Outcome: Progressing Goal: Ability to effectively push during vaginal delivery will improve Outcome: Progressing   Problem: Role Relationship: Goal: Will demonstrate positive interactions with the child Outcome: Progressing   Problem: Safety: Goal: Risk of complications during labor and delivery will decrease Outcome: Progressing   Problem: Pain Management: Goal: Relief or control of pain from uterine contractions will improve Outcome: Progressing   Problem: Education: Goal: Knowledge of General Education information will improve Description: Including pain rating scale, medication(s)/side effects and non-pharmacologic comfort measures Outcome: Progressing   Problem: Health Behavior/Discharge Planning: Goal: Ability to manage health-related needs will improve Outcome: Progressing   Problem: Clinical Measurements: Goal: Ability to maintain clinical measurements  within normal limits will improve Outcome: Progressing Goal: Will remain free from infection Outcome: Progressing Goal: Diagnostic test results will improve Outcome: Progressing Goal: Respiratory complications will improve Outcome: Progressing Goal: Cardiovascular complication will be avoided Outcome: Progressing   Problem: Activity: Goal: Risk for activity intolerance will decrease Outcome: Progressing   Problem: Nutrition: Goal: Adequate nutrition will be maintained Outcome: Progressing   Problem: Coping: Goal: Level of anxiety will decrease Outcome: Progressing   Problem: Elimination: Goal: Will not experience complications related to bowel motility Outcome: Progressing Goal: Will not experience complications related to urinary retention Outcome: Progressing   Problem: Pain Managment: Goal: General experience of comfort will improve Outcome: Progressing   Problem: Safety: Goal: Ability to remain free from injury will improve Outcome: Progressing   Problem: Skin Integrity: Goal: Risk for impaired skin integrity will decrease Outcome: Progressing   Problem: Education: Goal: Ability to describe self-care measures that may prevent or decrease complications (Diabetes Survival Skills Education) will improve Outcome: Progressing Goal: Individualized Educational Video(s) Outcome: Progressing   Problem: Coping: Goal: Ability to adjust to condition or change in health will improve Outcome: Progressing   Problem: Fluid Volume: Goal: Ability to maintain a balanced intake and output will improve Outcome: Progressing   Problem: Health Behavior/Discharge Planning: Goal: Ability to identify and utilize available resources and services will improve Outcome: Progressing Goal: Ability to manage health-related needs will improve Outcome: Progressing   Problem: Metabolic: Goal: Ability to maintain appropriate glucose levels will improve Outcome: Progressing   Problem:  Nutritional: Goal: Maintenance of adequate nutrition will improve Outcome: Progressing Goal: Progress toward achieving an optimal weight will improve Outcome: Progressing   Problem: Skin Integrity: Goal: Risk for impaired skin integrity will decrease Outcome: Progressing   Problem: Tissue Perfusion: Goal: Adequacy of tissue perfusion will improve Outcome: Progressing   Problem: Education: Goal: Knowledge of the prescribed therapeutic regimen will improve Outcome: Progressing Goal: Understanding of sexual limitations or changes related to disease process or condition will improve Outcome:  Progressing Goal: Individualized Educational Video(s) Outcome: Progressing   Problem: Self-Concept: Goal: Communication of feelings regarding changes in body function or appearance will improve Outcome: Progressing   Problem: Skin Integrity: Goal: Demonstration of wound healing without infection will improve Outcome: Progressing

## 2023-04-07 NOTE — Telephone Encounter (Signed)
Called patient to get post op/bp check scheduled this week. Left message for patient to call to get scheduled.

## 2023-04-07 NOTE — Lactation Note (Signed)
This note was copied from a baby's chart. Lactation Consultation Note  Patient Name: Girl Aleene Swanner [id number] Date: 04/07/2023 Age:39 days  Reason for consult: Follow-up assessment;Preterm <34wks;1st time breastfeeding;Primapara  P1, GA [redacted]w[redacted]d, LPI  Mother being discharged to home today. Baby is bottle/ formula feeding well. Mother states she hasn't pumped or put baby to breast yet but her "plan moving forward is to work on breastfeeding". Mother states she can tell her milk is coming in and she is able to hand express.  Information reviewed about management of engorgement, milk collection and storage and Mom made aware of O/P services, breastfeeding support groups, and our phone # for post-discharge questions.       Feeding Mother's Current Feeding Choice: Formula    Discharge Discharge Education: Engorgement and breast care Pump: DEBP;Stork Pump  Consult Status Consult Status: Complete Date: 04/07/23   Omar Person 04/07/2023, 10:39 AM

## 2023-04-07 NOTE — Plan of Care (Signed)
Problem: Education: Goal: Knowledge of disease or condition will improve 04/07/2023 1146 by Donne Hazel, LPN Outcome: Adequate for Discharge 04/07/2023 1145 by Donne Hazel, LPN Outcome: Progressing 04/07/2023 0852 by Donne Hazel, LPN Outcome: Progressing Goal: Knowledge of the prescribed therapeutic regimen will improve 04/07/2023 1146 by Donne Hazel, LPN Outcome: Adequate for Discharge 04/07/2023 1145 by Donne Hazel, LPN Outcome: Progressing 04/07/2023 0852 by Donne Hazel, LPN Outcome: Progressing   Problem: Fluid Volume: Goal: Peripheral tissue perfusion will improve 04/07/2023 1146 by Donne Hazel, LPN Outcome: Adequate for Discharge 04/07/2023 1145 by Donne Hazel, LPN Outcome: Progressing 04/07/2023 0852 by Donne Hazel, LPN Outcome: Progressing   Problem: Clinical Measurements: Goal: Complications related to disease process, condition or treatment will be avoided or minimized 04/07/2023 1146 by Donne Hazel, LPN Outcome: Adequate for Discharge 04/07/2023 1145 by Donne Hazel, LPN Outcome: Progressing 04/07/2023 0852 by Donne Hazel, LPN Outcome: Progressing   Problem: Education: Goal: Knowledge of Childbirth will improve 04/07/2023 1146 by Donne Hazel, LPN Outcome: Adequate for Discharge 04/07/2023 1145 by Donne Hazel, LPN Outcome: Progressing 04/07/2023 0852 by Donne Hazel, LPN Outcome: Progressing Goal: Ability to make informed decisions regarding treatment and plan of care will improve 04/07/2023 1146 by Donne Hazel, LPN Outcome: Adequate for Discharge 04/07/2023 1145 by Donne Hazel, LPN Outcome: Progressing 04/07/2023 0852 by Donne Hazel, LPN Outcome: Progressing Goal: Ability to state and carry out methods to decrease the pain will improve 04/07/2023 1146 by Donne Hazel, LPN Outcome: Adequate for Discharge 04/07/2023 1145 by Donne Hazel, LPN Outcome: Progressing 04/07/2023 0852 by Donne Hazel, LPN Outcome:  Progressing Goal: Individualized Educational Video(s) 04/07/2023 1146 by Donne Hazel, LPN Outcome: Adequate for Discharge 04/07/2023 1145 by Donne Hazel, LPN Outcome: Progressing 04/07/2023 0852 by Donne Hazel, LPN Outcome: Progressing   Problem: Coping: Goal: Ability to verbalize concerns and feelings about labor and delivery will improve 04/07/2023 1146 by Donne Hazel, LPN Outcome: Adequate for Discharge 04/07/2023 1145 by Donne Hazel, LPN Outcome: Progressing 04/07/2023 0852 by Donne Hazel, LPN Outcome: Progressing   Problem: Life Cycle: Goal: Ability to make normal progression through stages of labor will improve 04/07/2023 1146 by Donne Hazel, LPN Outcome: Adequate for Discharge 04/07/2023 1145 by Donne Hazel, LPN Outcome: Progressing 04/07/2023 0852 by Donne Hazel, LPN Outcome: Progressing Goal: Ability to effectively push during vaginal delivery will improve 04/07/2023 1146 by Donne Hazel, LPN Outcome: Adequate for Discharge 04/07/2023 1145 by Donne Hazel, LPN Outcome: Progressing 04/07/2023 0852 by Donne Hazel, LPN Outcome: Progressing   Problem: Role Relationship: Goal: Will demonstrate positive interactions with the child 04/07/2023 1146 by Donne Hazel, LPN Outcome: Adequate for Discharge 04/07/2023 1145 by Donne Hazel, LPN Outcome: Progressing 04/07/2023 0852 by Donne Hazel, LPN Outcome: Progressing   Problem: Safety: Goal: Risk of complications during labor and delivery will decrease 04/07/2023 1146 by Donne Hazel, LPN Outcome: Adequate for Discharge 04/07/2023 1145 by Donne Hazel, LPN Outcome: Progressing 04/07/2023 0852 by Donne Hazel, LPN Outcome: Progressing   Problem: Pain Management: Goal: Relief or control of pain from uterine contractions will improve 04/07/2023 1146 by Donne Hazel, LPN Outcome: Adequate for Discharge 04/07/2023 1145 by Donne Hazel, LPN Outcome: Progressing 04/07/2023 0852 by Donne Hazel, LPN Outcome: Progressing   Problem: Education: Goal: Knowledge of General Education information will improve Description: Including  pain rating scale, medication(s)/side effects and non-pharmacologic comfort measures 04/07/2023 1146 by Donne Hazel, LPN Outcome: Adequate for Discharge 04/07/2023 1145 by Donne Hazel, LPN Outcome: Progressing 04/07/2023 0852 by Donne Hazel, LPN Outcome: Progressing   Problem: Health Behavior/Discharge Planning: Goal: Ability to manage health-related needs will improve 04/07/2023 1146 by Donne Hazel, LPN Outcome: Adequate for Discharge 04/07/2023 1145 by Donne Hazel, LPN Outcome: Progressing 04/07/2023 0852 by Donne Hazel, LPN Outcome: Progressing   Problem: Clinical Measurements: Goal: Ability to maintain clinical measurements within normal limits will improve 04/07/2023 1146 by Donne Hazel, LPN Outcome: Adequate for Discharge 04/07/2023 1145 by Donne Hazel, LPN Outcome: Progressing 04/07/2023 0852 by Donne Hazel, LPN Outcome: Progressing Goal: Will remain free from infection 04/07/2023 1146 by Donne Hazel, LPN Outcome: Adequate for Discharge 04/07/2023 1145 by Donne Hazel, LPN Outcome: Progressing 04/07/2023 0852 by Donne Hazel, LPN Outcome: Progressing Goal: Diagnostic test results will improve 04/07/2023 1146 by Donne Hazel, LPN Outcome: Adequate for Discharge 04/07/2023 1145 by Donne Hazel, LPN Outcome: Progressing 04/07/2023 0852 by Donne Hazel, LPN Outcome: Progressing Goal: Respiratory complications will improve 04/07/2023 1146 by Donne Hazel, LPN Outcome: Adequate for Discharge 04/07/2023 1145 by Donne Hazel, LPN Outcome: Progressing 04/07/2023 0852 by Donne Hazel, LPN Outcome: Progressing Goal: Cardiovascular complication will be avoided 04/07/2023 1146 by Donne Hazel, LPN Outcome: Adequate for Discharge 04/07/2023 1145 by Donne Hazel, LPN Outcome: Progressing 04/07/2023  0852 by Donne Hazel, LPN Outcome: Progressing   Problem: Activity: Goal: Risk for activity intolerance will decrease 04/07/2023 1146 by Donne Hazel, LPN Outcome: Adequate for Discharge 04/07/2023 1145 by Donne Hazel, LPN Outcome: Progressing 04/07/2023 0852 by Donne Hazel, LPN Outcome: Progressing   Problem: Nutrition: Goal: Adequate nutrition will be maintained 04/07/2023 1146 by Donne Hazel, LPN Outcome: Adequate for Discharge 04/07/2023 1145 by Donne Hazel, LPN Outcome: Progressing 04/07/2023 0852 by Donne Hazel, LPN Outcome: Progressing   Problem: Coping: Goal: Level of anxiety will decrease 04/07/2023 1146 by Donne Hazel, LPN Outcome: Adequate for Discharge 04/07/2023 1145 by Donne Hazel, LPN Outcome: Progressing 04/07/2023 0852 by Donne Hazel, LPN Outcome: Progressing   Problem: Elimination: Goal: Will not experience complications related to bowel motility 04/07/2023 1146 by Donne Hazel, LPN Outcome: Adequate for Discharge 04/07/2023 1145 by Donne Hazel, LPN Outcome: Progressing 04/07/2023 0852 by Donne Hazel, LPN Outcome: Progressing Goal: Will not experience complications related to urinary retention 04/07/2023 1146 by Donne Hazel, LPN Outcome: Adequate for Discharge 04/07/2023 1145 by Donne Hazel, LPN Outcome: Progressing 04/07/2023 0852 by Donne Hazel, LPN Outcome: Progressing   Problem: Pain Managment: Goal: General experience of comfort will improve 04/07/2023 1146 by Donne Hazel, LPN Outcome: Adequate for Discharge 04/07/2023 1145 by Donne Hazel, LPN Outcome: Progressing 04/07/2023 0852 by Donne Hazel, LPN Outcome: Progressing   Problem: Safety: Goal: Ability to remain free from injury will improve 04/07/2023 1146 by Donne Hazel, LPN Outcome: Adequate for Discharge 04/07/2023 1145 by Donne Hazel, LPN Outcome: Progressing 04/07/2023 0852 by Donne Hazel, LPN Outcome: Progressing   Problem: Skin  Integrity: Goal: Risk for impaired skin integrity will decrease 04/07/2023 1146 by Donne Hazel, LPN Outcome: Adequate for Discharge 04/07/2023 1145 by Donne Hazel, LPN Outcome: Progressing 04/07/2023 0852 by Donne Hazel, LPN Outcome: Progressing   Problem: Education: Goal: Ability to describe self-care measures  that may prevent or decrease complications (Diabetes Survival Skills Education) will improve 04/07/2023 1146 by Donne Hazel, LPN Outcome: Adequate for Discharge 04/07/2023 1145 by Donne Hazel, LPN Outcome: Progressing 04/07/2023 0852 by Donne Hazel, LPN Outcome: Progressing Goal: Individualized Educational Video(s) 04/07/2023 1146 by Donne Hazel, LPN Outcome: Adequate for Discharge 04/07/2023 1145 by Donne Hazel, LPN Outcome: Progressing 04/07/2023 0852 by Donne Hazel, LPN Outcome: Progressing   Problem: Coping: Goal: Ability to adjust to condition or change in health will improve 04/07/2023 1146 by Donne Hazel, LPN Outcome: Adequate for Discharge 04/07/2023 1145 by Donne Hazel, LPN Outcome: Progressing 04/07/2023 0852 by Donne Hazel, LPN Outcome: Progressing   Problem: Fluid Volume: Goal: Ability to maintain a balanced intake and output will improve 04/07/2023 1146 by Donne Hazel, LPN Outcome: Adequate for Discharge 04/07/2023 1145 by Donne Hazel, LPN Outcome: Progressing 04/07/2023 0852 by Donne Hazel, LPN Outcome: Progressing   Problem: Health Behavior/Discharge Planning: Goal: Ability to identify and utilize available resources and services will improve 04/07/2023 1146 by Donne Hazel, LPN Outcome: Adequate for Discharge 04/07/2023 1145 by Donne Hazel, LPN Outcome: Progressing 04/07/2023 0852 by Donne Hazel, LPN Outcome: Progressing Goal: Ability to manage health-related needs will improve 04/07/2023 1146 by Donne Hazel, LPN Outcome: Adequate for Discharge 04/07/2023 1145 by Donne Hazel, LPN Outcome:  Progressing 04/07/2023 0852 by Donne Hazel, LPN Outcome: Progressing   Problem: Metabolic: Goal: Ability to maintain appropriate glucose levels will improve 04/07/2023 1146 by Donne Hazel, LPN Outcome: Adequate for Discharge 04/07/2023 1145 by Donne Hazel, LPN Outcome: Progressing 04/07/2023 0852 by Donne Hazel, LPN Outcome: Progressing   Problem: Nutritional: Goal: Maintenance of adequate nutrition will improve 04/07/2023 1146 by Donne Hazel, LPN Outcome: Adequate for Discharge 04/07/2023 1145 by Donne Hazel, LPN Outcome: Progressing 04/07/2023 0852 by Donne Hazel, LPN Outcome: Progressing Goal: Progress toward achieving an optimal weight will improve 04/07/2023 1146 by Donne Hazel, LPN Outcome: Adequate for Discharge 04/07/2023 1145 by Donne Hazel, LPN Outcome: Progressing 04/07/2023 0852 by Donne Hazel, LPN Outcome: Progressing   Problem: Skin Integrity: Goal: Risk for impaired skin integrity will decrease 04/07/2023 1146 by Donne Hazel, LPN Outcome: Adequate for Discharge 04/07/2023 1145 by Donne Hazel, LPN Outcome: Progressing 04/07/2023 0852 by Donne Hazel, LPN Outcome: Progressing   Problem: Tissue Perfusion: Goal: Adequacy of tissue perfusion will improve 04/07/2023 1146 by Donne Hazel, LPN Outcome: Adequate for Discharge 04/07/2023 1145 by Donne Hazel, LPN Outcome: Progressing 04/07/2023 0852 by Donne Hazel, LPN Outcome: Progressing   Problem: Education: Goal: Knowledge of the prescribed therapeutic regimen will improve 04/07/2023 1146 by Donne Hazel, LPN Outcome: Adequate for Discharge 04/07/2023 1145 by Donne Hazel, LPN Outcome: Progressing 04/07/2023 0852 by Donne Hazel, LPN Outcome: Progressing Goal: Understanding of sexual limitations or changes related to disease process or condition will improve 04/07/2023 1146 by Donne Hazel, LPN Outcome: Adequate for Discharge 04/07/2023 1145 by Donne Hazel,  LPN Outcome: Progressing 04/07/2023 0852 by Donne Hazel, LPN Outcome: Progressing Goal: Individualized Educational Video(s) 04/07/2023 1146 by Donne Hazel, LPN Outcome: Adequate for Discharge 04/07/2023 1145 by Donne Hazel, LPN Outcome: Progressing 04/07/2023 0852 by Donne Hazel, LPN Outcome: Progressing   Problem: Self-Concept: Goal: Communication of feelings regarding changes in body function or appearance will improve 04/07/2023 1146 by Donne Hazel, LPN Outcome: Adequate for Discharge  04/07/2023 1145 by Donne Hazel, LPN Outcome: Progressing 04/07/2023 0852 by Donne Hazel, LPN Outcome: Progressing   Problem: Skin Integrity: Goal: Demonstration of wound healing without infection will improve 04/07/2023 1146 by Donne Hazel, LPN Outcome: Adequate for Discharge 04/07/2023 1145 by Donne Hazel, LPN Outcome: Progressing 04/07/2023 0852 by Donne Hazel, LPN Outcome: Progressing   Problem: Education: Goal: Knowledge of condition will improve Outcome: Adequate for Discharge Goal: Individualized Educational Video(s) Outcome: Adequate for Discharge Goal: Individualized Newborn Educational Video(s) Outcome: Adequate for Discharge   Problem: Activity: Goal: Will verbalize the importance of balancing activity with adequate rest periods Outcome: Adequate for Discharge Goal: Ability to tolerate increased activity will improve Outcome: Adequate for Discharge   Problem: Coping: Goal: Ability to identify and utilize available resources and services will improve Outcome: Adequate for Discharge   Problem: Life Cycle: Goal: Chance of risk for complications during the postpartum period will decrease Outcome: Adequate for Discharge   Problem: Role Relationship: Goal: Ability to demonstrate positive interaction with newborn will improve Outcome: Adequate for Discharge   Problem: Skin Integrity: Goal: Demonstration of wound healing without infection will  improve Outcome: Adequate for Discharge

## 2023-04-07 NOTE — Discharge Instructions (Signed)
WHAT TO LOOK OUT FOR: Fever of 100.4 or above Mastitis: feels like flu and breasts hurt Infection: increased pain, swelling or redness Blood clots golf ball size or larger Postpartum depression   Congratulations on your newest addition! 

## 2023-04-08 LAB — URINE CULTURE

## 2023-04-09 ENCOUNTER — Encounter: Payer: Self-pay | Admitting: Obstetrics and Gynecology

## 2023-04-09 ENCOUNTER — Other Ambulatory Visit: Payer: Self-pay | Admitting: Obstetrics and Gynecology

## 2023-04-09 DIAGNOSIS — N3000 Acute cystitis without hematuria: Secondary | ICD-10-CM

## 2023-04-09 MED ORDER — NITROFURANTOIN MONOHYD MACRO 100 MG PO CAPS
100.0000 mg | ORAL_CAPSULE | Freq: Two times a day (BID) | ORAL | 1 refills | Status: DC
Start: 2023-04-09 — End: 2024-04-29

## 2023-04-09 NOTE — Progress Notes (Signed)
Macrobid sent.  Pt givben multiple warnings regarding breast feeding with this medication.

## 2023-04-10 ENCOUNTER — Encounter: Payer: BC Managed Care – PPO | Admitting: Obstetrics & Gynecology

## 2023-04-10 ENCOUNTER — Other Ambulatory Visit: Payer: BC Managed Care – PPO

## 2023-04-10 ENCOUNTER — Ambulatory Visit (INDEPENDENT_AMBULATORY_CARE_PROVIDER_SITE_OTHER): Payer: BC Managed Care – PPO | Admitting: Advanced Practice Midwife

## 2023-04-10 ENCOUNTER — Encounter: Payer: Self-pay | Admitting: Advanced Practice Midwife

## 2023-04-10 VITALS — BP 116/80 | HR 106 | Wt 316.0 lb

## 2023-04-10 DIAGNOSIS — Z09 Encounter for follow-up examination after completed treatment for conditions other than malignant neoplasm: Secondary | ICD-10-CM

## 2023-04-10 NOTE — Patient Instructions (Signed)
Stop BP meds 2 days before postpartum visit

## 2023-04-10 NOTE — Progress Notes (Signed)
  HPI: Patient returns for routine postoperative follow-up having undergone PLTCS on 04-04-23 for failed IOL, severe preeclampsia. She elected lovenox for 6 weeks pp d/t BMI. Rx'd macrobid yesterday for + urine cx,   The patient's immediate postoperative recovery has been unremarkable. Since hospital discharge the patient reports doing well, still having pain.  Has had to take 2 oxycodone tablets some times when she goes a long time (sometimes 16 hours) w/o taking any. .   Current Outpatient Medications: buPROPion (WELLBUTRIN XL) 150 MG 24 hr tablet, Take 1 tablet (150 mg total) by mouth every morning., Disp: 30 tablet, Rfl: 0 enoxaparin (LOVENOX) 80 MG/0.8ML injection, Inject 0.8 mLs (80 mg total) into the skin daily., Disp: 33.6 mL, Rfl: 0 ferrous sulfate 325 (65 FE) MG tablet, Take 1 tablet (325 mg total) by mouth every other day., Disp: 30 tablet, Rfl: 3 FLUoxetine (PROZAC) 40 MG capsule, Take 80 mg by mouth daily. , Disp: , Rfl:  ibuprofen (ADVIL) 600 MG tablet, Take 1 tablet (600 mg total) by mouth every 6 (six) hours as needed for mild pain or moderate pain., Disp: 60 tablet, Rfl: 1 NIFEdipine (ADALAT CC) 60 MG 24 hr tablet, Take 1 tablet (60 mg total) by mouth daily., Disp: 30 tablet, Rfl: 2 oxyCODONE (OXY IR/ROXICODONE) 5 MG immediate release tablet, Take 1 tablet (5 mg total) by mouth every 4 (four) hours as needed for severe pain or breakthrough pain., Disp: 30 tablet, Rfl: 0 Prenatal Vit-Fe Fumarate-FA (PRENATAL VITAMIN PO), Take by mouth., Disp: , Rfl:  senna-docusate (SENOKOT-S) 8.6-50 MG tablet, Take 2 tablets by mouth 2 (two) times daily as needed for mild constipation or moderate constipation., Disp: 30 tablet, Rfl: 2 EPINEPHrine 0.3 mg/0.3 mL IJ SOAJ injection, Inject 0.3 mg into the muscle as needed for anaphylaxis.  (Patient not taking: Reported on 10/28/2022), Disp: , Rfl:  nitrofurantoin, macrocrystal-monohydrate, (MACROBID) 100 MG capsule, Take 1 capsule (100 mg total) by mouth  2 (two) times daily. (Patient not taking: Reported on 04/10/2023), Disp: 14 capsule, Rfl: 1  No current facility-administered medications for this visit.    Blood pressure 116/80, pulse (!) 106, weight (!) 316 lb (143.3 kg), last menstrual period 07/23/2022, currently breastfeeding.  Physical Exam: Incision C/D w/o erythema, drainage or odor.   Diagnostic Tests: none   Impression:  1 weeks s/p CS, incision looks good Preeclampsia, severe, on BP meds.    Plan: Alternate ibuprofen and tylenol q 3 hours, don't let pain get away from her Take BP every day or so; if it drops <>110/70, stop BP meds.  Otherwise, stop 2 days prior to pp appointment.  Plans Mirena at pp visit    Follow up: No follow-ups on file.   Jacklyn Shell, CNM

## 2023-04-14 ENCOUNTER — Other Ambulatory Visit: Payer: BC Managed Care – PPO

## 2023-04-17 ENCOUNTER — Encounter: Payer: BC Managed Care – PPO | Admitting: Obstetrics & Gynecology

## 2023-04-17 ENCOUNTER — Other Ambulatory Visit: Payer: BC Managed Care – PPO

## 2023-04-21 ENCOUNTER — Other Ambulatory Visit: Payer: BC Managed Care – PPO

## 2023-04-24 ENCOUNTER — Other Ambulatory Visit: Payer: BC Managed Care – PPO

## 2023-04-24 ENCOUNTER — Encounter: Payer: BC Managed Care – PPO | Admitting: Obstetrics & Gynecology

## 2023-04-28 ENCOUNTER — Other Ambulatory Visit: Payer: BC Managed Care – PPO

## 2023-05-08 ENCOUNTER — Encounter: Payer: Self-pay | Admitting: Advanced Practice Midwife

## 2023-05-08 ENCOUNTER — Ambulatory Visit (INDEPENDENT_AMBULATORY_CARE_PROVIDER_SITE_OTHER): Payer: BC Managed Care – PPO | Admitting: Advanced Practice Midwife

## 2023-05-08 DIAGNOSIS — Z3202 Encounter for pregnancy test, result negative: Secondary | ICD-10-CM

## 2023-05-08 DIAGNOSIS — Z30430 Encounter for insertion of intrauterine contraceptive device: Secondary | ICD-10-CM | POA: Insufficient documentation

## 2023-05-08 MED ORDER — LEVONORGESTREL 20 MCG/DAY IU IUD
1.0000 | INTRAUTERINE_SYSTEM | Freq: Once | INTRAUTERINE | Status: AC
  Administered 2023-05-08: 1 via INTRAUTERINE

## 2023-05-08 NOTE — Progress Notes (Signed)
Post Partum Visit Note   Chief Complaint:   Postpartum Care (Iud insertion)  History of Present Illness:   Angelica Crawford is a 37 y.o. G12P0101 Caucasian female being seen today for a postpartum visit. She is 5 weeks postpartum following a primary cesarean section, low transverse incision for failed IOL at 36.2 gestational weeks. IOL: Yes, for Preeclampsia. Anesthesia: epidural.  Laceration: n/a.  Complications: none. Inpatient contraception: no.   Pregnancy complicated by severe Preeclampsia, on Procardia 60mg .  Last dose 2 days ago. BP at home was often very low, felt dizzy at times . Tobacco use: no. Substance use disorder: no. Last pap smear: 10/18/22 and results were NILM w/ HRHPV negative. Next pap smear due: 2026 Patient's last menstrual period was 07/23/2022 (approximate).  Postpartum course has been complicated by continued elevated BP . Bleeding no bleeding. Bowel function is normal. Bladder function is normal. Urinary incontinence? No, fecal incontinence? No Patient is not sexually active. Last sexual activity: prior to birth.    Upstream - 05/08/23 1122       Pregnancy Intention Screening   Does the patient want to become pregnant in the next year? No    Does the patient's partner want to become pregnant in the next year? No    Would the patient like to discuss contraceptive options today? No      Contraception Wrap Up   Current Method IUD or IUS    End Method IUD or IUS    Contraception Counseling Provided No            The pregnancy intention screening data noted above was reviewed. Potential methods of contraception were discussed. The patient elected to proceed with IUD or IUS.  Edinburgh Postpartum Depression Screening: Negative  Edinburgh Postnatal Depression Scale - 05/08/23 1116       Edinburgh Postnatal Depression Scale:  In the Past 7 Days   I have been able to laugh and see the funny side of things. 0    I have looked forward with enjoyment to things. 0     I have blamed myself unnecessarily when things went wrong. 0    I have been anxious or worried for no good reason. 1    I have felt scared or panicky for no good reason. 0    Things have been getting on top of me. 1    I have been so unhappy that I have had difficulty sleeping. 0    I have felt sad or miserable. 0    I have been so unhappy that I have been crying. 0    The thought of harming myself has occurred to me. 0    Edinburgh Postnatal Depression Scale Total 2            Baby's course has been uncomplicated. Baby is feeding by bottle. Infant has a pediatrician/family doctor? Yes.  Childcare strategy if returning to work/school: yes.  Pt has material needs met for her and baby: Yes.   Review of Systems:   Pertinent items are noted in HPI Denies Abnormal vaginal discharge w/ itching/odor/irritation, headaches, visual changes, shortness of breath, chest pain, abdominal pain, severe nausea/vomiting, or problems with urination or bowel movements. Pertinent History Reviewed:  Reviewed past medical,surgical, obstetrical and family history.  Reviewed problem list, medications and allergies. OB History  Gravida Para Term Preterm AB Living  1 1 0 1 0 1  SAB IAB Ectopic Multiple Live Births  0 0 0 0 1    #  Outcome Date GA Lbr Len/2nd Weight Sex Delivery Anes PTL Lv  1 Preterm 04/03/23 [redacted]w[redacted]d  5 lb 0.4 oz (2.28 kg) F CS-LTranv EPI  LIV   Physical Assessment:   Vitals:   05/08/23 1108  BP: (!) 137/91  Pulse: 85  Weight: (!) 305 lb 3.2 oz (138.4 kg)  Height: 5\' 5"  (1.651 m)  Body mass index is 50.79 kg/m.  Objective:  Blood pressure (!) 137/91, pulse 85, height 5\' 5"  (1.651 m), weight (!) 305 lb 3.2 oz (138.4 kg), last menstrual period 07/23/2022, currently breastfeeding.  General:  alert, cooperative, and no distress   Breasts:  negative  Lungs: Normal respiratory effort  Heart:  regular rate and rhythm  Abdomen: soft, non-tender, incision well healed.  Yeast rash,  painted w/gentian violet   Vulva:  normal  Vagina: normal vagina  Cervix:  normal  Corpus: Well involuted  Adnexa:  not evaluated  Rectal Exam: no hemorrhoids          No results found for this or any previous visit (from the past 24 hour(s)).  Assessment & Plan:  1) Postpartum exam 2) 5 wks s/p primary cesarean section, low transverse incision 3) bottle feeding 4) Depression screening 5) Contraception management: .   The risks and benefits of the method and placement have been thouroughly reviewed with the patient and all questions were answered.  Specifically the patient is aware of failure rate of 12/998, expulsion of the IUD and of possible perforation.  The patient is aware of irregular bleeding due to the method and understands the incidence of irregular bleeding diminishes with time.  Time out was performed.  A Graves speculum was placed.  The cervix was prepped using Betadine. The uterus was found to be neutral and it sounded to 8 cm.  The cervix was grasped with a tenaculum and the Mirena IUD was inserted to 8 cm.  It was pulled back 1 cm and the IUD was disengaged.  The strings were trimmed to 3 cm.  Sonogram was performed and the proper placement of the IUD was verified.  The patient was instructed on signs and symptoms of infection and to check for the strings after each menses or each month.  The patient is to refrain from intercourse for 3 days.   Essential components of care per ACOG recommendations:  1.  Mood and well being:  If positive depression screen, discussed and plan developed.  If using tobacco we discussed reduction/cessation and risk of relapse If current substance abuse, we discussed and referral to local resources was offered.   2. Infant care and feeding:  If breastfeeding, discussed returning to work, pumping, breastfeeding-associated pain, guidance regarding return to fertility while lactating if not using another method. If needed, patient  was provided with a letter to be allowed to pump q 2-3hrs to support lactation in a private location with access to a refrigerator to store breastmilk.   Recommended that all caregivers be immunized for flu, pertussis and other preventable communicable diseases If pt does not have material needs met for her/baby, referred to local resources for help obtaining these.  3. Sexuality, contraception and birth spacing Provided guidance regarding sexuality, management of dyspareunia, and resumption of intercourse Discussed avoiding interpregnancy interval <79mths and recommended birth spacing of 18 months  4. Sleep and fatigue Discussed coping options for fatigue and sleep disruption Encouraged family/partner/community support of 4 hrs of uninterrupted sleep to help with mood and fatigue  5. Physical recovery  If pt  had a C/S, assessed incisional pain and providing guidance on normal vs prolonged recovery If pt had a laceration, perineal healing and pain reviewed.  If urinary or fecal incontinence, discussed management and referred to PT or uro/gyn if indicated  Patient is safe to resume physical activity. Discussed attainment of healthy weight.  6.  Chronic disease management Discussed pregnancy complications if any, and their implications for future childbearing and long-term maternal health. Review recommendations for prevention of recurrent pregnancy complications, such as aspirin to reduce risk of preeclampsia:  yes Pt had GDM: No. If yes, 2hr GTT scheduled: not applicable. Reviewed medications and non-pregnant dosing including consideration of whether pt is breastfeeding using a reliable resource such as LactMed: not applicable Referred for f/u w/ PCP or subspecialist providers as indicated: not applicable  7. Health maintenance Mammogram at 40yo or earlier if indicated Pap smears as indicated  Meds:  Meds ordered this encounter  Medications   levonorgestrel (MIRENA) 20 MCG/DAY IUD 1  each    Follow-up: Return in about 4 weeks (around 06/05/2023) for IUD check.   Take 1/2 procardia 60 daily;  stop 2 days prior to next appt  Orders Placed This Encounter  Procedures   POCT urine pregnancy       Jacklyn Shell DNP, CNM [facility], [facility] 05/08/2023 2:03 PM

## 2023-06-05 ENCOUNTER — Ambulatory Visit: Payer: BC Managed Care – PPO | Admitting: Adult Health

## 2023-06-10 ENCOUNTER — Ambulatory Visit: Payer: BC Managed Care – PPO | Admitting: Adult Health

## 2023-06-25 ENCOUNTER — Telehealth: Payer: Self-pay

## 2023-06-25 NOTE — Telephone Encounter (Signed)
Patient would like for you to call her about her getting a letter for work.

## 2023-06-26 NOTE — Telephone Encounter (Signed)
Patient states she needs a note for the dates she was out of work for her pregnancy and deliver.  Will send in mychart.

## 2023-08-01 ENCOUNTER — Other Ambulatory Visit: Payer: Self-pay

## 2023-08-01 ENCOUNTER — Other Ambulatory Visit (HOSPITAL_COMMUNITY): Payer: Self-pay

## 2023-08-01 MED ORDER — WEGOVY 0.25 MG/0.5ML ~~LOC~~ SOAJ
0.2500 mg | SUBCUTANEOUS | 0 refills | Status: AC
  Filled 2023-08-01: qty 2, 28d supply, fill #0

## 2023-10-29 ENCOUNTER — Other Ambulatory Visit (HOSPITAL_COMMUNITY): Payer: Self-pay

## 2024-01-09 ENCOUNTER — Ambulatory Visit: Payer: 59 | Admitting: Urology

## 2024-04-28 ENCOUNTER — Inpatient Hospital Stay (HOSPITAL_COMMUNITY)
Admission: EM | Admit: 2024-04-28 | Discharge: 2024-04-30 | DRG: 369 | Disposition: A | Discharge status: Home / Self Care | Attending: Internal Medicine | Admitting: Internal Medicine

## 2024-04-28 ENCOUNTER — Encounter (HOSPITAL_COMMUNITY): Payer: Self-pay

## 2024-04-28 ENCOUNTER — Other Ambulatory Visit: Payer: Self-pay

## 2024-04-28 ENCOUNTER — Emergency Department (HOSPITAL_COMMUNITY)

## 2024-04-28 ENCOUNTER — Ambulatory Visit
Admission: EM | Admit: 2024-04-28 | Discharge: 2024-04-28 | Disposition: A | Discharge status: Home / Self Care | Attending: Nurse Practitioner | Admitting: Nurse Practitioner

## 2024-04-28 DIAGNOSIS — Z7985 Long-term (current) use of injectable non-insulin antidiabetic drugs: Secondary | ICD-10-CM

## 2024-04-28 DIAGNOSIS — Z6841 Body Mass Index (BMI) 40.0 and over, adult: Secondary | ICD-10-CM | POA: Diagnosis not present

## 2024-04-28 DIAGNOSIS — F419 Anxiety disorder, unspecified: Secondary | ICD-10-CM | POA: Diagnosis present

## 2024-04-28 DIAGNOSIS — K209 Esophagitis, unspecified without bleeding: Principal | ICD-10-CM | POA: Diagnosis present

## 2024-04-28 DIAGNOSIS — R1013 Epigastric pain: Secondary | ICD-10-CM

## 2024-04-28 DIAGNOSIS — Z79899 Other long term (current) drug therapy: Secondary | ICD-10-CM

## 2024-04-28 DIAGNOSIS — Z7901 Long term (current) use of anticoagulants: Secondary | ICD-10-CM

## 2024-04-28 DIAGNOSIS — T281XXA Burn of esophagus, initial encounter: Secondary | ICD-10-CM | POA: Diagnosis not present

## 2024-04-28 DIAGNOSIS — F32A Depression, unspecified: Secondary | ICD-10-CM | POA: Diagnosis present

## 2024-04-28 DIAGNOSIS — E66813 Obesity, class 3: Secondary | ICD-10-CM | POA: Diagnosis present

## 2024-04-28 DIAGNOSIS — X101XXA Contact with hot food, initial encounter: Secondary | ICD-10-CM | POA: Diagnosis present

## 2024-04-28 DIAGNOSIS — I10 Essential (primary) hypertension: Secondary | ICD-10-CM | POA: Diagnosis present

## 2024-04-28 DIAGNOSIS — Z881 Allergy status to other antibiotic agents status: Secondary | ICD-10-CM

## 2024-04-28 DIAGNOSIS — Z803 Family history of malignant neoplasm of breast: Secondary | ICD-10-CM

## 2024-04-28 DIAGNOSIS — Z880 Allergy status to penicillin: Secondary | ICD-10-CM

## 2024-04-28 DIAGNOSIS — Z888 Allergy status to other drugs, medicaments and biological substances status: Secondary | ICD-10-CM

## 2024-04-28 DIAGNOSIS — K2970 Gastritis, unspecified, without bleeding: Secondary | ICD-10-CM | POA: Diagnosis present

## 2024-04-28 DIAGNOSIS — R7989 Other specified abnormal findings of blood chemistry: Secondary | ICD-10-CM | POA: Diagnosis present

## 2024-04-28 DIAGNOSIS — R109 Unspecified abdominal pain: Secondary | ICD-10-CM | POA: Insufficient documentation

## 2024-04-28 DIAGNOSIS — Z882 Allergy status to sulfonamides status: Secondary | ICD-10-CM

## 2024-04-28 DIAGNOSIS — Z8632 Personal history of gestational diabetes: Secondary | ICD-10-CM

## 2024-04-28 DIAGNOSIS — K224 Dyskinesia of esophagus: Secondary | ICD-10-CM | POA: Diagnosis not present

## 2024-04-28 DIAGNOSIS — Z833 Family history of diabetes mellitus: Secondary | ICD-10-CM

## 2024-04-28 DIAGNOSIS — G4730 Sleep apnea, unspecified: Secondary | ICD-10-CM | POA: Diagnosis present

## 2024-04-28 DIAGNOSIS — Z820 Family history of epilepsy and other diseases of the nervous system: Secondary | ICD-10-CM

## 2024-04-28 DIAGNOSIS — R112 Nausea with vomiting, unspecified: Secondary | ICD-10-CM

## 2024-04-28 DIAGNOSIS — Z87442 Personal history of urinary calculi: Secondary | ICD-10-CM

## 2024-04-28 DIAGNOSIS — K2210 Ulcer of esophagus without bleeding: Secondary | ICD-10-CM

## 2024-04-28 LAB — CBC
HCT: 45.8 % (ref 36.0–46.0)
Hemoglobin: 14.8 g/dL (ref 12.0–15.0)
MCH: 28.2 pg (ref 26.0–34.0)
MCHC: 32.3 g/dL (ref 30.0–36.0)
MCV: 87.2 fL (ref 80.0–100.0)
Platelets: 284 10*3/uL (ref 150–400)
RBC: 5.25 MIL/uL — ABNORMAL HIGH (ref 3.87–5.11)
RDW: 15.1 % (ref 11.5–15.5)
WBC: 8.4 10*3/uL (ref 4.0–10.5)
nRBC: 0 % (ref 0.0–0.2)

## 2024-04-28 LAB — COMPREHENSIVE METABOLIC PANEL WITH GFR
ALT: 27 U/L (ref 0–44)
AST: 19 U/L (ref 15–41)
Albumin: 3.5 g/dL (ref 3.5–5.0)
Alkaline Phosphatase: 70 U/L (ref 38–126)
Anion gap: 10 (ref 5–15)
BUN: 15 mg/dL (ref 6–20)
CO2: 21 mmol/L — ABNORMAL LOW (ref 22–32)
Calcium: 9.3 mg/dL (ref 8.9–10.3)
Chloride: 108 mmol/L (ref 98–111)
Creatinine, Ser: 1.07 mg/dL — ABNORMAL HIGH (ref 0.44–1.00)
GFR, Estimated: >60 mL/min (ref >60)
Glucose, Bld: 86 mg/dL (ref 70–99)
Potassium: 3.5 mmol/L (ref 3.5–5.1)
Sodium: 139 mmol/L (ref 135–145)
Total Bilirubin: 0.4 mg/dL (ref 0.0–1.2)
Total Protein: 7.2 g/dL (ref 6.5–8.1)

## 2024-04-28 LAB — URINALYSIS, ROUTINE W REFLEX MICROSCOPIC
Bilirubin Urine: NEGATIVE
Glucose, UA: NEGATIVE mg/dL
Hgb urine dipstick: NEGATIVE
Ketones, ur: NEGATIVE mg/dL
Leukocytes,Ua: NEGATIVE
Nitrite: NEGATIVE
Protein, ur: NEGATIVE mg/dL
Specific Gravity, Urine: 1.026 (ref 1.005–1.030)
pH: 5 (ref 5.0–8.0)

## 2024-04-28 LAB — I-STAT CHEM 8, ED
BUN: 17 mg/dL (ref 6–20)
Calcium, Ion: 1.24 mmol/L (ref 1.15–1.40)
Chloride: 106 mmol/L (ref 98–111)
Creatinine, Ser: 1.1 mg/dL — ABNORMAL HIGH (ref 0.44–1.00)
Glucose, Bld: 70 mg/dL (ref 70–99)
HCT: 47 % — ABNORMAL HIGH (ref 36.0–46.0)
Hemoglobin: 16 g/dL — ABNORMAL HIGH (ref 12.0–15.0)
Potassium: 3.3 mmol/L — ABNORMAL LOW (ref 3.5–5.1)
Sodium: 141 mmol/L (ref 135–145)
TCO2: 19 mmol/L — ABNORMAL LOW (ref 22–32)

## 2024-04-28 LAB — LIPASE, BLOOD: Lipase: 32 U/L (ref 11–51)

## 2024-04-28 MED ORDER — LIDOCAINE VISCOUS HCL 2 % MT SOLN
15.0000 mL | Freq: Once | OROMUCOSAL | Status: AC
  Administered 2024-04-28: 15 mL via OROMUCOSAL
  Filled 2024-04-28: qty 15

## 2024-04-28 MED ORDER — SUCRALFATE 1 GM/10ML PO SUSP
1.0000 g | Freq: Once | ORAL | Status: AC
  Administered 2024-04-28: 1 g via ORAL
  Filled 2024-04-28: qty 10

## 2024-04-28 MED ORDER — ONDANSETRON HCL 4 MG/2ML IJ SOLN: 4.0000 mg | Freq: Once | INTRAMUSCULAR | Status: DC | PRN

## 2024-04-28 MED ORDER — PANTOPRAZOLE SODIUM 40 MG IV SOLR
80.0000 mg | Freq: Once | INTRAVENOUS | Status: AC
  Administered 2024-04-28: 80 mg via INTRAVENOUS
  Filled 2024-04-28: qty 20

## 2024-04-28 MED ORDER — LIDOCAINE VISCOUS HCL 2 % MT SOLN
15.0000 mL | Freq: Once | OROMUCOSAL | Status: AC
  Administered 2024-04-28: 15 mL via OROMUCOSAL

## 2024-04-28 MED ORDER — METOCLOPRAMIDE HCL 5 MG/ML IJ SOLN
10.0000 mg | Freq: Once | INTRAMUSCULAR | Status: AC
  Administered 2024-04-28: 10 mg via INTRAVENOUS
  Filled 2024-04-28: qty 2

## 2024-04-28 MED ORDER — ONDANSETRON 4 MG PO TBDP
4.0000 mg | ORAL_TABLET | Freq: Once | ORAL | Status: AC
  Administered 2024-04-28: 4 mg via ORAL

## 2024-04-28 MED ORDER — MORPHINE SULFATE (PF) 4 MG/ML IV SOLN
8.0000 mg | Freq: Once | INTRAVENOUS | Status: AC
  Administered 2024-04-28: 8 mg via INTRAVENOUS
  Filled 2024-04-28: qty 2

## 2024-04-28 MED ORDER — MORPHINE SULFATE 15 MG PO TABS: 15.0000 mg | ORAL_TABLET | ORAL | Status: DC | PRN

## 2024-04-28 MED ORDER — ALUM & MAG HYDROXIDE-SIMETH 200-200-20 MG/5ML PO SUSP
30.0000 mL | Freq: Once | ORAL | Status: AC
  Administered 2024-04-28: 30 mL via ORAL

## 2024-04-28 NOTE — ED Triage Notes (Addendum)
 Pt reports severe abdominal pain, pt states yesterday she reheated her lunch at work and there was "hot spot" that burned her throat going down and now pt has constat burning in the abdomen feel like "heart burn x 10" seems to be progressively getting worse. Stomach hs been in knots since yesterday, having dry heave spell nausea but no vomiting. Has not been able to eat all day because abdominal pain

## 2024-04-28 NOTE — ED Triage Notes (Signed)
 Pt reports she heated her mac and cheese in the microwave yesterday and got a "hot spot" in it and did not realized until she swallowed it and felt the burn all the way down to her stomach.  Pt reports she can not eat or drink now without her stomach cramping up in knots and vomiting it back.  Pt was seen and UC and advised to come here.

## 2024-04-28 NOTE — Discharge Instructions (Signed)
 Go to the emergency department for further evaluation.

## 2024-04-28 NOTE — ED Notes (Addendum)
 Pt stated, "I am starting to feel the burn come back in my throat."

## 2024-04-28 NOTE — ED Provider Notes (Signed)
 [place] EMERGENCY DEPARTMENT AT [facility] Provider Note   CSN: [id number] Arrival date & time: 04/28/24  1920     History  Chief Complaint  Patient presents with   Abdominal Pain    Angelica Crawford is a 38 y.o. female.  HPI     38 year old female comes in with chief complaint of abdominal pain. Patient has history of migraines, anxiety and kidney stone.  She comes here from the the urgent care with symptoms of severe abdominal pain.  Patient reports that yesterday she was having mac & cheese, and when she was swallowing it, she suspects that she got one of the hotspots which caused significant burning.  She felt burning going all the way down from her throat to the stomach.  Ever since then she has had persistent pain in the chest and epigastric region.  Patient has not eaten much today, but has continued discomfort.  She describes the pain as cramping and not like pain.  Home Medications Prior to Admission medications   Medication Sig Start Date End Date Taking? Authorizing Provider  buPROPion  (WELLBUTRIN  XL) 150 MG 24 hr tablet Take 1 tablet (150 mg total) by mouth every morning. 10/28/22   Cresenzo-Dishmon, Blanca Bunch, CNM  enoxaparin  (LOVENOX ) 80 MG/0.8ML injection Inject 0.8 mLs (80 mg total) into the skin daily. 04/08/23 05/20/23  Anyanwu, Ugonna A, MD  EPINEPHrine  0.3 mg/0.3 mL IJ SOAJ injection Inject 0.3 mg into the muscle as needed for anaphylaxis.  Patient not taking: Reported on 10/28/2022 06/12/20   [provider]  ferrous sulfate  325 (65 FE) MG tablet Take 1 tablet (325 mg total) by mouth every other day. Patient not taking: Reported on 05/08/2023 04/07/23   Anyanwu, Ugonna A, MD  FLUoxetine  (PROZAC ) 40 MG capsule Take 80 mg by mouth daily.  08/15/18   [provider]  ibuprofen  (ADVIL ) 600 MG tablet Take 1 tablet (600 mg total) by mouth every 6 (six) hours as needed for mild pain or moderate pain. Patient not taking: Reported on 05/08/2023  04/07/23   Anyanwu, Ugonna A, MD  NIFEdipine  (ADALAT  CC) 60 MG 24 hr tablet Take 1 tablet (60 mg total) by mouth daily. Patient not taking: Reported on 05/08/2023 04/07/23   Anyanwu, Ugonna A, MD  nitrofurantoin , macrocrystal-monohydrate, (MACROBID ) 100 MG capsule Take 1 capsule (100 mg total) by mouth 2 (two) times daily. Patient not taking: Reported on 04/10/2023 04/09/23   Abigail Abler, MD  oxyCODONE  (OXY IR/ROXICODONE ) 5 MG immediate release tablet Take 1 tablet (5 mg total) by mouth every 4 (four) hours as needed for severe pain or breakthrough pain. Patient not taking: Reported on 05/08/2023 04/07/23   Anyanwu, Ugonna A, MD  Prenatal Vit-Fe Fumarate-FA (PRENATAL VITAMIN PO) Take by mouth.    [provider]  Semaglutide -Weight Management (WEGOVY ) 0.25 MG/0.5ML SOAJ Inject 0.25 mg into the skin once a week. 08/01/23     senna-docusate (SENOKOT-S) 8.6-50 MG tablet Take 2 tablets by mouth 2 (two) times daily as needed for mild constipation or moderate constipation. Patient not taking: Reported on 05/08/2023 04/07/23   Anyanwu, Ugonna A, MD      Allergies    Penicillins, Sulfa antibiotics, Vancomycin , Cat dander, Cephalexin, Gabapentin , and Salicylic acid    Review of Systems   Review of Systems  All other systems reviewed and are negative.   Physical Exam Updated Vital Signs BP 108/73   Pulse 90   Temp 98.9 F (37.2 C)   Resp 16  Ht 5\' 5"  (1.651 m)   Wt 129.3 kg   SpO2 96%   BMI 47.43 kg/m  Physical Exam Vitals and nursing note reviewed.  Constitutional:      General: She is in acute distress.     Appearance: She is well-developed.  HENT:     Head: Atraumatic.  Cardiovascular:     Rate and Rhythm: Normal rate.  Pulmonary:     Effort: Pulmonary effort is normal.  Abdominal:     Tenderness: There is abdominal tenderness in the epigastric area. There is no guarding or rebound. Negative signs include Murphy's sign and McBurney's sign.  Musculoskeletal:     Cervical  back: Normal range of motion and neck supple.  Skin:    General: Skin is warm and dry.  Neurological:     Mental Status: She is alert and oriented to person, place, and time.     ED Results / Procedures / Treatments   Labs (all labs ordered are listed, but only abnormal results are displayed) Labs Reviewed  CBC - Abnormal; Notable for the following components:      Result Value   RBC 5.25 (*)    All other components within normal limits  I-STAT CHEM 8, ED - Abnormal; Notable for the following components:   Potassium 3.3 (*)    Creatinine, Ser 1.10 (*)    TCO2 19 (*)    Hemoglobin 16.0 (*)    HCT 47.0 (*)    All other components within normal limits  URINALYSIS, ROUTINE W REFLEX MICROSCOPIC  LIPASE, BLOOD  COMPREHENSIVE METABOLIC PANEL WITH GFR  POC URINE PREG, ED    EKG None  Radiology DG Chest Port 1 View Result Date: 04/28/2024 CLINICAL DATA:  Vomiting.  Chest pain. EXAM: PORTABLE CHEST 1 VIEW COMPARISON:  08/22/2022 FINDINGS: The cardiac silhouette, mediastinal and hilar contours are within normal limits. The lungs are clear. No infiltrates or effusions. No pneumothorax. No free air is seen under the hemidiaphragms. The bony thorax is intact. IMPRESSION: No acute cardiopulmonary findings. Electronically Signed   By: Marrian Siva M.D.   On: 04/28/2024 21:43    Procedures Procedures    Medications Ordered in ED Medications  ondansetron  (ZOFRAN ) injection 4 mg (has no administration in time range)  metoCLOPramide  (REGLAN ) injection 10 mg (10 mg Intravenous Given 04/28/24 2115)  morphine  (PF) 4 MG/ML injection 8 mg (8 mg Intravenous Given 04/28/24 2115)  pantoprazole  (PROTONIX ) injection 80 mg (80 mg Intravenous Given 04/28/24 2115)  sucralfate (CARAFATE) 1 GM/10ML suspension 1 g (1 g Oral Given 04/28/24 2114)  lidocaine  (XYLOCAINE ) 2 % viscous mouth solution 15 mL (15 mLs Mouth/Throat Given 04/28/24 2113)    ED Course/ Medical Decision Making/ A&P Clinical Course as of  04/28/24 2251  Wed Apr 28, 2024  2250 Case discussed with Dr. Riley Cheadle, he thinks patient will be better served by keeping her in the hospital overnight as observation.  They will scope her tomorrow.  N.p.o. after midnight. [AN]    Clinical Course User Index [AN] Deatra Face, MD                                 Medical Decision Making Amount and/or Complexity of Data Reviewed Labs: ordered. Radiology: ordered.  Risk Prescription drug management. Decision regarding hospitalization.   This patient presents to the ED with chief complaint(s) of abd pain, chest pain with pertinent past medical history of C-sections.  Patient  effectively started having severe pain ever since she swallowed " hotspot" from mac & cheese.The complaint involves an extensive differential diagnosis and also carries with it a high risk of complications and morbidity.    The differential diagnosis includes : Esophagitis, esophageal spasm, gastritis, esophageal stricture, perforated viscus, mediastinitis.  The initial plan is to get basic labs, x-ray of the chest to rule out free air and also focus on pain management.   Independent labs interpretation:  The following labs were independently interpreted: UA is normal.  CBC shows no profound leukocytosis.  Patient's labs are reassuring including creatinine.  Independent visualization and interpretation of imaging: - I independently visualized the following imaging with scope of interpretation limited to determining acute life threatening conditions related to emergency care: X-ray of the chest, which revealed no evidence of free air.  Treatment and Reassessment: Patient received multiple medications.  She has been started on p.o. challenge, which she continues to have significant discomfort with p.o. intake.  Pain is better.  We will consult GI.  Consultation: - Consulted or discussed management/test interpretation with external professional: Case discussed with  Dr. Riley Cheadle.  He recommends that patient be admitted to the hospital, kept n.p.o. after midnight and they will scope her tomorrow.   Final Clinical Impression(s) / ED Diagnoses Final diagnoses:  Esophagitis  Esophageal spasm    Rx / DC Orders ED Discharge Orders     None         Deatra Face, MD 04/28/24 2257

## 2024-04-28 NOTE — ED Notes (Signed)
 Patient is being discharged from the Urgent Care and sent to the Emergency Department via POV . Per Veria Glassing, NP, patient is in need of higher level of care due to abdominal pain with nausea and vomiting. Patient is aware and verbalizes understanding of plan of care.  Vitals:   04/28/24 1841  Pulse: (!) 109  Resp: 18  Temp: 98.4 F (36.9 C)  SpO2: 97%

## 2024-04-28 NOTE — H&P (Signed)
 History and Physical    Patient: Angelica Crawford JWJ:[id number] DOB: [birth year] DOA: 04/28/2024 DOS: the patient was seen and examined on 04/29/2024 PCP: Perley Bradley, MD  Patient coming from: Home  Chief Complaint:  Chief Complaint  Patient presents with   Abdominal Pain   HPI: Angelica Crawford is a 38 y.o. female with medical history significant of migraine, depression who presents to the emergency department from urgent care due to severe abdominal pain.  Patient states that she had mac & cheese yesterday, accidentally, she got one of the hotspots while swallowing and she felt burning along the throat to the stomach.  She has since being complaining of pain in the chest and in the epigastric area.  Pain was crampy in nature.  She denies nausea, vomiting.  ED Course:  In the emergency department, she was hemodynamically stable.  Workup in the ED showed normal CBC.  BMP was normal except for bicarb of 21, creatinine 1.07.  Lipase 32, urinalysis was normal. Chest x-ray showed no acute cardiopulmonary findings She was treated with Reglan , morphine , Protonix  and Carafate. Gastroenterology (Dr. Riley Cheadle) was consulted and recommended admitting patient with plan to scope her in the morning.  Review of Systems: Review of systems as noted in the HPI. All other systems reviewed and are negative.   Past Medical History:  Diagnosis Date   Anxiety    Back pain    Depression    Gestational diabetes    Kidney stones    Mental disorder    Migraines    Pregnancy induced hypertension    Seizures (HCC)    Sleep apnea    Vaginal Pap smear, abnormal    Past Surgical History:  Procedure Laterality Date   CESAREAN SECTION N/A 04/03/2023   Procedure: CESAREAN SECTION;  Surgeon: Othelia Blinks, MD;  Location: [facility];  Service: Obstetrics;  Laterality: N/A;   EXTRACORPOREAL SHOCK WAVE LITHOTRIPSY Right 03/19/2022   Procedure: EXTRACORPOREAL SHOCK WAVE LITHOTRIPSY (ESWL);  Surgeon: Marco Severs, MD;  Location: [facility];  Service: Urology;  Laterality: Right;    Social History:  reports that she has never smoked. She has never used smokeless tobacco. She reports that she does not currently use alcohol. She reports that she does not use drugs.   Allergies  Allergen Reactions   Penicillins Anaphylaxis   Sulfa Antibiotics Anaphylaxis   Vancomycin  Itching, Rash and Other (See Comments)    Throat itching   Cat Dander     Other Reaction(s): hay fever reaction   Cephalexin     Other Reaction(s): hives/swelling throat   Gabapentin  Other (See Comments)    tremors   Salicylic Acid Other (See Comments)    Family History  Problem Relation Age of Onset   Other Mother        MS   Multiple sclerosis Mother    Cancer Mother    Breast cancer Mother    Diabetes Father    Other Maternal Grandmother        MS   Cancer Paternal Grandfather      Prior to Admission medications   Medication Sig Start Date End Date Taking? Authorizing Provider  buPROPion  (WELLBUTRIN  XL) 150 MG 24 hr tablet Take 1 tablet (150 mg total) by mouth every morning. 10/28/22   Cresenzo-Dishmon, Blanca Bunch, CNM  enoxaparin  (LOVENOX ) 80 MG/0.8ML injection Inject 0.8 mLs (80 mg total) into the skin daily. 04/08/23 05/20/23  Anyanwu, Ugonna A, MD  EPINEPHrine  0.3 mg/0.3 mL IJ SOAJ  injection Inject 0.3 mg into the muscle as needed for anaphylaxis.  Patient not taking: Reported on 10/28/2022 06/12/20   [provider]  ferrous sulfate  325 (65 FE) MG tablet Take 1 tablet (325 mg total) by mouth every other day. Patient not taking: Reported on 05/08/2023 04/07/23   Anyanwu, Ugonna A, MD  FLUoxetine  (PROZAC ) 40 MG capsule Take 80 mg by mouth daily.  08/15/18   [provider]  ibuprofen  (ADVIL ) 600 MG tablet Take 1 tablet (600 mg total) by mouth every 6 (six) hours as needed for mild pain or moderate pain. Patient not taking: Reported on 05/08/2023 04/07/23   Anyanwu, Ugonna A, MD  NIFEdipine  (ADALAT  CC) 60  MG 24 hr tablet Take 1 tablet (60 mg total) by mouth daily. Patient not taking: Reported on 05/08/2023 04/07/23   Anyanwu, Ugonna A, MD  nitrofurantoin , macrocrystal-monohydrate, (MACROBID ) 100 MG capsule Take 1 capsule (100 mg total) by mouth 2 (two) times daily. Patient not taking: Reported on 04/10/2023 04/09/23   Abigail Abler, MD  oxyCODONE  (OXY IR/ROXICODONE ) 5 MG immediate release tablet Take 1 tablet (5 mg total) by mouth every 4 (four) hours as needed for severe pain or breakthrough pain. Patient not taking: Reported on 05/08/2023 04/07/23   Anyanwu, Ugonna A, MD  Prenatal Vit-Fe Fumarate-FA (PRENATAL VITAMIN PO) Take by mouth.    [provider]  Semaglutide -Weight Management (WEGOVY ) 0.25 MG/0.5ML SOAJ Inject 0.25 mg into the skin once a week. 08/01/23     senna-docusate (SENOKOT-S) 8.6-50 MG tablet Take 2 tablets by mouth 2 (two) times daily as needed for mild constipation or moderate constipation. Patient not taking: Reported on 05/08/2023 04/07/23   Julianne Octave, MD    Physical Exam: BP 111/71 (BP Location: Right Arm)   Pulse 87   Temp 98.1 F (36.7 C) (Oral)   Resp 17   Ht 5\' 3"  (1.6 m)   Wt 129.9 kg   SpO2 98%   BMI 50.73 kg/m   General: 38 y.o. year-old female well developed well nourished in no acute distress.  Alert and oriented x3. HEENT: NCAT, EOMI Neck: Supple, trachea medial Cardiovascular: Regular rate and rhythm with no rubs or gallops.  No thyromegaly or JVD noted.  No lower extremity edema. 2/4 pulses in all 4 extremities. Respiratory: Clear to auscultation with no wheezes or rales. Good inspiratory effort. Abdomen: Soft, tender to palpation in the epigastric area without guarding.  Nondistended with normal bowel sounds x4 quadrants. Muskuloskeletal: No cyanosis, clubbing or edema noted bilaterally Neuro: CN II-XII intact, strength 5/5 x 4, sensation, reflexes intact Skin: No ulcerative lesions noted or rashes Psychiatry: Judgement and insight  appear normal. Mood is appropriate for condition and setting          Labs on Admission:  Basic Metabolic Panel: Recent Labs  Lab 04/28/24 2015 04/28/24 2249  NA 139 141  K 3.5 3.3*  CL 108 106  CO2 21*  --   GLUCOSE 86 70  BUN 15 17  CREATININE 1.07* 1.10*  CALCIUM  9.3  --    Liver Function Tests: Recent Labs  Lab 04/28/24 2015  AST 19  ALT 27  ALKPHOS 70  BILITOT 0.4  PROT 7.2  ALBUMIN 3.5   Recent Labs  Lab 04/28/24 2015  LIPASE 32   No results for input(s): "AMMONIA" in the last 168 hours. CBC: Recent Labs  Lab 04/28/24 2015 04/28/24 2249  WBC 8.4  --   HGB 14.8 16.0*  HCT 45.8 47.0*  MCV 87.2  --   PLT 284  --    Cardiac Enzymes: No results for input(s): "CKTOTAL", "CKMB", "CKMBINDEX", "TROPONINI" in the last 168 hours.  BNP (last 3 results) No results for input(s): "BNP" in the last 8760 hours.  ProBNP (last 3 results) No results for input(s): "PROBNP" in the last 8760 hours.  CBG: No results for input(s): "GLUCAP" in the last 168 hours.  Radiological Exams on Admission: DG Chest Port 1 View Result Date: 04/28/2024 CLINICAL DATA:  Vomiting.  Chest pain. EXAM: PORTABLE CHEST 1 VIEW COMPARISON:  08/22/2022 FINDINGS: The cardiac silhouette, mediastinal and hilar contours are within normal limits. The lungs are clear. No infiltrates or effusions. No pneumothorax. No free air is seen under the hemidiaphragms. The bony thorax is intact. IMPRESSION: No acute cardiopulmonary findings. Electronically Signed   By: Marrian Siva M.D.   On: 04/28/2024 21:43    EKG: I independently viewed the EKG done and my findings are as followed: EKG was not done in the ED  Assessment/Plan Present on Admission:  Esophagitis  Principal Problem:   Esophagitis Active Problems:   Abdominal pain  Esophagitis Abdominal pain Continue fentanyl  as needed Continue Protonix  Continue Zofran  as needed Patient will be kept n.p.o. at midnight in anticipation for endoscopy  in the morning Gastroenterology was consulted and recommended admitting patient with plan to scope patient in the morning  Morbid obesity (BMI 50.73) Patient was counseled about the cardiovascular and metabolic risk of morbid obesity. Patient was counseled for diet control, exercise regimen and weight loss.    DVT prophylaxis: SCDs (consider starting chemoprophylaxis s/p endoscopy)  Code Status: Full code  Family Communication: None at bedside  Consults: Gastroenterology  Severity of Illness: The appropriate patient status for this patient is OBSERVATION. Observation status is judged to be reasonable and necessary in order to provide the required intensity of service to ensure the patient's safety. The patient's presenting symptoms, physical exam findings, and initial radiographic and laboratory data in the context of their medical condition is felt to place them at decreased risk for further clinical deterioration. Furthermore, it is anticipated that the patient will be medically stable for discharge from the hospital within 2 midnights of admission.   Author: Oseph Imburgia, DO 04/29/2024 2:48 AM  For on call review [email or web address].

## 2024-04-28 NOTE — ED Provider Notes (Signed)
 [facility]    CSN: [id number] Arrival date & time: 04/28/24  1835      History   Chief Complaint No chief complaint on file.   HPI Angelica Crawford is a 38 y.o. female.   The history is provided by the patient.   Patient presents for complaints of "severe" epigastric abdominal pain with dry heaving that is been present for the past 24 hours.  Patient states when she was at work yesterday, she reheated her lunch, thinks that the lunch may have been too high as it "burned my throat, and burned all the way down."  She also complains of burping a lot.  Patient states since that time, she states that she feels a "burning sensation in her abdomen.  She rates her pain 10/10 at present, describes her abdominal pain as "knots" in her stomach.  States that has not eaten anything for most of the day, states when she went to eat, she began not feeling well.  Denies fever, chills, chest pain, vomiting, diarrhea, constipation, gas, or bloating.  Denies prior history of reflux disease.  States she has not taken any medication for her symptoms.  Past Medical History:  Diagnosis Date   Anxiety    Back pain    Depression    Gestational diabetes    Kidney stones    Mental disorder    Migraines    Pregnancy induced hypertension    Seizures (HCC)    Sleep apnea    Vaginal Pap smear, abnormal     Patient Active Problem List   Diagnosis Date Noted   Encounter for IUD insertion 05/08/2023   AKI (acute kidney injury) (HCC) 04/05/2023   Status post primary low transverse cesarean section 04/03/2023   Preeclampsia, severe 04/01/2023   Excessive weight gain during pregnancy 03/13/2023   Polyhydramnios 03/13/2023   Gestational hypertension 03/05/2023   GDM (gestational diabetes mellitus) 02/07/2023   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   Encounter for supervision of high risk pregnancy in third trimester, antepartum 10/28/2022   Anxiety 10/28/2022   Depression 10/28/2022   Migraines  10/28/2022   Morbid obesity with BMI of 50.0-59.9, adult (HCC) 10/28/2022    Past Surgical History:  Procedure Laterality Date   CESAREAN SECTION N/A 04/03/2023   Procedure: CESAREAN SECTION;  Surgeon: Othelia Blinks, MD;  Location: [facility];  Service: Obstetrics;  Laterality: N/A;   EXTRACORPOREAL SHOCK WAVE LITHOTRIPSY Right 03/19/2022   Procedure: EXTRACORPOREAL SHOCK WAVE LITHOTRIPSY (ESWL);  Surgeon: Marco Severs, MD;  Location: [facility];  Service: Urology;  Laterality: Right;    OB History     Gravida  1   Para  1   Term  0   Preterm  1   AB  0   Living  1      SAB  0   IAB  0   Ectopic  0   Multiple  0   Live Births  1            Home Medications    Prior to Admission medications   Medication Sig Start Date End Date Taking? Authorizing Provider  buPROPion  (WELLBUTRIN  XL) 150 MG 24 hr tablet Take 1 tablet (150 mg total) by mouth every morning. 10/28/22   Cresenzo-Dishmon, Blanca Bunch, CNM  enoxaparin  (LOVENOX ) 80 MG/0.8ML injection Inject 0.8 mLs (80 mg total) into the skin daily. 04/08/23 05/20/23  Anyanwu, Ugonna A, MD  EPINEPHrine  0.3 mg/0.3 mL IJ SOAJ injection Inject 0.3 mg into  the muscle as needed for anaphylaxis.  Patient not taking: Reported on 10/28/2022 06/12/20   [provider]  ferrous sulfate  325 (65 FE) MG tablet Take 1 tablet (325 mg total) by mouth every other day. Patient not taking: Reported on 05/08/2023 04/07/23   Anyanwu, Ugonna A, MD  FLUoxetine  (PROZAC ) 40 MG capsule Take 80 mg by mouth daily.  08/15/18   [provider]  ibuprofen  (ADVIL ) 600 MG tablet Take 1 tablet (600 mg total) by mouth every 6 (six) hours as needed for mild pain or moderate pain. Patient not taking: Reported on 05/08/2023 04/07/23   Anyanwu, Ugonna A, MD  NIFEdipine  (ADALAT  CC) 60 MG 24 hr tablet Take 1 tablet (60 mg total) by mouth daily. Patient not taking: Reported on 05/08/2023 04/07/23   Anyanwu, Ugonna A, MD  nitrofurantoin ,  macrocrystal-monohydrate, (MACROBID ) 100 MG capsule Take 1 capsule (100 mg total) by mouth 2 (two) times daily. Patient not taking: Reported on 04/10/2023 04/09/23   Abigail Abler, MD  oxyCODONE  (OXY IR/ROXICODONE ) 5 MG immediate release tablet Take 1 tablet (5 mg total) by mouth every 4 (four) hours as needed for severe pain or breakthrough pain. Patient not taking: Reported on 05/08/2023 04/07/23   Anyanwu, Ugonna A, MD  Prenatal Vit-Fe Fumarate-FA (PRENATAL VITAMIN PO) Take by mouth.    [provider]  Semaglutide -Weight Management (WEGOVY ) 0.25 MG/0.5ML SOAJ Inject 0.25 mg into the skin once a week. 08/01/23     senna-docusate (SENOKOT-S) 8.6-50 MG tablet Take 2 tablets by mouth 2 (two) times daily as needed for mild constipation or moderate constipation. Patient not taking: Reported on 05/08/2023 04/07/23   Julianne Octave, MD    Family History Family History  Problem Relation Age of Onset   Other Mother        MS   Multiple sclerosis Mother    Cancer Mother    Breast cancer Mother    Diabetes Father    Other Maternal Grandmother        MS   Cancer Paternal Grandfather     Social History Social History   Tobacco Use   Smoking status: Never   Smokeless tobacco: Never  Vaping Use   Vaping status: Never Used  Substance Use Topics   Alcohol use: Not Currently    Comment: occas   Drug use: No     Allergies   Penicillins, Sulfa antibiotics, Vancomycin , Cat dander, Cephalexin, Gabapentin , and Salicylic acid   Review of Systems Review of Systems Per HPI  Physical Exam Triage Vital Signs ED Triage Vitals  Encounter Vitals Group     BP --      Systolic BP Percentile --      Diastolic BP Percentile --      Pulse Rate 04/28/24 1841 (!) 109     Resp 04/28/24 1841 18     Temp 04/28/24 1841 98.4 F (36.9 C)     Temp Source 04/28/24 1841 Oral     SpO2 04/28/24 1841 97 %     Weight --      Height --      Head Circumference --      Peak Flow --      Pain  Score 04/28/24 1846 10     Pain Loc --      Pain Education --      Exclude from Growth Chart --    No data found.  Updated Vital Signs Pulse (!) 109   Temp 98.4 F (36.9  C) (Oral)   Resp 18   SpO2 97%   Breastfeeding No   Visual Acuity Right Eye Distance:   Left Eye Distance:   Bilateral Distance:    Right Eye Near:   Left Eye Near:    Bilateral Near:     Physical Exam Vitals and nursing note reviewed.  Constitutional:      Appearance: Normal appearance. She is ill-appearing.  HENT:     Head: Normocephalic.  Eyes:     Extraocular Movements: Extraocular movements intact.     Conjunctiva/sclera: Conjunctivae normal.     Pupils: Pupils are equal, round, and reactive to light.  Cardiovascular:     Rate and Rhythm: Normal rate and regular rhythm.     Pulses: Normal pulses.     Heart sounds: Normal heart sounds.  Pulmonary:     Effort: Pulmonary effort is normal. No respiratory distress.     Breath sounds: Normal breath sounds. No stridor. No wheezing, rhonchi or rales.  Abdominal:     General: Bowel sounds are normal.     Palpations: Abdomen is soft.     Tenderness: There is no abdominal tenderness. There is no right CVA tenderness, left CVA tenderness, guarding or rebound.  Musculoskeletal:     Cervical back: Normal range of motion.  Skin:    General: Skin is warm and dry.  Neurological:     General: No focal deficit present.     Mental Status: She is alert and oriented to person, place, and time.  Psychiatric:        Mood and Affect: Mood normal.        Behavior: Behavior normal.      UC Treatments / Results  Labs (all labs ordered are listed, but only abnormal results are displayed) Labs Reviewed - No data to display  EKG   Radiology No results found.  Procedures Procedures (including critical care time)  Medications Ordered in UC Medications - No data to display  Initial Impression / Assessment and Plan / UC Course  I have reviewed the  triage vital signs and the nursing notes.  Pertinent labs & imaging results that were available during my care of the patient were reviewed by me and considered in my medical decision making (see chart for details).  Zofran  4 mg ODT administered, waited before administering GI cocktail.  Patient unable to keep down the GI cocktail as she developed nausea and vomiting.  States that her abdominal pain is "still in knots."  On exam, patient without guarding or rebound tenderness.  She does complain of epigastric pain.  Difficult to determine the origin of the patient's symptoms at this time.Given patient's current symptoms, patient advises recommended that she follow-up in the emergency department for further evaluation.  Patient was in agreement with this plan of care and verbalized understanding.  All questions were answered.  Patient's vital signs are stable, she is able to travel to the emergency department via private vehicle.  Patient discharged to the emergency department, patient ambulatory at discharge.  Final Clinical Impressions(s) / UC Diagnoses   Final diagnoses:  None   Discharge Instructions   None    ED Prescriptions   None    PDMP not reviewed this encounter.   Hardy Lia, NP 04/28/24 1918

## 2024-04-29 ENCOUNTER — Inpatient Hospital Stay (HOSPITAL_COMMUNITY): Admitting: Certified Registered"

## 2024-04-29 ENCOUNTER — Encounter (HOSPITAL_COMMUNITY)
Admission: EM | Disposition: A | Discharge status: Home / Self Care | Payer: Self-pay | Source: Home / Self Care | Attending: Internal Medicine

## 2024-04-29 DIAGNOSIS — T281XXA Burn of esophagus, initial encounter: Secondary | ICD-10-CM | POA: Diagnosis present

## 2024-04-29 DIAGNOSIS — Z820 Family history of epilepsy and other diseases of the nervous system: Secondary | ICD-10-CM | POA: Diagnosis not present

## 2024-04-29 DIAGNOSIS — Z881 Allergy status to other antibiotic agents status: Secondary | ICD-10-CM | POA: Diagnosis not present

## 2024-04-29 DIAGNOSIS — Z6841 Body Mass Index (BMI) 40.0 and over, adult: Secondary | ICD-10-CM | POA: Diagnosis not present

## 2024-04-29 DIAGNOSIS — F419 Anxiety disorder, unspecified: Secondary | ICD-10-CM | POA: Diagnosis present

## 2024-04-29 DIAGNOSIS — F32A Depression, unspecified: Secondary | ICD-10-CM | POA: Diagnosis present

## 2024-04-29 DIAGNOSIS — K2210 Ulcer of esophagus without bleeding: Secondary | ICD-10-CM

## 2024-04-29 DIAGNOSIS — K209 Esophagitis, unspecified without bleeding: Secondary | ICD-10-CM | POA: Diagnosis not present

## 2024-04-29 DIAGNOSIS — K224 Dyskinesia of esophagus: Secondary | ICD-10-CM | POA: Diagnosis present

## 2024-04-29 DIAGNOSIS — Z833 Family history of diabetes mellitus: Secondary | ICD-10-CM | POA: Diagnosis not present

## 2024-04-29 DIAGNOSIS — Z8632 Personal history of gestational diabetes: Secondary | ICD-10-CM | POA: Diagnosis not present

## 2024-04-29 DIAGNOSIS — I10 Essential (primary) hypertension: Secondary | ICD-10-CM | POA: Diagnosis present

## 2024-04-29 DIAGNOSIS — Z87442 Personal history of urinary calculi: Secondary | ICD-10-CM | POA: Diagnosis not present

## 2024-04-29 DIAGNOSIS — R1013 Epigastric pain: Secondary | ICD-10-CM | POA: Diagnosis not present

## 2024-04-29 DIAGNOSIS — Z803 Family history of malignant neoplasm of breast: Secondary | ICD-10-CM | POA: Diagnosis not present

## 2024-04-29 DIAGNOSIS — Z880 Allergy status to penicillin: Secondary | ICD-10-CM | POA: Diagnosis not present

## 2024-04-29 DIAGNOSIS — Z888 Allergy status to other drugs, medicaments and biological substances status: Secondary | ICD-10-CM | POA: Diagnosis not present

## 2024-04-29 DIAGNOSIS — K2970 Gastritis, unspecified, without bleeding: Secondary | ICD-10-CM | POA: Diagnosis present

## 2024-04-29 DIAGNOSIS — R7989 Other specified abnormal findings of blood chemistry: Secondary | ICD-10-CM | POA: Diagnosis present

## 2024-04-29 DIAGNOSIS — Z79899 Other long term (current) drug therapy: Secondary | ICD-10-CM | POA: Diagnosis not present

## 2024-04-29 DIAGNOSIS — E66813 Obesity, class 3: Secondary | ICD-10-CM | POA: Diagnosis present

## 2024-04-29 DIAGNOSIS — Z882 Allergy status to sulfonamides status: Secondary | ICD-10-CM | POA: Diagnosis not present

## 2024-04-29 DIAGNOSIS — R109 Unspecified abdominal pain: Secondary | ICD-10-CM | POA: Insufficient documentation

## 2024-04-29 DIAGNOSIS — R1310 Dysphagia, unspecified: Secondary | ICD-10-CM | POA: Diagnosis not present

## 2024-04-29 DIAGNOSIS — Z7985 Long-term (current) use of injectable non-insulin antidiabetic drugs: Secondary | ICD-10-CM | POA: Diagnosis not present

## 2024-04-29 DIAGNOSIS — G4730 Sleep apnea, unspecified: Secondary | ICD-10-CM | POA: Diagnosis present

## 2024-04-29 DIAGNOSIS — X101XXA Contact with hot food, initial encounter: Secondary | ICD-10-CM | POA: Diagnosis present

## 2024-04-29 DIAGNOSIS — Z7901 Long term (current) use of anticoagulants: Secondary | ICD-10-CM | POA: Diagnosis not present

## 2024-04-29 HISTORY — PX: ESOPHAGOGASTRODUODENOSCOPY: SHX5428

## 2024-04-29 LAB — CBC
HCT: 42.4 % (ref 36.0–46.0)
Hemoglobin: 13.8 g/dL (ref 12.0–15.0)
MCH: 28.5 pg (ref 26.0–34.0)
MCHC: 32.5 g/dL (ref 30.0–36.0)
MCV: 87.6 fL (ref 80.0–100.0)
Platelets: 246 10*3/uL (ref 150–400)
RBC: 4.84 MIL/uL (ref 3.87–5.11)
RDW: 15.2 % (ref 11.5–15.5)
WBC: 7 10*3/uL (ref 4.0–10.5)
nRBC: 0 % (ref 0.0–0.2)

## 2024-04-29 LAB — COMPREHENSIVE METABOLIC PANEL WITH GFR
ALT: 23 U/L (ref 0–44)
AST: 14 U/L — ABNORMAL LOW (ref 15–41)
Albumin: 3.1 g/dL — ABNORMAL LOW (ref 3.5–5.0)
Alkaline Phosphatase: 63 U/L (ref 38–126)
Anion gap: 9 (ref 5–15)
BUN: 17 mg/dL (ref 6–20)
CO2: 22 mmol/L (ref 22–32)
Calcium: 8.9 mg/dL (ref 8.9–10.3)
Chloride: 109 mmol/L (ref 98–111)
Creatinine, Ser: 1.15 mg/dL — ABNORMAL HIGH (ref 0.44–1.00)
GFR, Estimated: >60 mL/min (ref >60)
Glucose, Bld: 95 mg/dL (ref 70–99)
Potassium: 4 mmol/L (ref 3.5–5.1)
Sodium: 140 mmol/L (ref 135–145)
Total Bilirubin: 0.8 mg/dL (ref 0.0–1.2)
Total Protein: 6.7 g/dL (ref 6.5–8.1)

## 2024-04-29 LAB — MAGNESIUM: Magnesium: 2.3 mg/dL (ref 1.7–2.4)

## 2024-04-29 LAB — HIV ANTIBODY (ROUTINE TESTING W REFLEX): HIV Screen 4th Generation wRfx: NONREACTIVE

## 2024-04-29 LAB — PHOSPHORUS: Phosphorus: 3.5 mg/dL (ref 2.5–4.6)

## 2024-04-29 SURGERY — EGD (ESOPHAGOGASTRODUODENOSCOPY)
Anesthesia: General

## 2024-04-29 MED ORDER — ONDANSETRON HCL 4 MG PO TABS: 4.0000 mg | ORAL_TABLET | Freq: Four times a day (QID) | ORAL | Status: DC | PRN

## 2024-04-29 MED ORDER — PANTOPRAZOLE SODIUM 40 MG IV SOLR
40.0000 mg | INTRAVENOUS | Status: DC
  Administered 2024-04-29 – 2024-04-30 (×2): 40 mg via INTRAVENOUS
  Filled 2024-04-29 (×2): qty 10

## 2024-04-29 MED ORDER — ACETAMINOPHEN 650 MG RE SUPP: 650.0000 mg | Freq: Four times a day (QID) | RECTAL | Status: DC | PRN

## 2024-04-29 MED ORDER — PROPOFOL 10 MG/ML IV BOLUS
INTRAVENOUS | Status: DC | PRN
  Administered 2024-04-29: 100 mg via INTRAVENOUS
  Administered 2024-04-29: 50 mg via INTRAVENOUS

## 2024-04-29 MED ORDER — LIDOCAINE 2% (20 MG/ML) 5 ML SYRINGE
INTRAMUSCULAR | Status: DC | PRN
Start: 2024-04-29 — End: 2024-04-29
  Administered 2024-04-29: 100 mg via INTRAVENOUS

## 2024-04-29 MED ORDER — HYDROMORPHONE HCL 1 MG/ML IJ SOLN
0.5000 mg | INTRAMUSCULAR | Status: DC | PRN
  Administered 2024-04-29 – 2024-04-30 (×4): 0.5 mg via INTRAVENOUS
  Filled 2024-04-29 (×4): qty 0.5

## 2024-04-29 MED ORDER — PROPOFOL 500 MG/50ML IV EMUL
INTRAVENOUS | Status: DC | PRN
  Administered 2024-04-29: 200 ug/kg/min via INTRAVENOUS

## 2024-04-29 MED ORDER — ONDANSETRON HCL 4 MG/2ML IJ SOLN: 4.0000 mg | Freq: Four times a day (QID) | INTRAMUSCULAR | Status: DC | PRN

## 2024-04-29 MED ORDER — SUCRALFATE 1 GM/10ML PO SUSP
1.0000 g | Freq: Two times a day (BID) | ORAL | Status: DC
  Administered 2024-04-29 – 2024-04-30 (×2): 1 g via ORAL
  Filled 2024-04-29 (×2): qty 10

## 2024-04-29 MED ORDER — HYDRALAZINE HCL 20 MG/ML IJ SOLN: 10.0000 mg | INTRAMUSCULAR | Status: DC | PRN

## 2024-04-29 MED ORDER — DEXMEDETOMIDINE HCL IN NACL 80 MCG/20ML IV SOLN
INTRAVENOUS | Status: AC
  Filled 2024-04-29: qty 20

## 2024-04-29 MED ORDER — DEXMEDETOMIDINE HCL IN NACL 80 MCG/20ML IV SOLN
INTRAVENOUS | Status: DC | PRN
  Administered 2024-04-29: 8 ug via INTRAVENOUS

## 2024-04-29 MED ORDER — MAGIC MOUTHWASH W/LIDOCAINE
10.0000 mL | Freq: Three times a day (TID) | ORAL | Status: DC
  Administered 2024-04-29 (×2): 10 mL via ORAL
  Filled 2024-04-29 (×6): qty 10

## 2024-04-29 MED ORDER — ACETAMINOPHEN 325 MG PO TABS: 650.0000 mg | ORAL_TABLET | Freq: Four times a day (QID) | ORAL | Status: DC | PRN

## 2024-04-29 MED ORDER — LACTATED RINGERS IV SOLN: INTRAVENOUS | Status: DC | PRN

## 2024-04-29 MED ORDER — FENTANYL CITRATE PF 50 MCG/ML IJ SOSY
25.0000 ug | PREFILLED_SYRINGE | INTRAMUSCULAR | Status: DC | PRN
  Administered 2024-04-29 (×5): 25 ug via INTRAVENOUS
  Filled 2024-04-29 (×5): qty 1

## 2024-04-29 NOTE — Anesthesia Preprocedure Evaluation (Signed)
 Anesthesia Evaluation  Patient identified by MRN, date of birth, ID band Patient awake    Reviewed: Allergy & Precautions, H&P , NPO status , Patient's Chart, lab work & pertinent test results, reviewed documented beta blocker date and time   Airway Mallampati: II  TM Distance: >3 FB Neck ROM: full    Dental no notable dental hx.    Pulmonary sleep apnea    Pulmonary exam normal breath sounds clear to auscultation       Cardiovascular Exercise Tolerance: Good hypertension,  Rhythm:regular Rate:Normal     Neuro/Psych  Headaches, Seizures -,  PSYCHIATRIC DISORDERS Anxiety Depression       GI/Hepatic negative GI ROS, Neg liver ROS,,,  Endo/Other  diabetes    Renal/GU Renal disease  negative genitourinary   Musculoskeletal   Abdominal   Peds  Hematology negative hematology ROS (+)   Anesthesia Other Findings   Reproductive/Obstetrics negative OB ROS                             Anesthesia Physical Anesthesia Plan  ASA: 3  Anesthesia Plan: General   Post-op Pain Management:    Induction:   PONV Risk Score and Plan: Propofol infusion  Airway Management Planned:   Additional Equipment:   Intra-op Plan:   Post-operative Plan:   Informed Consent: I have reviewed the patients History and Physical, chart, labs and discussed the procedure including the risks, benefits and alternatives for the proposed anesthesia with the patient or authorized representative who has indicated his/her understanding and acceptance.     Dental Advisory Given  Plan Discussed with: CRNA  Anesthesia Plan Comments:        Anesthesia Quick Evaluation

## 2024-04-29 NOTE — TOC CM/SW Note (Signed)
 Transition of Care [facility]) - Inpatient Brief Assessment   Patient Details  Name: Angelica Crawford MRN: [id number] Date of Birth: August 13, 1986  Transition of Care [facility]) CM/SW Contact:    Grandville Lax, LCSWA Phone Number: 04/29/2024, 8:44 AM   Clinical Narrative: Transition of Care Department [facility]) has reviewed patient and no TOC needs have been identified at this time. We will continue to monitor patient advancement through interdiciplinary progression rounds. If new patient transition needs arise, please place a TOC consult.   Transition of Care Asessment: Insurance and Status: Insurance coverage has been reviewed Patient has primary care physician: Yes Home environment has been reviewed: From home Prior level of function:: Independent Prior/Current Home Services: No current home services Social Drivers of Health Review: SDOH reviewed no interventions necessary Readmission risk has been reviewed: Yes Transition of care needs: no transition of care needs at this time

## 2024-04-29 NOTE — Brief Op Note (Signed)
 Patient underwent EGD under propofol sedation.  Tolerated the procedure adequately.   FINDINGS:   - Esophageal ulcers with no bleeding and no stigmata of recent bleeding. Biopsied.   - Gastritis. Biopsied.   - Normal duodenal bulb and second portion of the duodenum.  RECOMMENDATIONS  - Soft diet.   - Await pathology results.  -May consider  Repeat upper endoscopy in 8 weeks for surveillance based on pathology results.   - Return to GI clinic at appointment to be scheduled. -   Protonix  40mg  BID for 2 weeks followed by daily for total of 8 weeks.   - Magic mouth wash for symptomatic relief   - liquid Caralfate  Teola Felipe Faizan Mayrene Bastarache, MD Gastroenterology and Hepatology [facility]

## 2024-04-29 NOTE — Consult Note (Signed)
 Gastroenterology Consult   Referring Provider: No ref. provider found Primary Care Physician:  Perley Bradley, MD Primary Gastroenterologist:  unassigned (Dr. Alita Irwin)  Patient ID: Angelica Crawford; [id number]; 03/18/1986   Admit date: 04/28/2024  LOS: 0 days   Date of Consultation: 04/29/2024  Reason for Consultation:  epigastric pain, esophagitis  History of Present Illness   Angelica Crawford is a 38 y.o. year old female with history of migraines, obesity, and depression who presented to the ED yesterday at recommendation from urgent care due to severe epigastric pain and burning.  Per notes from urgent care she had eaten mac & cheese the day prior which was very hot and states when she swallowed it she felt it burning all the way down to her stomach and since then was complaining of chest and epigastric pain that was more crampy in nature.  She attempted to swallow a GI cocktail however did vomit this up but has not experienced any other nausea or vomiting.  GI consulted to assist in evaluation.  Also according to urgent care notes she had overall not been feeling well lately therefore she wanted to try something bland so she tried mac & cheese and that is when she ended up burning her throat.  When she presented to urgent care she was having 10 out of 10 pain and reporting her stomach was in knots.  She denied any vomiting, diarrhea, constipation, gas, or bloating and also no history of reflux.  She also had not tried anything over-the-counter to help with her pain.  She was provided Zofran  prior to GI cocktail however she developed worsening nausea and vomiting after taking the GI cocktail.  ED course: Vital signs stable Normal CBC, lipase 32, UA normal.  BMP unremarkable other than mildly elevated creatinine at 1.07. Chest x-ray normal She was given morphine , Protonix , Carafate, and Reglan    Today:  She denies any tobacco use, alcohol use, or chronic NSAID use.  Has not had this  happen before.  States on Tuesday after reading her food she began to have constant pain in her epigastric region and her throat.  Her epigastric pain is described as a constant stabbing pain and has a sharp/burning sensation in her throat.  She has been unable to swallow much since then without pain.  States soon as she tried the GI cocktail at urgent care this immediately came back up stating it was not settling on her stomach.  Her stomach has felt like senna constant knot.  Pain does not radiate anywhere.  It does seem to worsen with any oral intake.  Pantoprazole  has eased some of her pain however.  No family history of stomach issues or esophageal disease.  She denies any typical reflux symptoms.  Was in normal state of health up until Tuesday when this happened.  Overnight she has received multiple doses of IV fentanyl  and she received a dose of IV pantoprazole  this morning.  Home medications include iron every other day, nifedipine , Wegovy , Senokot.  This medication list may be old however as she reported is not taking senna, nifedipine , and iron in May 2024.  Appears she may take Wellbutrin  for her depression.  Patient states that her home medications are topiramate, fluoxetine , Wellbutrin , and phentermine.    Past Medical History:  Diagnosis Date   Anxiety    Back pain    Depression    Gestational diabetes    Kidney stones    Mental disorder    Migraines  Pregnancy induced hypertension    Seizures (HCC)    Sleep apnea    Vaginal Pap smear, abnormal     Past Surgical History:  Procedure Laterality Date   CESAREAN SECTION N/A 04/03/2023   Procedure: CESAREAN SECTION;  Surgeon: Othelia Blinks, MD;  Location: [facility];  Service: Obstetrics;  Laterality: N/A;   EXTRACORPOREAL SHOCK WAVE LITHOTRIPSY Right 03/19/2022   Procedure: EXTRACORPOREAL SHOCK WAVE LITHOTRIPSY (ESWL);  Surgeon: Marco Severs, MD;  Location: [facility];  Service: Urology;  Laterality: Right;    Prior to  Admission medications   Medication Sig Start Date End Date Taking? Authorizing Provider  buPROPion  (WELLBUTRIN  XL) 150 MG 24 hr tablet Take 1 tablet (150 mg total) by mouth every morning. 10/28/22   Cresenzo-Dishmon, Blanca Bunch, CNM  enoxaparin  (LOVENOX ) 80 MG/0.8ML injection Inject 0.8 mLs (80 mg total) into the skin daily. 04/08/23 05/20/23  Anyanwu, Ugonna A, MD  EPINEPHrine  0.3 mg/0.3 mL IJ SOAJ injection Inject 0.3 mg into the muscle as needed for anaphylaxis.  Patient not taking: Reported on 10/28/2022 06/12/20   [provider]  ferrous sulfate  325 (65 FE) MG tablet Take 1 tablet (325 mg total) by mouth every other day. Patient not taking: Reported on 05/08/2023 04/07/23   Anyanwu, Ugonna A, MD  FLUoxetine  (PROZAC ) 40 MG capsule Take 80 mg by mouth daily.  08/15/18   [provider]  ibuprofen  (ADVIL ) 600 MG tablet Take 1 tablet (600 mg total) by mouth every 6 (six) hours as needed for mild pain or moderate pain. Patient not taking: Reported on 05/08/2023 04/07/23   Anyanwu, Ugonna A, MD  NIFEdipine  (ADALAT  CC) 60 MG 24 hr tablet Take 1 tablet (60 mg total) by mouth daily. Patient not taking: Reported on 05/08/2023 04/07/23   Anyanwu, Ugonna A, MD  nitrofurantoin , macrocrystal-monohydrate, (MACROBID ) 100 MG capsule Take 1 capsule (100 mg total) by mouth 2 (two) times daily. Patient not taking: Reported on 04/10/2023 04/09/23   Abigail Abler, MD  oxyCODONE  (OXY IR/ROXICODONE ) 5 MG immediate release tablet Take 1 tablet (5 mg total) by mouth every 4 (four) hours as needed for severe pain or breakthrough pain. Patient not taking: Reported on 05/08/2023 04/07/23   Anyanwu, Ugonna A, MD  Prenatal Vit-Fe Fumarate-FA (PRENATAL VITAMIN PO) Take by mouth.    [provider]  Semaglutide -Weight Management (WEGOVY ) 0.25 MG/0.5ML SOAJ Inject 0.25 mg into the skin once a week. 08/01/23     senna-docusate (SENOKOT-S) 8.6-50 MG tablet Take 2 tablets by mouth 2 (two) times daily as needed for  mild constipation or moderate constipation. Patient not taking: Reported on 05/08/2023 04/07/23   Anyanwu, Ugonna A, MD    Current Facility-Administered Medications  Medication Dose Route Frequency Provider Last Rate Last Admin   acetaminophen  (TYLENOL ) tablet 650 mg  650 mg Oral Q6H PRN Adefeso, Oladapo, DO       Or   acetaminophen  (TYLENOL ) suppository 650 mg  650 mg Rectal Q6H PRN Adefeso, Oladapo, DO       fentaNYL  (SUBLIMAZE ) injection 25 mcg  25 mcg Intravenous Q2H PRN Adefeso, Oladapo, DO   25 mcg at 04/29/24 0731   ondansetron  (ZOFRAN ) tablet 4 mg  4 mg Oral Q6H PRN Adefeso, Oladapo, DO       Or   ondansetron  (ZOFRAN ) injection 4 mg  4 mg Intravenous Q6H PRN Adefeso, Oladapo, DO       pantoprazole  (PROTONIX ) injection 40 mg  40 mg Intravenous Q24H Adefeso, Oladapo, DO   40  mg at 04/29/24 0734    Allergies as of 04/28/2024 - Review Complete 04/28/2024  Allergen Reaction Noted   Penicillins Anaphylaxis 10/08/2016   Sulfa antibiotics Anaphylaxis 10/08/2016   Vancomycin  Itching, Rash, and Other (See Comments) 04/02/2023   Cat dander  04/28/2024   Cephalexin  04/28/2024   Gabapentin  Other (See Comments) 11/04/2018   Salicylic acid Other (See Comments) 01/28/2022    Family History  Problem Relation Age of Onset   Other Mother        MS   Multiple sclerosis Mother    Cancer Mother    Breast cancer Mother    Diabetes Father    Other Maternal Grandmother        MS   Cancer Paternal Grandfather     Social History   Socioeconomic History   Marital status: Single    Spouse name: Not on file   Number of children: Not on file   Years of education: Not on file   Highest education level: Not on file  Occupational History   Not on file  Tobacco Use   Smoking status: Never   Smokeless tobacco: Never  Vaping Use   Vaping status: Never Used  Substance and Sexual Activity   Alcohol use: Not Currently    Comment: occas   Drug use: No   Sexual activity: Not Currently     Birth control/protection: None  Other Topics Concern   Not on file  Social History Narrative   Not on file   Social Drivers of Health   Financial Resource Strain: Low Risk  (10/28/2022)   Overall Financial Resource Strain (CARDIA)    Difficulty of Paying Living Expenses: Not very hard  Food Insecurity: No Food Insecurity (04/28/2024)   Hunger Vital Sign    Worried About Running Out of Food in the Last Year: Never true    Ran Out of Food in the Last Year: Never true  Transportation Needs: No Transportation Needs (04/28/2024)   PRAPARE - Administrator, Civil Service (Medical): No    Lack of Transportation (Non-Medical): No  Physical Activity: Insufficiently Active (10/28/2022)   Exercise Vital Sign    Days of Exercise per Week: 5 days    Minutes of Exercise per Session: 10 min  Stress: No Stress Concern Present (10/28/2022)   [employer] of Occupational Health - Occupational Stress Questionnaire    Feeling of Stress : Only a little  Social Connections: Moderately Integrated (10/28/2022)   Social Connection and Isolation Panel [NHANES]    Frequency of Communication with Friends and Family: More than three times a week    Frequency of Social Gatherings with Friends and Family: Three times a week    Attends Religious Services: 1 to 4 times per year    Active Member of Clubs or Organizations: No    Attends Banker Meetings: Never    Marital Status: Living with partner  Intimate Partner Violence: Not At Risk (04/28/2024)   Humiliation, Afraid, Rape, and Kick questionnaire    Fear of Current or Ex-Partner: No    Emotionally Abused: No    Physically Abused: No    Sexually Abused: No     Review of Systems   Gen: Denies any fever, chills, loss of appetite, change in weight or weight loss CV: Denies chest pain, heart palpitations, syncope, edema  Resp: Denies shortness of breath with rest, cough, wheezing, coughing up blood, and pleurisy. GI: see  HPI GU : Denies urinary burning,  blood in urine, urinary frequency, and urinary incontinence. Psych: + depression. Denies depression,memory loss, hallucinations, and confusion. Heme: Denies bruising or bleeding Neuro:  Denies any headaches, dizziness, paresthesias, shaking  Physical Exam   Vital Signs in last 24 hours: Temp:  [97.7 F (36.5 C)-98.9 F (37.2 C)] 97.7 F (36.5 C) (05/15 0730) Pulse Rate:  [70-109] 70 (05/15 0730) Resp:  [16-20] 20 (05/15 0730) BP: (108-150)/(71-106) 123/105 (05/15 0730) SpO2:  [96 %-100 %] 100 % (05/15 0730) FiO2 (%):  [21 %] 21 % (05/15 0001) Weight:  [129.3 kg-129.9 kg] 129.9 kg (05/14 2348) Last BM Date : 04/27/24  General:   Alert,  Well-developed, well-nourished, pleasant and cooperative in NAD Head:  Normocephalic and atraumatic. Eyes:  Sclera clear, no icterus.   Conjunctiva pink. Ears:  Normal auditory acuity. Mouth:  No deformity or lesions, dentition normal. Neck:  Supple; no masses Abdomen:  Soft, nondistended, rounded.  TTP to epigastrium.  UTA for HSM.  Normal bowel sounds, without guarding, and without rebound.   Rectal: Deferred Msk:  Symmetrical without gross deformities. Normal posture. Extremities:  Without clubbing or edema. Neurologic:  Alert and  oriented x4. Skin:  Intact without significant lesions or rashes. Psych:  Alert and cooperative. Normal mood and affect.  Intake/Output from previous day: No intake/output data recorded. Intake/Output this shift: No intake/output data recorded.   Labs/Studies   Recent Labs Recent Labs    04/28/24 2015 04/28/24 2249 04/29/24 0405  WBC 8.4  --  7.0  HGB 14.8 16.0* 13.8  HCT 45.8 47.0* 42.4  PLT 284  --  246   BMET Recent Labs    04/28/24 2015 04/28/24 2249 04/29/24 0405  NA 139 141 140  K 3.5 3.3* 4.0  CL 108 106 109  CO2 21*  --  22  GLUCOSE 86 70 95  BUN 15 17 17   CREATININE 1.07* 1.10* 1.15*  CALCIUM  9.3  --  8.9   LFT Recent Labs    04/28/24 2015  04/29/24 0405  PROT 7.2 6.7  ALBUMIN 3.5 3.1*  AST 19 14*  ALT 27 23  ALKPHOS 70 63  BILITOT 0.4 0.8   PT/INR No results for input(s): "LABPROT", "INR" in the last 72 hours. Hepatitis Panel No results for input(s): "HEPBSAG", "HCVAB", "HEPAIGM", "HEPBIGM" in the last 72 hours. C-Diff No results for input(s): "CDIFFTOX" in the last 72 hours.  Radiology/Studies DG Chest Port 1 View Result Date: 04/28/2024 CLINICAL DATA:  Vomiting.  Chest pain. EXAM: PORTABLE CHEST 1 VIEW COMPARISON:  08/22/2022 FINDINGS: The cardiac silhouette, mediastinal and hilar contours are within normal limits. The lungs are clear. No infiltrates or effusions. No pneumothorax. No free air is seen under the hemidiaphragms. The bony thorax is intact. IMPRESSION: No acute cardiopulmonary findings. Electronically Signed   By: Marrian Siva M.D.   On: 04/28/2024 21:43   Assessment   Angelica Crawford is a 38 y.o. year old female with history of migraines and depression who presented to the ED with complaint of epigastric and throat pain that occurred after eating a hot meal.  GI consulted for further evaluation given concern for esophagitis.  Epigastric pain, odynophagia: See notes began after eating hot/reheated food on Tuesday afternoon.  Has had worsening pain with any oral intake since then.  Feeling a constant knotting/stabbing pain in the epigastric region although has improved with pantoprazole .  Unable to tolerate GI cocktail previously as she vomited this back up.  No other further vomiting or nausea has been  present.  Denies typical reflux symptoms prior to this.  No reports of NSAID use, alcohol use, or tobacco use.  Etiology unclear at this time however could be esophagitis, gastritis, duodenitis, peptic ulcer disease, or structural abnormality.  Will also likely take biopsies to rule out H. pylori.  Discussed proceeding with an upper endoscopy for further evaluation and she is in agreement therefore we will plan to  be performed today, she has been n.p.o. since midnight.   I have discussed the risks, alternatives, benefits with regards to but not limited to the risk of reaction to medication, bleeding, infection, perforation and the patient is agreeable to proceed. Written consent to be obtained.   Plan / Recommendations   N.p.o. EGD today with Dr. Alita Irwin Continue pantoprazole  IV daily Antiemetics and pain control per hospitalist Further recommendations to follow post EGD.     04/29/2024, 9:37 AM  Julian Obey, MSN, FNP-BC, AGACNP-BC [facility]

## 2024-04-29 NOTE — Interval H&P Note (Signed)
 History and Physical Interval Note:  04/29/2024 3:12 PM  Amberlee C Alkema  has presented today for surgery, with the diagnosis of Epigastric pain, odynophagia, esophagitis?Aaron Aas  The various methods of treatment have been discussed with the patient and family. After consideration of risks, benefits and other options for treatment, the patient has consented to  Procedure(s): EGD (ESOPHAGOGASTRODUODENOSCOPY) (N/A) as a surgical intervention.  The patient's history has been reviewed, patient examined, no change in status, stable for surgery.  I have reviewed the patient's chart and labs.  Questions were answered to the patient's satisfaction.    We will proceed with EGD as scheduled.    I thoroughly discussed with the patient the procedure, including the risks involved. Patient understands what the procedure involves including the benefits and any risks. Patient understands alternatives to the proposed procedure. Risks including (but not limited to) bleeding, tearing of the lining (perforation), rupture of adjacent organs, problems with heart and lung function, infection, and medication reactions. A small percentage of complications may require surgery, hospitalization, repeat endoscopic procedure, and/or transfusion.  Patient understood and agreed.    Forbes Ida Shantale Holtmeyer

## 2024-04-29 NOTE — Plan of Care (Signed)

## 2024-04-29 NOTE — ED Notes (Signed)
Pregnancy Test NEGATIVE

## 2024-04-29 NOTE — H&P (View-Only) (Signed)
 Gastroenterology Consult   Referring Provider: No ref. provider found Primary Care Physician:  Perley Bradley, MD Primary Gastroenterologist:  unassigned (Dr. Alita Irwin)  Patient ID: Angelica Crawford; [id number]; 03/18/1986   Admit date: 04/28/2024  LOS: 0 days   Date of Consultation: 04/29/2024  Reason for Consultation:  epigastric pain, esophagitis  History of Present Illness   Angelica Crawford is a 38 y.o. year old female with history of migraines, obesity, and depression who presented to the ED yesterday at recommendation from urgent care due to severe epigastric pain and burning.  Per notes from urgent care she had eaten mac & cheese the day prior which was very hot and states when she swallowed it she felt it burning all the way down to her stomach and since then was complaining of chest and epigastric pain that was more crampy in nature.  She attempted to swallow a GI cocktail however did vomit this up but has not experienced any other nausea or vomiting.  GI consulted to assist in evaluation.  Also according to urgent care notes she had overall not been feeling well lately therefore she wanted to try something bland so she tried mac & cheese and that is when she ended up burning her throat.  When she presented to urgent care she was having 10 out of 10 pain and reporting her stomach was in knots.  She denied any vomiting, diarrhea, constipation, gas, or bloating and also no history of reflux.  She also had not tried anything over-the-counter to help with her pain.  She was provided Zofran  prior to GI cocktail however she developed worsening nausea and vomiting after taking the GI cocktail.  ED course: Vital signs stable Normal CBC, lipase 32, UA normal.  BMP unremarkable other than mildly elevated creatinine at 1.07. Chest x-ray normal She was given morphine , Protonix , Carafate, and Reglan    Today:  She denies any tobacco use, alcohol use, or chronic NSAID use.  Has not had this  happen before.  States on Tuesday after reading her food she began to have constant pain in her epigastric region and her throat.  Her epigastric pain is described as a constant stabbing pain and has a sharp/burning sensation in her throat.  She has been unable to swallow much since then without pain.  States soon as she tried the GI cocktail at urgent care this immediately came back up stating it was not settling on her stomach.  Her stomach has felt like senna constant knot.  Pain does not radiate anywhere.  It does seem to worsen with any oral intake.  Pantoprazole  has eased some of her pain however.  No family history of stomach issues or esophageal disease.  She denies any typical reflux symptoms.  Was in normal state of health up until Tuesday when this happened.  Overnight she has received multiple doses of IV fentanyl  and she received a dose of IV pantoprazole  this morning.  Home medications include iron every other day, nifedipine , Wegovy , Senokot.  This medication list may be old however as she reported is not taking senna, nifedipine , and iron in May 2024.  Appears she may take Wellbutrin  for her depression.  Patient states that her home medications are topiramate, fluoxetine , Wellbutrin , and phentermine.    Past Medical History:  Diagnosis Date   Anxiety    Back pain    Depression    Gestational diabetes    Kidney stones    Mental disorder    Migraines  Pregnancy induced hypertension    Seizures (HCC)    Sleep apnea    Vaginal Pap smear, abnormal     Past Surgical History:  Procedure Laterality Date   CESAREAN SECTION N/A 04/03/2023   Procedure: CESAREAN SECTION;  Surgeon: Othelia Blinks, MD;  Location: [facility];  Service: Obstetrics;  Laterality: N/A;   EXTRACORPOREAL SHOCK WAVE LITHOTRIPSY Right 03/19/2022   Procedure: EXTRACORPOREAL SHOCK WAVE LITHOTRIPSY (ESWL);  Surgeon: Marco Severs, MD;  Location: [facility];  Service: Urology;  Laterality: Right;    Prior to  Admission medications   Medication Sig Start Date End Date Taking? Authorizing Provider  buPROPion  (WELLBUTRIN  XL) 150 MG 24 hr tablet Take 1 tablet (150 mg total) by mouth every morning. 10/28/22   Cresenzo-Dishmon, Blanca Bunch, CNM  enoxaparin  (LOVENOX ) 80 MG/0.8ML injection Inject 0.8 mLs (80 mg total) into the skin daily. 04/08/23 05/20/23  Anyanwu, Ugonna A, MD  EPINEPHrine  0.3 mg/0.3 mL IJ SOAJ injection Inject 0.3 mg into the muscle as needed for anaphylaxis.  Patient not taking: Reported on 10/28/2022 06/12/20   [provider]  ferrous sulfate  325 (65 FE) MG tablet Take 1 tablet (325 mg total) by mouth every other day. Patient not taking: Reported on 05/08/2023 04/07/23   Anyanwu, Ugonna A, MD  FLUoxetine  (PROZAC ) 40 MG capsule Take 80 mg by mouth daily.  08/15/18   [provider]  ibuprofen  (ADVIL ) 600 MG tablet Take 1 tablet (600 mg total) by mouth every 6 (six) hours as needed for mild pain or moderate pain. Patient not taking: Reported on 05/08/2023 04/07/23   Anyanwu, Ugonna A, MD  NIFEdipine  (ADALAT  CC) 60 MG 24 hr tablet Take 1 tablet (60 mg total) by mouth daily. Patient not taking: Reported on 05/08/2023 04/07/23   Anyanwu, Ugonna A, MD  nitrofurantoin , macrocrystal-monohydrate, (MACROBID ) 100 MG capsule Take 1 capsule (100 mg total) by mouth 2 (two) times daily. Patient not taking: Reported on 04/10/2023 04/09/23   Abigail Abler, MD  oxyCODONE  (OXY IR/ROXICODONE ) 5 MG immediate release tablet Take 1 tablet (5 mg total) by mouth every 4 (four) hours as needed for severe pain or breakthrough pain. Patient not taking: Reported on 05/08/2023 04/07/23   Anyanwu, Ugonna A, MD  Prenatal Vit-Fe Fumarate-FA (PRENATAL VITAMIN PO) Take by mouth.    [provider]  Semaglutide -Weight Management (WEGOVY ) 0.25 MG/0.5ML SOAJ Inject 0.25 mg into the skin once a week. 08/01/23     senna-docusate (SENOKOT-S) 8.6-50 MG tablet Take 2 tablets by mouth 2 (two) times daily as needed for  mild constipation or moderate constipation. Patient not taking: Reported on 05/08/2023 04/07/23   Anyanwu, Ugonna A, MD    Current Facility-Administered Medications  Medication Dose Route Frequency Provider Last Rate Last Admin   acetaminophen  (TYLENOL ) tablet 650 mg  650 mg Oral Q6H PRN Adefeso, Oladapo, DO       Or   acetaminophen  (TYLENOL ) suppository 650 mg  650 mg Rectal Q6H PRN Adefeso, Oladapo, DO       fentaNYL  (SUBLIMAZE ) injection 25 mcg  25 mcg Intravenous Q2H PRN Adefeso, Oladapo, DO   25 mcg at 04/29/24 0731   ondansetron  (ZOFRAN ) tablet 4 mg  4 mg Oral Q6H PRN Adefeso, Oladapo, DO       Or   ondansetron  (ZOFRAN ) injection 4 mg  4 mg Intravenous Q6H PRN Adefeso, Oladapo, DO       pantoprazole  (PROTONIX ) injection 40 mg  40 mg Intravenous Q24H Adefeso, Oladapo, DO   40  mg at 04/29/24 0734    Allergies as of 04/28/2024 - Review Complete 04/28/2024  Allergen Reaction Noted   Penicillins Anaphylaxis 10/08/2016   Sulfa antibiotics Anaphylaxis 10/08/2016   Vancomycin  Itching, Rash, and Other (See Comments) 04/02/2023   Cat dander  04/28/2024   Cephalexin  04/28/2024   Gabapentin  Other (See Comments) 11/04/2018   Salicylic acid Other (See Comments) 01/28/2022    Family History  Problem Relation Age of Onset   Other Mother        MS   Multiple sclerosis Mother    Cancer Mother    Breast cancer Mother    Diabetes Father    Other Maternal Grandmother        MS   Cancer Paternal Grandfather     Social History   Socioeconomic History   Marital status: Single    Spouse name: Not on file   Number of children: Not on file   Years of education: Not on file   Highest education level: Not on file  Occupational History   Not on file  Tobacco Use   Smoking status: Never   Smokeless tobacco: Never  Vaping Use   Vaping status: Never Used  Substance and Sexual Activity   Alcohol use: Not Currently    Comment: occas   Drug use: No   Sexual activity: Not Currently     Birth control/protection: None  Other Topics Concern   Not on file  Social History Narrative   Not on file   Social Drivers of Health   Financial Resource Strain: Low Risk  (10/28/2022)   Overall Financial Resource Strain (CARDIA)    Difficulty of Paying Living Expenses: Not very hard  Food Insecurity: No Food Insecurity (04/28/2024)   Hunger Vital Sign    Worried About Running Out of Food in the Last Year: Never true    Ran Out of Food in the Last Year: Never true  Transportation Needs: No Transportation Needs (04/28/2024)   PRAPARE - Administrator, Civil Service (Medical): No    Lack of Transportation (Non-Medical): No  Physical Activity: Insufficiently Active (10/28/2022)   Exercise Vital Sign    Days of Exercise per Week: 5 days    Minutes of Exercise per Session: 10 min  Stress: No Stress Concern Present (10/28/2022)   [employer] of Occupational Health - Occupational Stress Questionnaire    Feeling of Stress : Only a little  Social Connections: Moderately Integrated (10/28/2022)   Social Connection and Isolation Panel [NHANES]    Frequency of Communication with Friends and Family: More than three times a week    Frequency of Social Gatherings with Friends and Family: Three times a week    Attends Religious Services: 1 to 4 times per year    Active Member of Clubs or Organizations: No    Attends Banker Meetings: Never    Marital Status: Living with partner  Intimate Partner Violence: Not At Risk (04/28/2024)   Humiliation, Afraid, Rape, and Kick questionnaire    Fear of Current or Ex-Partner: No    Emotionally Abused: No    Physically Abused: No    Sexually Abused: No     Review of Systems   Gen: Denies any fever, chills, loss of appetite, change in weight or weight loss CV: Denies chest pain, heart palpitations, syncope, edema  Resp: Denies shortness of breath with rest, cough, wheezing, coughing up blood, and pleurisy. GI: see  HPI GU : Denies urinary burning,  blood in urine, urinary frequency, and urinary incontinence. Psych: + depression. Denies depression,memory loss, hallucinations, and confusion. Heme: Denies bruising or bleeding Neuro:  Denies any headaches, dizziness, paresthesias, shaking  Physical Exam   Vital Signs in last 24 hours: Temp:  [97.7 F (36.5 C)-98.9 F (37.2 C)] 97.7 F (36.5 C) (05/15 0730) Pulse Rate:  [70-109] 70 (05/15 0730) Resp:  [16-20] 20 (05/15 0730) BP: (108-150)/(71-106) 123/105 (05/15 0730) SpO2:  [96 %-100 %] 100 % (05/15 0730) FiO2 (%):  [21 %] 21 % (05/15 0001) Weight:  [129.3 kg-129.9 kg] 129.9 kg (05/14 2348) Last BM Date : 04/27/24  General:   Alert,  Well-developed, well-nourished, pleasant and cooperative in NAD Head:  Normocephalic and atraumatic. Eyes:  Sclera clear, no icterus.   Conjunctiva pink. Ears:  Normal auditory acuity. Mouth:  No deformity or lesions, dentition normal. Neck:  Supple; no masses Abdomen:  Soft, nondistended, rounded.  TTP to epigastrium.  UTA for HSM.  Normal bowel sounds, without guarding, and without rebound.   Rectal: Deferred Msk:  Symmetrical without gross deformities. Normal posture. Extremities:  Without clubbing or edema. Neurologic:  Alert and  oriented x4. Skin:  Intact without significant lesions or rashes. Psych:  Alert and cooperative. Normal mood and affect.  Intake/Output from previous day: No intake/output data recorded. Intake/Output this shift: No intake/output data recorded.   Labs/Studies   Recent Labs Recent Labs    04/28/24 2015 04/28/24 2249 04/29/24 0405  WBC 8.4  --  7.0  HGB 14.8 16.0* 13.8  HCT 45.8 47.0* 42.4  PLT 284  --  246   BMET Recent Labs    04/28/24 2015 04/28/24 2249 04/29/24 0405  NA 139 141 140  K 3.5 3.3* 4.0  CL 108 106 109  CO2 21*  --  22  GLUCOSE 86 70 95  BUN 15 17 17   CREATININE 1.07* 1.10* 1.15*  CALCIUM  9.3  --  8.9   LFT Recent Labs    04/28/24 2015  04/29/24 0405  PROT 7.2 6.7  ALBUMIN 3.5 3.1*  AST 19 14*  ALT 27 23  ALKPHOS 70 63  BILITOT 0.4 0.8   PT/INR No results for input(s): "LABPROT", "INR" in the last 72 hours. Hepatitis Panel No results for input(s): "HEPBSAG", "HCVAB", "HEPAIGM", "HEPBIGM" in the last 72 hours. C-Diff No results for input(s): "CDIFFTOX" in the last 72 hours.  Radiology/Studies DG Chest Port 1 View Result Date: 04/28/2024 CLINICAL DATA:  Vomiting.  Chest pain. EXAM: PORTABLE CHEST 1 VIEW COMPARISON:  08/22/2022 FINDINGS: The cardiac silhouette, mediastinal and hilar contours are within normal limits. The lungs are clear. No infiltrates or effusions. No pneumothorax. No free air is seen under the hemidiaphragms. The bony thorax is intact. IMPRESSION: No acute cardiopulmonary findings. Electronically Signed   By: Marrian Siva M.D.   On: 04/28/2024 21:43   Assessment   Angelica Crawford is a 38 y.o. year old female with history of migraines and depression who presented to the ED with complaint of epigastric and throat pain that occurred after eating a hot meal.  GI consulted for further evaluation given concern for esophagitis.  Epigastric pain, odynophagia: See notes began after eating hot/reheated food on Tuesday afternoon.  Has had worsening pain with any oral intake since then.  Feeling a constant knotting/stabbing pain in the epigastric region although has improved with pantoprazole .  Unable to tolerate GI cocktail previously as she vomited this back up.  No other further vomiting or nausea has been  present.  Denies typical reflux symptoms prior to this.  No reports of NSAID use, alcohol use, or tobacco use.  Etiology unclear at this time however could be esophagitis, gastritis, duodenitis, peptic ulcer disease, or structural abnormality.  Will also likely take biopsies to rule out H. pylori.  Discussed proceeding with an upper endoscopy for further evaluation and she is in agreement therefore we will plan to  be performed today, she has been n.p.o. since midnight.   I have discussed the risks, alternatives, benefits with regards to but not limited to the risk of reaction to medication, bleeding, infection, perforation and the patient is agreeable to proceed. Written consent to be obtained.   Plan / Recommendations   N.p.o. EGD today with Dr. Alita Irwin Continue pantoprazole  IV daily Antiemetics and pain control per hospitalist Further recommendations to follow post EGD.     04/29/2024, 9:37 AM  Julian Obey, MSN, FNP-BC, AGACNP-BC [facility]

## 2024-04-29 NOTE — Anesthesia Procedure Notes (Signed)
 Date/Time: 04/29/2024 3:16 PM  Performed by: Sherwin Donate, CRNAPre-anesthesia Checklist: Patient identified, Emergency Drugs available, Suction available and Patient being monitored Patient Re-evaluated:Patient Re-evaluated prior to induction Oxygen Delivery Method: Nasal cannula Induction Type: IV induction Placement Confirmation: positive ETCO2 Comments: Optiflow High Flow [place] O2 used

## 2024-04-29 NOTE — Progress Notes (Signed)
 PROGRESS NOTE    Angelica Crawford  ZOX:[id number] DOB: 09-Apr-1986 DOA: 04/28/2024 PCP: Perley Bradley, MD   Brief Narrative:     Angelica Crawford is a 38 y.o. female with medical history significant of migraine, depression who presents to the emergency department from urgent care due to severe abdominal pain.  Patient states that she had mac & cheese yesterday, accidentally, she got one of the hotspots while swallowing and she felt burning along the throat to the stomach.  She has since being complaining of pain in the chest and in the epigastric area.   Assessment & Plan:   Principal Problem:   Esophagitis Active Problems:   Morbid obesity with BMI of 50.0-59.9, adult (HCC)   Abdominal pain  Assessment and Plan:   Esophagitis/odynophagia Abdominal pain Continue fentanyl  as needed Continue Protonix  Continue Zofran  as needed Patient kept n.p.o. for possible EGD today   Morbid obesity (BMI 50.73) Patient was counseled about the cardiovascular and metabolic risk of morbid obesity. Patient was counseled for diet control, exercise regimen and weight loss.     DVT prophylaxis: SCDs Code Status: Full Family Communication: None at bedside Disposition Plan:  Status is: Inpatient Remains inpatient appropriate because: Need for IV medications.   Consultants:  GI  Procedures:  None  Antimicrobials:  None   Subjective: Patient seen and evaluated today with some improvement in pain symptoms noted, however she continues to require IV pain medications for pain control.  She denies any nausea or vomiting and is awaiting further GI evaluation.  Objective: Vitals:   04/29/24 0001 04/29/24 0408 04/29/24 0730 04/29/24 1212  BP:  109/74 (!) 123/105 119/76  Pulse:  78 70 73  Resp:  16 20 20   Temp:  98 F (36.7 C) 97.7 F (36.5 C) 98.2 F (36.8 C)  TempSrc:  Oral Oral Oral  SpO2: 98% 97% 100% 97%  Weight:      Height:        Intake/Output Summary (Last 24 hours) at  04/29/2024 1332 Last data filed at 04/29/2024 0800 Gross per 24 hour  Intake 0 ml  Output --  Net 0 ml   Filed Weights   04/28/24 2005 04/28/24 2348  Weight: 129.3 kg 129.9 kg    Examination:  General exam: Appears calm and comfortable, morbidly obese Respiratory system: Clear to auscultation. Respiratory effort normal. Cardiovascular system: S1 & S2 heard, RRR.  Gastrointestinal system: Abdomen is soft Central nervous system: Alert and awake Extremities: No edema Skin: No significant lesions noted Psychiatry: Flat affect.    Data Reviewed: I have personally reviewed following labs and imaging studies  CBC: Recent Labs  Lab 04/28/24 2015 04/28/24 2249 04/29/24 0405  WBC 8.4  --  7.0  HGB 14.8 16.0* 13.8  HCT 45.8 47.0* 42.4  MCV 87.2  --  87.6  PLT 284  --  246   Basic Metabolic Panel: Recent Labs  Lab 04/28/24 2015 04/28/24 2249 04/29/24 0405  NA 139 141 140  K 3.5 3.3* 4.0  CL 108 106 109  CO2 21*  --  22  GLUCOSE 86 70 95  BUN 15 17 17   CREATININE 1.07* 1.10* 1.15*  CALCIUM  9.3  --  8.9  MG  --   --  2.3  PHOS  --   --  3.5   GFR: Estimated Creatinine Clearance: 86.5 mL/min (A) (by C-G formula based on SCr of 1.15 mg/dL (H)). Liver Function Tests: Recent Labs  Lab 04/28/24 2015 04/29/24 0405  AST 19 14*  ALT 27 23  ALKPHOS 70 63  BILITOT 0.4 0.8  PROT 7.2 6.7  ALBUMIN 3.5 3.1*   Recent Labs  Lab 04/28/24 2015  LIPASE 32   No results for input(s): "AMMONIA" in the last 168 hours. Coagulation Profile: No results for input(s): "INR", "PROTIME" in the last 168 hours. Cardiac Enzymes: No results for input(s): "CKTOTAL", "CKMB", "CKMBINDEX", "TROPONINI" in the last 168 hours. BNP (last 3 results) No results for input(s): "PROBNP" in the last 8760 hours. HbA1C: No results for input(s): "HGBA1C" in the last 72 hours. CBG: No results for input(s): "GLUCAP" in the last 168 hours. Lipid Profile: No results for input(s): "CHOL", "HDL",  "LDLCALC", "TRIG", "CHOLHDL", "LDLDIRECT" in the last 72 hours. Thyroid Function Tests: No results for input(s): "TSH", "T4TOTAL", "FREET4", "T3FREE", "THYROIDAB" in the last 72 hours. Anemia Panel: No results for input(s): "VITAMINB12", "FOLATE", "FERRITIN", "TIBC", "IRON", "RETICCTPCT" in the last 72 hours. Sepsis Labs: No results for input(s): "PROCALCITON", "LATICACIDVEN" in the last 168 hours.  No results found for this or any previous visit (from the past 240 hours).       Radiology Studies: DG Chest Port 1 View Result Date: 04/28/2024 CLINICAL DATA:  Vomiting.  Chest pain. EXAM: PORTABLE CHEST 1 VIEW COMPARISON:  08/22/2022 FINDINGS: The cardiac silhouette, mediastinal and hilar contours are within normal limits. The lungs are clear. No infiltrates or effusions. No pneumothorax. No free air is seen under the hemidiaphragms. The bony thorax is intact. IMPRESSION: No acute cardiopulmonary findings. Electronically Signed   By: Marrian Siva M.D.   On: 04/28/2024 21:43        Scheduled Meds:  pantoprazole  (PROTONIX ) IV  40 mg Intravenous Q24H     LOS: 0 days    Time spent: 55 minutes    Sarp Vernier Loran Rock, DO [facility]  If 7PM-7AM, please contact night-coverage [email or web address] 04/29/2024, 1:32 PM

## 2024-04-29 NOTE — Transfer of Care (Signed)
 Immediate Anesthesia Transfer of Care Note  Patient: Angelica Crawford  Procedure(s) Performed: EGD (ESOPHAGOGASTRODUODENOSCOPY)  Patient Location: PACU  Anesthesia Type:General  Level of Consciousness: drowsy  Airway & Oxygen Therapy: Patient Spontanous Breathing  Post-op Assessment: Report given to RN and Post -op Vital signs reviewed and stable  Post vital signs: Reviewed and stable  Last Vitals:  Vitals Value Taken Time  BP 101/66 04/29/24 1533  Temp 37.1 C 04/29/24 1533  Pulse 78 04/29/24 1534  Resp 21 04/29/24 1534  SpO2 97 % 04/29/24 1534  Vitals shown include unfiled device data.  Last Pain:  Vitals:   04/29/24 1517  TempSrc:   PainSc: 6          Complications: No notable events documented.

## 2024-04-29 NOTE — Plan of Care (Signed)

## 2024-04-29 NOTE — Op Note (Addendum)
 [facility] Patient Name: Angelica Crawford Procedure Date: 04/29/2024 2:49 PM MRN: [id number] Date of Birth: [birth year] Attending MD: Terril Fetters , MD, [id number] CSN: [id number] Age: 38 Admit Type: Inpatient Procedure:                Upper GI endoscopy Indications:              Dysphagia, Odynophagia Providers:                Terril Fetters, MD, Graydon Lazier RN, RN, Sharlette Dayhoff                            Technician, Technician Referring MD:              Medicines:                Monitored Anesthesia Care Complications:            No immediate complications. Estimated Blood Loss:     Estimated blood loss: none. Procedure:                Pre-Anesthesia Assessment:                           - Prior to the procedure, a History and Physical                            was performed, and patient medications and                            allergies were reviewed. The patient's tolerance of                            previous anesthesia was also reviewed. The risks                            and benefits of the procedure and the sedation                            options and risks were discussed with the patient.                            All questions were answered, and informed consent                            was obtained. Prior Anticoagulants: The patient has                            taken no anticoagulant or antiplatelet agents. ASA                            Grade Assessment: III - A patient with severe                            systemic disease. After reviewing the risks and  benefits, the patient was deemed in satisfactory                            condition to undergo the procedure.                           After obtaining informed consent, the endoscope was                            passed under direct vision. Throughout the                            procedure, the patient's blood pressure, pulse, and                            oxygen  saturations were monitored continuously. The                            [id number]-[id number] ([id number]) scope was introduced through the                            mouth, and advanced to the second part of duodenum.                            The upper GI endoscopy was accomplished without                            difficulty. The patient tolerated the procedure                            well. Scope In: 3:22:47 PM Scope Out: 3:29:51 PM Total Procedure Duration: 0 hours 7 minutes 4 seconds  Findings:      Few superficial esophageal ulcers with no bleeding and no stigmata of       recent bleeding were found in the lower third of the esophagus. Biopsies       were taken with a cold forceps for histology.      Mild inflammation characterized by erythema was found in the stomach.       Biopsies were taken with a cold forceps for histology.      The duodenal bulb and second portion of the duodenum were normal. Impression:               - Esophageal ulcers with no bleeding and no                            stigmata of recent bleeding. Biopsied.                           - Gastritis. Biopsied.                           - Normal duodenal bulb and second portion of the                            duodenum. Moderate Sedation:  Per Anesthesia Care Recommendation:           - Patient has a contact number available for                            emergencies. The signs and symptoms of potential                            delayed complications were discussed with the                            patient. Return to normal activities tomorrow.                            Written discharge instructions were provided to the                            patient.                           - Soft diet.                           - Continue present medications.                           - Await pathology results.                           - Repeat upper endoscopy in 8 weeks for                            surveillance  based on pathology results.                           - Return to GI clinic at appointment to be                            scheduled.                           -Protonix  40mg  BID for 2 weeks followed by daily                            for total of 8 weeks.                           -Magic mouth wash for symptomatic relief                           -liquid Caralfate Procedure Code(s):        --- Professional ---                           [phone], Esophagogastroduodenoscopy, flexible,                            transoral; with  biopsy, single or multiple Diagnosis Code(s):        --- Professional ---                           K22.10, Ulcer of esophagus without bleeding                           K29.70, Gastritis, unspecified, without bleeding                           R13.10, Dysphagia, unspecified CPT copyright 2022 American Medical Association. All rights reserved. The codes documented in this report are preliminary and upon coder review may  be revised to meet current compliance requirements. Terril Fetters, MD Terril Fetters, MD 04/29/2024 3:34:33 PM This report has been signed electronically. Number of Addenda: 0

## 2024-04-30 ENCOUNTER — Telehealth: Payer: Self-pay | Admitting: Gastroenterology

## 2024-04-30 LAB — COMPREHENSIVE METABOLIC PANEL WITH GFR
ALT: 21 U/L (ref 0–44)
AST: 14 U/L — ABNORMAL LOW (ref 15–41)
Albumin: 3.1 g/dL — ABNORMAL LOW (ref 3.5–5.0)
Alkaline Phosphatase: 67 U/L (ref 38–126)
Anion gap: 7 (ref 5–15)
BUN: 17 mg/dL (ref 6–20)
CO2: 23 mmol/L (ref 22–32)
Calcium: 8.5 mg/dL — ABNORMAL LOW (ref 8.9–10.3)
Chloride: 106 mmol/L (ref 98–111)
Creatinine, Ser: 1.11 mg/dL — ABNORMAL HIGH (ref 0.44–1.00)
GFR, Estimated: >60 mL/min (ref >60)
Glucose, Bld: 90 mg/dL (ref 70–99)
Potassium: 3.9 mmol/L (ref 3.5–5.1)
Sodium: 136 mmol/L (ref 135–145)
Total Bilirubin: 1.4 mg/dL — ABNORMAL HIGH (ref 0.0–1.2)
Total Protein: 6.9 g/dL (ref 6.5–8.1)

## 2024-04-30 LAB — CBC
HCT: 41.7 % (ref 36.0–46.0)
Hemoglobin: 13.5 g/dL (ref 12.0–15.0)
MCH: 28.7 pg (ref 26.0–34.0)
MCHC: 32.4 g/dL (ref 30.0–36.0)
MCV: 88.5 fL (ref 80.0–100.0)
Platelets: 238 10*3/uL (ref 150–400)
RBC: 4.71 MIL/uL (ref 3.87–5.11)
RDW: 15.2 % (ref 11.5–15.5)
WBC: 6.2 10*3/uL (ref 4.0–10.5)
nRBC: 0 % (ref 0.0–0.2)

## 2024-04-30 LAB — MAGNESIUM: Magnesium: 2.1 mg/dL (ref 1.7–2.4)

## 2024-04-30 MED ORDER — PANTOPRAZOLE SODIUM 40 MG PO TBEC: 40.0000 mg | DELAYED_RELEASE_TABLET | Freq: Two times a day (BID) | ORAL | 0 refills | Status: AC

## 2024-04-30 MED ORDER — MAGIC MOUTHWASH W/LIDOCAINE: 10.0000 mL | Freq: Three times a day (TID) | ORAL | 0 refills | Status: AC

## 2024-04-30 MED ORDER — SUCRALFATE 1 GM/10ML PO SUSP: 1.0000 g | Freq: Two times a day (BID) | ORAL | 0 refills | Status: AC

## 2024-04-30 MED ORDER — PANTOPRAZOLE SODIUM 40 MG PO TBEC: 40.0000 mg | DELAYED_RELEASE_TABLET | Freq: Every day | ORAL | 0 refills | Status: AC

## 2024-04-30 NOTE — Discharge Summary (Signed)
 Physician Discharge Summary  Angelica Crawford:[id number] DOB: September 28, 1986 DOA: 04/28/2024  PCP: Perley Bradley, MD  Admit date: 04/28/2024  Discharge date: 04/30/2024  Admitted From:Home  Disposition:  Home  Recommendations for Outpatient Follow-up:  Follow up with PCP in 1-2 weeks Follow-up with GI which will be scheduled to consider repeat EGD in 8 weeks and follow-up biopsy results Remain on Protonix  twice daily for 2 weeks and then daily for another 6 weeks as prescribed Remain on Carafate as prescribed Magic mouthwash with lidocaine  as prescribed Continue other home medications as prior  Home Health: None  Equipment/Devices: None  Discharge Condition:Stable  CODE STATUS: Full  Diet recommendation: Regular diet  Brief/Interim Summary: Angelica Crawford is a 38 y.o. female with medical history significant of migraine, depression who presents to the emergency department from urgent care due to severe abdominal pain.  Patient states that she had mac & cheese yesterday, accidentally, she got one of the hotspots while swallowing and she felt burning along the throat to the stomach.  She was seen by GI and underwent EGD evaluation on 5/15 with noted esophageal ulcers with no bleeding and biopsies were performed.  She was also noted to have some gastritis.  Recommendations were to remain on soft diet which she now appears to be tolerating.  Plan will be to repeat upper endoscopy in 8 weeks based on pathology results and follow-up to GI clinic will be scheduled.  She will remain on medications as noted above.  No other acute events or concerns noted.  Discharge Diagnoses:  Principal Problem:   Esophagitis Active Problems:   Morbid obesity with BMI of 50.0-59.9, adult (HCC)   Abdominal pain   Ulcer of esophagus without bleeding  Principal discharge diagnosis: Esophageal ulcers with gastritis secondary to injuries from hot food intake.  Discharge Instructions  Discharge  Instructions     Diet - low sodium heart healthy   Complete by: As directed    Increase activity slowly   Complete by: As directed       Allergies as of 04/30/2024       Reactions   Penicillins Anaphylaxis   Sulfa Antibiotics Anaphylaxis   Vancomycin  Itching, Rash, Other (See Comments)   Throat itching   Cat Dander    Other Reaction(s): hay fever reaction   Cephalexin    Other Reaction(s): hives/swelling throat   Gabapentin  Other (See Comments)   tremors   Salicylic Acid Other (See Comments)        Medication List     TAKE these medications    Ambien  10 MG tablet Generic drug: zolpidem  1 tablet at bedtime Orally Once a day for 30 days As needed   buPROPion  150 MG 24 hr tablet Commonly known as: WELLBUTRIN  XL Take 1 tablet (150 mg total) by mouth every morning.   EPINEPHrine  0.3 mg/0.3 mL Soaj injection Commonly known as: EPI-PEN Inject 0.3 mg into the muscle as needed for anaphylaxis.   FLUoxetine  40 MG capsule Commonly known as: PROZAC  Take 80 mg by mouth daily.   ibuprofen  600 MG tablet Commonly known as: ADVIL  Take 1 tablet (600 mg total) by mouth every 6 (six) hours as needed for mild pain or moderate pain.   magic mouthwash w/lidocaine  Soln Take 10 mLs by mouth 3 (three) times daily for 10 days. Suspension contains equal amounts of Maalox Extra Strength, nystatin, diphenhydramine  and lidocaine .   Mirena  (52 MG) 20 MCG/DAY Iud Generic drug: levonorgestrel  as directed Intrauterine   pantoprazole  40  MG tablet Commonly known as: Protonix  Take 1 tablet (40 mg total) by mouth 2 (two) times daily for 14 days.   pantoprazole  40 MG tablet Commonly known as: Protonix  Take 1 tablet (40 mg total) by mouth daily. Start taking on: May 15, 2024   phentermine 37.5 MG tablet Commonly known as: ADIPEX-P 1 tablet before breakfast Orally Once a day   sucralfate 1 GM/10ML suspension Commonly known as: CARAFATE Take 10 mLs (1 g total) by mouth 2 (two) times  daily before a meal.   SUMAtriptan 100 MG tablet Commonly known as: IMITREX Take by mouth.   topiramate 100 MG tablet Commonly known as: TOPAMAX Take 100 mg by mouth daily.   Wegovy  0.25 MG/0.5ML Soaj Generic drug: Semaglutide -Weight Management Inject 0.25 mg into the skin once a week.        Follow-up Information     Perley Bradley, MD. Schedule an appointment as soon as possible for a visit in 1 week(s).   Specialty: Family Medicine Contact information: [address] [place] [address] [phone]         [facility]. Go to.   Contact information: [address] [place]  [address] [phone]               Allergies  Allergen Reactions   Penicillins Anaphylaxis   Sulfa Antibiotics Anaphylaxis   Vancomycin  Itching, Rash and Other (See Comments)    Throat itching   Cat Dander     Other Reaction(s): hay fever reaction   Cephalexin     Other Reaction(s): hives/swelling throat   Gabapentin  Other (See Comments)    tremors   Salicylic Acid Other (See Comments)    Consultations: GI   Procedures/Studies: DG Chest Port 1 View Result Date: 04/28/2024 CLINICAL DATA:  Vomiting.  Chest pain. EXAM: PORTABLE CHEST 1 VIEW COMPARISON:  08/22/2022 FINDINGS: The cardiac silhouette, mediastinal and hilar contours are within normal limits. The lungs are clear. No infiltrates or effusions. No pneumothorax. No free air is seen under the hemidiaphragms. The bony thorax is intact. IMPRESSION: No acute cardiopulmonary findings. Electronically Signed   By: Marrian Siva M.D.   On: 04/28/2024 21:43     Discharge Exam: Vitals:   04/29/24 1951 04/30/24 0257  BP: 111/70 121/86  Pulse: 68 75  Resp: 17 16  Temp: 97.9 F (36.6 C) 98 F (36.7 C)  SpO2: 99% 94%   Vitals:   04/29/24 1545 04/29/24 1601 04/29/24 1951 04/30/24 0257  BP: 117/63 106/87 111/70 121/86  Pulse: 70 72 68 75  Resp: 17 20 17 16   Temp: 97.7 F  (36.5 C) 97.6 F (36.4 C) 97.9 F (36.6 C) 98 F (36.7 C)  TempSrc:  Oral Oral Oral  SpO2: 100% 100% 99% 94%  Weight:      Height:        General: Pt is alert, awake, not in acute distress, morbidly obese Cardiovascular: RRR, S1/S2 +, no rubs, no gallops Respiratory: CTA bilaterally, no wheezing, no rhonchi Abdominal: Soft, NT, ND, bowel sounds + Extremities: no edema, no cyanosis    The results of significant diagnostics from this hospitalization (including imaging, microbiology, ancillary and laboratory) are listed below for reference.     Microbiology: No results found for this or any previous visit (from the past 240 hours).   Labs: BNP (last 3 results) No results for input(s): "BNP" in the last 8760 hours. Basic Metabolic Panel: Recent Labs  Lab 04/28/24 2015 04/28/24 2249 04/29/24 0405 04/30/24  0411  NA 139 141 140 136  K 3.5 3.3* 4.0 3.9  CL 108 106 109 106  CO2 21*  --  22 23  GLUCOSE 86 70 95 90  BUN 15 17 17 17   CREATININE 1.07* 1.10* 1.15* 1.11*  CALCIUM  9.3  --  8.9 8.5*  MG  --   --  2.3 2.1  PHOS  --   --  3.5  --    Liver Function Tests: Recent Labs  Lab 04/28/24 2015 04/29/24 0405 04/30/24 0411  AST 19 14* 14*  ALT 27 23 21   ALKPHOS 70 63 67  BILITOT 0.4 0.8 1.4*  PROT 7.2 6.7 6.9  ALBUMIN 3.5 3.1* 3.1*   Recent Labs  Lab 04/28/24 2015  LIPASE 32   No results for input(s): "AMMONIA" in the last 168 hours. CBC: Recent Labs  Lab 04/28/24 2015 04/28/24 2249 04/29/24 0405 04/30/24 0411  WBC 8.4  --  7.0 6.2  HGB 14.8 16.0* 13.8 13.5  HCT 45.8 47.0* 42.4 41.7  MCV 87.2  --  87.6 88.5  PLT 284  --  246 238   Cardiac Enzymes: No results for input(s): "CKTOTAL", "CKMB", "CKMBINDEX", "TROPONINI" in the last 168 hours. BNP: Invalid input(s): "POCBNP" CBG: No results for input(s): "GLUCAP" in the last 168 hours. D-Dimer No results for input(s): "DDIMER" in the last 72 hours. Hgb A1c No results for input(s): "HGBA1C" in the  last 72 hours. Lipid Profile No results for input(s): "CHOL", "HDL", "LDLCALC", "TRIG", "CHOLHDL", "LDLDIRECT" in the last 72 hours. Thyroid function studies No results for input(s): "TSH", "T4TOTAL", "T3FREE", "THYROIDAB" in the last 72 hours.  Invalid input(s): "FREET3" Anemia work up No results for input(s): "VITAMINB12", "FOLATE", "FERRITIN", "TIBC", "IRON", "RETICCTPCT" in the last 72 hours. Urinalysis    Component Value Date/Time   COLORURINE YELLOW 04/28/2024 2015   APPEARANCEUR CLEAR 04/28/2024 2015   APPEARANCEUR Clear 03/11/2022 1618   LABSPEC 1.026 04/28/2024 2015   PHURINE 5.0 04/28/2024 2015   GLUCOSEU NEGATIVE 04/28/2024 2015   HGBUR NEGATIVE 04/28/2024 2015   BILIRUBINUR NEGATIVE 04/28/2024 2015   BILIRUBINUR small (A) 08/28/2022 1904   BILIRUBINUR Negative 03/11/2022 1618   KETONESUR NEGATIVE 04/28/2024 2015   PROTEINUR NEGATIVE 04/28/2024 2015   UROBILINOGEN 1.0 08/28/2022 1904   NITRITE NEGATIVE 04/28/2024 2015   LEUKOCYTESUR NEGATIVE 04/28/2024 2015   Sepsis Labs Recent Labs  Lab 04/28/24 2015 04/29/24 0405 04/30/24 0411  WBC 8.4 7.0 6.2   Microbiology No results found for this or any previous visit (from the past 240 hours).   Time coordinating discharge: 35 minutes  SIGNED:   Cornelius Dill, DO [facility] 04/30/2024, 9:58 AM  If 7PM-7AM, please contact night-coverage [email or web address]

## 2024-04-30 NOTE — Plan of Care (Signed)
  Problem: Health Behavior/Discharge Planning: Goal: Ability to manage health-related needs will improve Outcome: Progressing   Problem: Clinical Measurements: Goal: Ability to maintain clinical measurements within normal limits will improve Outcome: Progressing Goal: Will remain free from infection Outcome: Progressing Goal: Diagnostic test results will improve Outcome: Progressing   Problem: Activity: Goal: Risk for activity intolerance will decrease Outcome: Progressing   Problem: Coping: Goal: Level of anxiety will decrease Outcome: Progressing   Problem: Pain Managment: Goal: General experience of comfort will improve and/or be controlled Outcome: Progressing   Problem: Safety: Goal: Ability to remain free from injury will improve Outcome: Progressing   Problem: Skin Integrity: Goal: Risk for impaired skin integrity will decrease Outcome: Progressing

## 2024-04-30 NOTE — Telephone Encounter (Signed)
 Please arrange hospital follow up for patient in 3-4 weeks with myself or Dr. Alita Irwin to arrange repeat EGD.  Julian Obey, MSN, APRN, FNP-BC, AGACNP-BC [facility] at [place] and [address]

## 2024-05-04 ENCOUNTER — Encounter (HOSPITAL_COMMUNITY): Payer: Self-pay | Admitting: Gastroenterology

## 2024-05-13 LAB — SURGICAL PATHOLOGY

## 2024-05-16 NOTE — Anesthesia Postprocedure Evaluation (Signed)
 Anesthesia Post Note  Patient: Angelica Crawford  Procedure(s) Performed: EGD (ESOPHAGOGASTRODUODENOSCOPY)  Patient location during evaluation: Phase II Anesthesia Type: General Level of consciousness: awake Pain management: pain level controlled Vital Signs Assessment: post-procedure vital signs reviewed and stable Respiratory status: spontaneous breathing and respiratory function stable Cardiovascular status: blood pressure returned to baseline and stable Postop Assessment: no headache and no apparent nausea or vomiting Anesthetic complications: no Comments: Late entry   No notable events documented.   Last Vitals:  Vitals:   04/29/24 1951 04/30/24 0257  BP: 111/70 121/86  Pulse: 68 75  Resp: 17 16  Temp: 36.6 C 36.7 C  SpO2: 99% 94%    Last Pain:  Vitals:   04/30/24 1610  TempSrc:   PainSc: 8                  Coretha Dew

## 2024-05-18 ENCOUNTER — Ambulatory Visit (INDEPENDENT_AMBULATORY_CARE_PROVIDER_SITE_OTHER): Payer: Self-pay | Admitting: Gastroenterology

## 2024-06-14 ENCOUNTER — Ambulatory Visit (INDEPENDENT_AMBULATORY_CARE_PROVIDER_SITE_OTHER): Admitting: Gastroenterology
